# Patient Record
Sex: Female | Born: 1935
Health system: Southern US, Community
[De-identification: ages and names within clinical notes are randomized; demographics above are authoritative.]

## PROBLEM LIST (undated history)

## (undated) DIAGNOSIS — C801 Malignant (primary) neoplasm, unspecified: Secondary | ICD-10-CM

## (undated) DIAGNOSIS — I773 Arterial fibromuscular dysplasia: Secondary | ICD-10-CM

## (undated) DIAGNOSIS — I341 Nonrheumatic mitral (valve) prolapse: Secondary | ICD-10-CM

## (undated) DIAGNOSIS — I10 Essential (primary) hypertension: Secondary | ICD-10-CM

## (undated) DIAGNOSIS — E079 Disorder of thyroid, unspecified: Secondary | ICD-10-CM

## (undated) HISTORY — PX: PARATHYROIDECTOMY: SHX19

## (undated) HISTORY — PX: TONSILLECTOMY: SUR1361

## (undated) HISTORY — DX: Nonrheumatic mitral (valve) prolapse: I34.1

## (undated) HISTORY — PX: APPENDECTOMY: SHX54

## (undated) HISTORY — DX: Disorder of thyroid, unspecified: E07.9

## (undated) HISTORY — DX: Arterial fibromuscular dysplasia: I77.3

## (undated) HISTORY — PX: ABDOMINAL HYSTERECTOMY: SHX81

---

## 2001-06-15 ENCOUNTER — Ambulatory Visit (HOSPITAL_COMMUNITY): Admission: RE | Admit: 2001-06-15 | Discharge: 2001-06-15 | Payer: Self-pay | Admitting: Gastroenterology

## 2001-06-15 ENCOUNTER — Encounter: Payer: Self-pay | Admitting: Gastroenterology

## 2002-10-13 ENCOUNTER — Other Ambulatory Visit: Admission: RE | Admit: 2002-10-13 | Discharge: 2002-10-13 | Payer: Self-pay | Admitting: Obstetrics & Gynecology

## 2002-11-12 ENCOUNTER — Encounter: Admission: RE | Admit: 2002-11-12 | Discharge: 2002-12-09 | Payer: Self-pay | Admitting: Internal Medicine

## 2002-11-16 ENCOUNTER — Ambulatory Visit (HOSPITAL_COMMUNITY): Admission: RE | Admit: 2002-11-16 | Discharge: 2002-11-16 | Payer: Self-pay | Admitting: Obstetrics & Gynecology

## 2002-11-16 ENCOUNTER — Encounter: Payer: Self-pay | Admitting: Obstetrics & Gynecology

## 2003-01-04 ENCOUNTER — Ambulatory Visit: Admission: RE | Admit: 2003-01-04 | Discharge: 2003-01-04 | Payer: Self-pay | Admitting: Gynecology

## 2003-11-17 ENCOUNTER — Ambulatory Visit (HOSPITAL_COMMUNITY): Admission: RE | Admit: 2003-11-17 | Discharge: 2003-11-17 | Payer: Self-pay | Admitting: Obstetrics & Gynecology

## 2003-12-23 ENCOUNTER — Encounter: Admission: RE | Admit: 2003-12-23 | Discharge: 2003-12-23 | Payer: Self-pay | Admitting: Internal Medicine

## 2004-07-04 ENCOUNTER — Ambulatory Visit: Payer: Self-pay | Admitting: *Deleted

## 2004-07-11 ENCOUNTER — Ambulatory Visit: Payer: Self-pay

## 2004-11-28 ENCOUNTER — Ambulatory Visit: Payer: Self-pay | Admitting: Cardiology

## 2005-01-14 ENCOUNTER — Ambulatory Visit (HOSPITAL_COMMUNITY): Admission: RE | Admit: 2005-01-14 | Discharge: 2005-01-14 | Payer: Self-pay | Admitting: Internal Medicine

## 2005-03-06 ENCOUNTER — Encounter
Admission: RE | Admit: 2005-03-06 | Discharge: 2005-03-06 | Payer: Self-pay | Admitting: Physical Medicine and Rehabilitation

## 2005-06-12 ENCOUNTER — Ambulatory Visit: Payer: Self-pay | Admitting: Cardiology

## 2005-11-25 ENCOUNTER — Ambulatory Visit: Payer: Self-pay | Admitting: Cardiology

## 2006-02-20 ENCOUNTER — Encounter: Payer: Self-pay | Admitting: Cardiology

## 2006-02-20 ENCOUNTER — Ambulatory Visit: Payer: Self-pay

## 2006-02-26 ENCOUNTER — Ambulatory Visit: Payer: Self-pay | Admitting: Cardiology

## 2006-05-23 ENCOUNTER — Encounter: Admission: RE | Admit: 2006-05-23 | Discharge: 2006-05-23 | Payer: Self-pay | Admitting: Internal Medicine

## 2007-01-28 ENCOUNTER — Encounter: Admission: RE | Admit: 2007-01-28 | Discharge: 2007-01-28 | Payer: Self-pay | Admitting: Internal Medicine

## 2007-05-18 ENCOUNTER — Ambulatory Visit: Payer: Self-pay | Admitting: Cardiology

## 2007-05-18 LAB — CONVERTED CEMR LAB
Chloride: 105 meq/L (ref 96–112)
GFR calc Af Amer: 91 mL/min
GFR calc non Af Amer: 75 mL/min
Potassium: 3.8 meq/L (ref 3.5–5.1)
Sodium: 143 meq/L (ref 135–145)

## 2007-11-26 ENCOUNTER — Ambulatory Visit: Payer: Self-pay | Admitting: Cardiology

## 2008-02-23 ENCOUNTER — Ambulatory Visit: Payer: Self-pay | Admitting: Cardiology

## 2008-09-26 ENCOUNTER — Encounter (INDEPENDENT_AMBULATORY_CARE_PROVIDER_SITE_OTHER): Payer: Self-pay | Admitting: *Deleted

## 2008-12-13 ENCOUNTER — Telehealth (INDEPENDENT_AMBULATORY_CARE_PROVIDER_SITE_OTHER): Payer: Self-pay | Admitting: *Deleted

## 2008-12-14 ENCOUNTER — Ambulatory Visit: Payer: Self-pay | Admitting: Cardiology

## 2008-12-14 ENCOUNTER — Ambulatory Visit: Payer: Self-pay

## 2008-12-14 ENCOUNTER — Encounter (HOSPITAL_COMMUNITY): Admission: RE | Admit: 2008-12-14 | Discharge: 2009-02-10 | Payer: Self-pay | Admitting: Cardiology

## 2009-01-05 DIAGNOSIS — I059 Rheumatic mitral valve disease, unspecified: Secondary | ICD-10-CM | POA: Insufficient documentation

## 2009-01-05 DIAGNOSIS — R079 Chest pain, unspecified: Secondary | ICD-10-CM | POA: Insufficient documentation

## 2009-01-05 DIAGNOSIS — R002 Palpitations: Secondary | ICD-10-CM | POA: Insufficient documentation

## 2009-01-06 ENCOUNTER — Ambulatory Visit: Payer: Self-pay | Admitting: Cardiology

## 2009-03-08 ENCOUNTER — Encounter: Admission: RE | Admit: 2009-03-08 | Discharge: 2009-03-08 | Payer: Self-pay | Admitting: Internal Medicine

## 2009-11-28 ENCOUNTER — Telehealth: Payer: Self-pay | Admitting: Cardiology

## 2009-12-12 ENCOUNTER — Ambulatory Visit: Payer: Self-pay | Admitting: Cardiology

## 2010-01-08 ENCOUNTER — Encounter: Payer: Self-pay | Admitting: Cardiology

## 2010-04-01 ENCOUNTER — Encounter: Payer: Self-pay | Admitting: Obstetrics & Gynecology

## 2010-04-10 NOTE — Assessment & Plan Note (Signed)
Summary: rov. mvp/ per kelly on 9/20 / gd   CC:  pt complains of anxiety.  History of Present Illness: Ms. Ashley Washington is a pleasant female, who has a history of palpitations, fibromuscular dysplasia of her right renal artery, and chest pain.  Most recent Myoview in October of 2010 showed normal LV function and normal perfusion.  Her last renal Dopplers in February 2009 showed mildly elevated velocities on the right with stenosis equal to or less than 60% and normal left. Last echocardiogram in December 2007 showed normal LV function, mild left atrial enlargement and a small pericardial effusion. There is mild tricuspid regurgitation. I last saw her in October of 2010. Since then she describes feelings of "anxiety" intermittently. She finds this difficult to describe. She does not have dyspnea on exertion, orthopnea, PND, pedal edema. She occasionally feels pain on the left side of her chest when she lays on that side. She does not have exertional chest pain. She wonders whether her atenolol is causing anxiety.  Current Medications (verified): 1)  Atenolol 25 Mg Tabs (Atenolol) .... Take One Tablet By Mouth Daily 2)  Co Q-10 30 Mg  Caps (Coenzyme Q10) .Marland Kitchen.. 1 Tab By Mouth Once Daily 3)  Multivitamins   Tabs (Multiple Vitamin) .Marland Kitchen.. 1 Tab By Mouth Once Daily 4)  Vitamin C 500 Mg Tabs (Ascorbic Acid) .... Tab By Mouth Once Daily 5)  Omega-3 350 Mg Caps (Omega-3 Fatty Acids) .Marland Kitchen.. 1 Tab By Mouth Once Daily 6)  Calcium 500 Mg Tabs (Calcium Carbonate) .Marland Kitchen.. 1 Tab By Mouth Once Daily 7)  Vitamin D 400 Unit Caps (Cholecalciferol) .Marland Kitchen.. 1 Tab By Mouth Once Daily  Allergies: No Known Drug Allergies  Past History:  Past Medical History: MITRAL VALVE PROLAPSE (ICD-424.0) fibromuscular dysplasia of renal artery left kidney compromised by large cyst.   Social History: Reviewed history from 01/06/2009 and no changes required.  The patient is married.  Her husband has recurrent prostate  cancer and is  currently undergoing hormonal therapy. Tobacco Use - No.   Review of Systems       no fevers or chills, productive cough, hemoptysis, dysphasia, odynophagia, melena, hematochezia, dysuria, hematuria, rash, seizure activity, orthopnea, PND, pedal edema, claudication. Remaining systems are negative.   Vital Signs:  Patient profile:   77 year old female Height:      64 inches Weight:      118 pounds BMI:     20.33 Pulse rate:   63 / minute Resp:     12 per minute BP sitting:   120 / 70  (left arm)  Vitals Entered By: Kem Parkinson (December 12, 2009 9:57 AM)  Physical Exam  General:  Well-developed well-nourished in no acute distress.  Anxious appearing Skin is warm and dry.  HEENT is normal.  Neck is supple. No thyromegaly.  Chest is clear to auscultation with normal expansion.  Cardiovascular exam is regular rate and rhythm.  Abdominal exam nontender or distended. No masses palpated. Extremities show no edema. neuro grossly intact    EKG  Procedure date:  12/12/2009  Findings:      Sinus rhythm at a rate 63. First Degree AV Block. Cannot Rule Out Prior anterior Infarct Versus Lead Reversal. No ST Changes.  Impression & Recommendations:  Problem # 1:  RENAL ARTERY STENOSIS (ICD-440.1) Repeat renal Dopplers. Orders: Renal Artery Duplex (Renal Artery Duplex)  Problem # 2:  MITRAL VALVE PROLAPSE (ICD-424.0) Repeat echocardiogram. Her updated medication list for this problem includes:  Atenolol 25 Mg Tabs (Atenolol) .Marland Kitchen... Take one tablet by mouth daily  Her updated medication list for this problem includes:    Atenolol 25 Mg Tabs (Atenolol) .Marland Kitchen... Take one tablet by mouth daily  Problem # 3:  PALPITATIONS (ICD-785.1) She is concerned that the atenolol may be causing feelings of anxiety. I explained that this is not a typical side effect. We discussed discontinuing that medication and substituting a different beta blocker. I explained that discontinuing beta  blockers may cause worsening palpitations. For now we will continue with her atenolol. Her updated medication list for this problem includes:    Atenolol 25 Mg Tabs (Atenolol) .Marland Kitchen... Take one tablet by mouth daily  Orders: EKG w/ Interpretation (93000) Echocardiogram (Echo)  Problem # 4:  CHEST PAIN (ICD-786.50) Symptoms atypical. No further ischemia evaluation. Her updated medication list for this problem includes:    Atenolol 25 Mg Tabs (Atenolol) .Marland Kitchen... Take one tablet by mouth daily  Patient Instructions: 1)  Your physician recommends that you schedule a follow-up appointment in: year with dr Jens Som 2)  Your physician recommends that you continue on your current medications as directed. Please refer to the Current Medication list given to you today. 3)  Your physician has requested that you have a renal artery duplex. During this test, an ultrasound is used to evaluate blood flow to the kidneys. Allow one hour for this exam. Do not eat after midnight the day before and avoid carbonated beverages. Take your medications as you usually do. 4)  Your physician has requested that you have an echocardiogram.  Echocardiography is a painless test that uses sound waves to create images of your heart. It provides your doctor with information about the size and shape of your heart and how well your heart's chambers and valves are working.  This procedure takes approximately one hour. There are no restrictions for this procedure.

## 2010-04-10 NOTE — Progress Notes (Signed)
Summary: had rapid heartbeats this am  Phone Note Call from Patient   Caller: Patient [phone] Reason for Call: Talk to Nurse Summary of Call: rapid heartbeats this am about 5am, has subsided no other symptoms, but wants an appt today or tomorrow, will be going out of town and wants to make sure she's alright-pls advise Initial call taken by: Glynda Jaeger,  November 28, 2009 8:45 AM  Follow-up for Phone Call        Tierd feeling in chest and anxious feeling over the last couple of weeks.  Fast heart beat this morning that lasted .  Got up walked around went back to bed dozed off relaxed and felt ok.  Going out of town Magdalene Molly will be back next [place] and is due follow up.  Will see PA tomorow and Nishan on 12/12/09. She requested to see PA tomorrow i felt she would be ok until 12/12/09 Dennis Bast, RN, BSN  November 28, 2009 11:48 AM

## 2010-04-10 NOTE — Miscellaneous (Signed)
Summary: Appointment Canceled  Appointment status changed to canceled by [employer] on 01/08/2010 10:12 AM.  Cancellation Comments --------------------- ZOXW/960.1/MCR/NO PREC. REQ/RENAL @ 10:00/SZAF  Appointment Information ----------------------- Appt Type:  CARDIOLOGY ANCILLARY VISIT      Date:  Wednesday, January 10, 2010      Time:  10:30 AM for 60 min   Urgency:  Routine   Made By:  Pearson Grippe  To Visit:  [id number]    Reason:  AVWU/981.1/MCR/NO PREC. REQ/RENAL @ 10:00/SZAF  Appt Comments ------------- -- 01/08/10 10:12: (CEMR) CANCELED -- XBJY/782.1/MCR/NO PREC. REQ/RENAL @ 10:00/SZAF -- 12/12/09 11:39: (CEMR) BOOKED -- Routine CARDIOLOGY ANCILLARY VISIT at 01/10/2010 10:30 AM for 60 min ECHO/785.1/MCR/NO PREC. REQ/RENAL @ 10:00/SZAF -- 12/12/09 10:

## 2010-04-11 ENCOUNTER — Encounter: Payer: Self-pay | Admitting: Internal Medicine

## 2010-07-24 NOTE — Assessment & Plan Note (Signed)
[facility]                            CARDIOLOGY OFFICE NOTE   Ashley Washington, Ashley Washington                    MRN:          [id number]  DATE:05/18/2007                            DOB:          [birth year]    HISTORY OF PRESENT ILLNESS:  Ashley Washington is a very pleasant 77 year old  female who I have seen the past for palpitations, fibromuscular  dysplasia of her right renal artery and chest pain.  She did have a  Myoview performed on March 17, 2003.  There was no ischemia and her  ejection fraction was 70%.  She also had an echocardiogram last  performed on February 20, 2006.  LV function was normal.  The left  atrium was mildly dilated and there was mild tricuspid regurgitation.  There was a small pericardial effusion.  Her most recent renal Dopplers  were performed on May 07, 2007.  The right mid renal artery and  elevated velocity consistent with equal to or less than 60% reduction in  the left renal artery was normal.  Since that time, she is doing  reasonably well.  She denies any dyspnea, chest pain or pedal edema.  She does occasionally have palpitations but these are controlled with  atenolol.  We had discussed trying Toprol in the past, but she instead  continue with her atenolol.  She does state that she has had difficulty  sleeping at times and wonders whether it may be related to her atenolol.   MEDICATIONS:  1. Atenolol 25 mg p.o. daily.  2. Vitamin C 500 mg daily.  3. Coenzyme Q.  4. Multivitamin.  5. Vitamin D.  6. Omega-3.  7. Calcium.   PHYSICAL EXAMINATION:  VITAL SIGNS:  Blood pressure of 130/80 and pulse  is 63.  She weighs 124 pounds.  HEENT:  Normal.  NECK:  Supple.  CHEST:  Clear.  CARDIOVASCULAR:  Regular rate and rhythm.  There is 2/6 systolic  ejection murmur at the left sternal border.  ABDOMEN:  Shows no tenderness.  EXTREMITIES:  Show no edema.   STUDIES:  Echocardiogram shows a sinus rhythm at a rate of 63.   The axis  is normal.  There are no ST changes noted.   DIAGNOSES:  1. Palpitations.  Ashley Washington will continue on atenolol 25 mg p.o.      daily.  We did discuss changing beta-blockers to see if this would      improve her sleep patterns.  However, she states that the atenolol      is helping with her palpitations, and would like to continue with      that.  We will therefore make no changes and continue with 25 mg      p.o. daily.  She will contact us if she would be interested in      doing that.  2. Fibromuscular dysplasia of her right renal artery.  We will check a      B-met today.  We will plan to repeat her renal Dopplers in one      year.  3. History of left kidney compromised  by a large cyst.  4. History of chest pain.  This continues, but only when she lies on      her left side.  It does not sound cardiac in etiology.   I will see her back in one year.     Madolyn Frieze Jens Som, MD, [facility]  Electronically Signed    BSC/MedQ  DD: 05/18/2007  DT: 05/19/2007  Job #: [id number]

## 2010-07-24 NOTE — Assessment & Plan Note (Signed)
[facility]                            CARDIOLOGY OFFICE NOTE   BRYCELYNN, STAMPLEY                    MRN:          [id number]  DATE:11/26/2007                            DOB:          1933-02-19    Mrs. Rahm is a very pleasant female that I follow for history of  palpitations, fibromuscular dysplasia of her right renal artery, and  history of chest pain.  Her most recent Myoview was performed on March 17, 2003.  At that time her ejection fraction was 60%.  There was normal  perfusion.  Her last renal Doppler was performed on May 07, 2007.  She was found to have mildly elevated velocity consistent will equal to  or less than 60% diameter reduction on the right and normal left.  Her  last echo in December 2007 showed normal LV function and trivial mitral  regurgitation.  There was a small pericardial effusion.  Since I last  saw her, she has had 2 separate episodes of pain in her right lower  chest area and rib area.  The pain is described as a sharp pain.  It  does not occur with exertion nor is it pleuritic.  It does not occur  after eating.  There is no associated nausea, vomiting, shortness of  breath, or diaphoresis.  It last approximately 5 minutes.  It radiates  to the right shoulder.  She is not having exertional chest pain or is  she have dyspnea on exertion.  She is not having any colic stools.   Medications include vitamin C, coenzyme Q, multivitamin, vitamin D,  omega-3, calcium, and atenolol 25 mg p.o. daily.   PHYSICAL EXAMINATION:  VITAL SIGNS:  Blood pressure of 135/73 and pulse  is 65.  HEENT:  Normal.  NECK:  Supple.  CHEST:  Clear.  CARDIOVASCULAR:  Regular rhythm.  ABDOMEN:  Shows no tenderness.  There is no tenderness in the right  upper quadrant.  EXTREMITIES:  Show no edema.   Her electrocardiogram shows a sinus rhythm at a rate of 64.  There is a  first-degree AV block.  There are no ST changes noted.   DIAGNOSES:  1. Atypical chest pain - Mrs. Quirino's symptoms are atypical.  We      will schedule her to have a Myoview for risk stratification.  I      also considered possible gallbladder disease, but she is not here      in the right upper quadrant.  This could be evaluated further if      her symptoms persist.  2. History of palpitations - she will continue on her atenolol.  3. Fibromuscular dysplasia of right renal artery - she will need      followup renal Dopplers in February.  Note, her renal function has      been normal.  4. History of left kidney compromised by large cyst.   We will see her back in 1 year.     Madolyn Frieze Jens Som, MD, [facility]  Electronically Signed    BSC/MedQ  DD: 11/26/2007  DT: 11/27/2007  Job #:  [id number] 

## 2010-07-24 NOTE — Assessment & Plan Note (Signed)
[facility]                            CARDIOLOGY OFFICE NOTE   EGAN, SAHLIN                    MRN:          [id number]  DATE:02/23/2008                            DOB:          04-Dec-1932    Ms. Verdi is a pleasant female, who has a history of palpitations,  fibromuscular dysplasia of her right renal artery, and chest pain.  Most  recent Myoview in January 2005 showed normal perfusion and normal LV  function.  Her last renal Dopplers in February 2009 showed mildly  elevated velocities on the right with stenosis equal to or less than 60%  and normal left.  Since I last saw her, she has done reasonably well.  However, over the past 2 days, she has had chest pain.  It is in the  left chest area.  It is described as ache.  The pain increases with  certain movements.  She did notice that when she felt like she pulled  something in her back as well.  Note, it is not associated with  shortness of breath, nausea, vomiting, or diaphoresis.  It does not  radiate.  She otherwise has not had exertional chest pain nor did she  have dyspnea on exertion, orthopnea, PND, or increased pedal edema.   Her medications at present include:  1. Vitamin C.  2. Coenzyme Q.  3. Multivitamin.  4. Vitamin D.  5. Omega-3.  6. Calcium.  7. Atenolol 25 mg p.o. daily.   PHYSICAL EXAMINATION:  VITAL SIGNS:  Blood pressure of 106/66 and a  pulse of 65.  She weighs 121 pounds.  HEENT:  Normal.  NECK:  Supple.  CHEST:  Clear.  CARDIOVASCULAR:  Regular rate and rhythm.  Her chest pain is reproduced  with palpation.  ABDOMEN:  No tenderness.  EXTREMITIES:  No edema.   Electrocardiogram shows sinus rhythm at a rate of 65.  The axis is  normal.  A prior inferior infarct cannot be excluded.   DIAGNOSES:  1. Atypical chest pain - Ms. Brobeck's symptoms are most consistent      with musculoskeletal pain.  We will treat her with a nonsteroidal.      We will also  schedule her to have a Myoview as she did not show for      her previous one which was scheduled in September.  If it is      normal, we will not pursue further cardiac workup.  2. History of palpitations - she will continue on atenolol.  3. Fibromuscular dysplasia of the right renal artery.  4. History of left kidney compromised by large cyst.   We will see her back in 1 year.     Madolyn Frieze Jens Som, MD, [facility]  Electronically Signed    BSC/MedQ  DD: 02/23/2008  DT: 02/24/2008  Job #: [id number]

## 2010-07-27 NOTE — Assessment & Plan Note (Signed)
[facility]                            CARDIOLOGY OFFICE NOTE   Ashley Washington, Ashley Washington                    MRN:          [id number]  DATE:02/26/2006                            DOB:          [birth year]    Ashley Washington returns for followup today.  She has a history of question  of mitral valve prolapse and recurrent palpitations.  Since I last saw  her, she is having worse palpitations.  These occur at night and are  described as skipping.  She also has pain in her chest when she lies  on her left side.  These have been going on for years.  There is no  syncope.  She does not have exertional chest pain or dyspnea on  exertion.   MEDICATIONS:  Vitamin C, coenzyme Q, multivitamin, vitamin D, omega-3,  calcium, Tenormin 25 mg p.o. daily.   PHYSICAL EXAMINATION:  Shows a blood pressure of 131/78.  Her pulse is  73.  She weighs 128 pounds.  NECK:  Supple with no bruits.  CHEST:  Clear.  CARDIOVASCULAR EXAM:  Regular rate and rhythm.  EXTREMITIES:  Show no edema.  An echocardiogram on February 20, 2006 showed normal LV function.  There  was trivial mitral regurgitation and mild left atrial enlargement.  There was a small pericardial effusion.   An EKG today shows a sinus rhythm at a rate of 66 with no ST changes.   DIAGNOSES:  1. Continued palpitations.  2. Normal left ventricular function.  3. Fibromuscular dysplasia of her right renal artery.  4. History of left kidney compromise by large cyst.  5. Chest pain.   PLAN:  Ashley Washington is having worsening palpitations.  However, she does  not want to increase her Atenolol, as she is concerned about potential  side effects.  She asked about other potential beta blockers, and we  will try Toprol 25 mg p.o. daily to see if she tolerates this better and  improves her palpitations.  I have asked her to also take this at night  to see if this will improve potential side effects.  Her echocardiogram  shows  preserved LV function.  We will increase her medicines as  tolerated.  If her palpitations persist, then we will plan to proceed  with a CardioNet  monitor.  She will need followup renal Dopplers in September 2008.  I  will see her back in 6 months.     Madolyn Frieze Jens Som, MD, [facility]  Electronically Signed    BSC/MedQ  DD: 02/26/2006  DT: 02/26/2006  Job #: [id number]   cc:   Loraine Leriche A. Perini, M.D.

## 2010-07-27 NOTE — Consult Note (Signed)
**Note Ashley-Identified via Obfuscation** Ashley Washington, Ashley Washington                       ACCOUNT NO.:  [id number]   MEDICAL RECORD NO.:  [id number]                   PATIENT TYPE:  OUT   LOCATION:  GYN                                  FACILITY:  [facility]   PHYSICIAN:  Ashley Washington, M.D.         DATE OF BIRTH:  May 27, 1932   DATE OF CONSULTATION:  01/04/2003  DATE OF DISCHARGE:                                   CONSULTATION   This is a 77 year old white married female seen in consultation at the  request of Ashley Washington regarding ovarian cysts.   HISTORY OF PRESENT ILLNESS:  The patient has had ovarian cysts over the past  two years and has been followed with serial ultrasounds and CA125 values.  The CA125 values have ranged from 5-7 units/mL.  The most recent ultrasound  of the ovary obtained on November 16, 2002, showed a normal right ovary, a 1  cm right paraovarian cyst, and a 5 x 4.3 x 3.6 cm left ovarian cyst which  contained a single thin septation and three tiny mural nodules.  Doppler  analysis of the blood flow was normal.  The patient has no pelvic pain or  pressure, GI or GU symptoms.  She does have some low back pain associated  with scoliosis and arthritis.  She has no family history of ovarian cancer.   PAST MEDICAL ILLNESSES:  1. Osteoporosis.  2. Mitral valve prolapse (prophylactic antibiotics are recommended).  3. Scoliosis.   PAST SURGICAL HISTORY:  1. Total abdominal hysterectomy in 1977 for uterine fibroids.  2. In 1991, resection of parathyroid gland.  3. Tonsillectomy and adenoidectomy.  4. Appendectomy.   DRUG ALLERGIES:  None.   CURRENT MEDICATIONS:  1. Climara patch 0.05 mg.  2. Atenolol.   FAMILY HISTORY:  Negative for gynecologic, breast, or colon cancer.   SOCIAL HISTORY:  The patient is married.  Her husband has recurrent prostate  cancer and is currently undergoing hormonal therapy.   PHYSICAL EXAM:  VITAL SIGNS:  Height 5 feet 4, weight 125 pounds.  Blood  pressure  138/78, pulse 60, respiratory rate 16.  GENERAL:  The patient is a healthy white female in no acute distress.  HEENT:  Negative.  NECK:  Supple.  Without thyromegaly.  There is no supraclavicular or  inguinal adenopathy.  ABDOMEN:  Soft, nontender.  No masses, organomegaly, ascites, or hernias are  noted.  She has a well-healed appendectomy scar and low Pfannenstiel scar.  PELVIC:  EGBUS, vagina, bladder, urethra are normal.  Cervix and uterus  surgically absent.  Bimanual and rectovaginal exams reveal no masses,  induration, or nodularity.   IMPRESSION:  Ovarian cyst, outlined above, which has only changed slightly  over the past two years.   I had a lengthy discussion with the patient regarding the potential  etiologies for this ovarian cyst.  I believe it is a benign ovarian neoplasm  which is asymptomatic.  The options of surgical  resection versus observation  were discussed at length.  Given that I believe this is most likely benign,  I would feel comfortable following it, and the patient certainly indicates  strongly that she would prefer to avoid surgery if at all possible.  I  would, therefore, recommend she have repeat ultrasound in one year and have  repeat CA125 at that time.   We also had a lengthy discussion regarding her hormone replacement therapy,  the pros and cons and benefits.  Basically, she is unable to tolerate her  life without having estrogen in her system.  In addition, she is aware of  the benefits of reducing osteoporosis, vaginal atrophy, and a general sense  of well being.   Finally, if the patient did come to surgery, I have recommended that she  reconsult Ashley Washington regarding management of her incontinence as well.   The patient will return to the care of Ashley Washington, but I would be happy to see  her again in the future.                                               Ashley Washington, M.D.    DC/MEDQ  D:  01/04/2003  T:  01/04/2003  Job:   [id number]   cc:   Ashley Washington, M.D.  [address]  [place]  [place] [address]  Fax: [phone]   Ashley Washington, R.N.  [phone] [address]  [place], [place] [address]   Ashley Washington, M.D.  [address]  [place]  [place] [address]  Fax: [phone]

## 2010-07-27 NOTE — Assessment & Plan Note (Signed)
[facility]                              CARDIOLOGY OFFICE NOTE   Ashley Washington                    MRN:          [id number]  DATE:11/25/2005                            DOB:          Feb 20, 1933    Ashley Washington is a pleasant female who has a history of mitral valve prolapse  and palpitations.  Since I last saw her, her palpitations have improved.  I  know we had discussed discontinuing her atenolol but she did change to  Tenormin and apparently is tolerating this better with less fatigue.  She  also has had chest x-rays that showed question of small effusions or pleural  thickening.  She denies any dyspnea on exertion, orthopnea or PND, pedal  edema, presyncope, syncope.  She has not had exertional chest pain and she  exercises routinely.  She did have some vague discomfort in her chest  several months ago of unclear etiology.   MEDICATIONS:  At present, include:  1. Vitamin C.  2. Coenzyme-Q.  3. Multivitamin.  4. Vitamin D.  5. Omega-3.  6. Calcium.  7. Tenormin 25 mg p.o. daily.   PHYSICAL EXAM:  Shows a blood pressure of 130/72 and a pulse of 59.  She  weighs 125 pounds.  NECK:  Supple and I cannot appreciate bruits.  CHEST:  Clear.  CARDIOVASCULAR:  Exam shows a regular rate and rhythm.  There is a 2/6  systolic murmur at the apex.  ABDOMEN:  Benign.  EXTREMITIES:  Show no edema.   Her electrocardiogram shows a normal sinus rhythm at a rate of 59.  There is  a first degree AV block.  There were no ST changes noted.   DIAGNOSES:  1. History of palpitations.  2. History of preserved left ventricular function.  3. History of mild pulmonary hypertension.  4. History of fibromuscular dysplasia of her renal artery.  5. History of left kidney compromise by large cyst.  6. Chronic palpitations.   PLAN:  [name] has improved from a palpitation standpoint and we will  continue with her present dose of Tenormin.  She was  concerned about the  possibility of small bilateral effusions but there is no dyspnea and I did  review the chest x-ray today and I am not convinced she has effusions.  We  will check a BMET today to follow her potassium and renal function as well  as a BNP.  She will need follow up renal Dopplers in approximately 1 year.  We did discuss the possibility of a stress test today for her chest pain.  However, her symptoms are somewhat vague and nonspecific and she exercises  routinely without chest pain.  Electrocardiogram was also normal.  We have  elected to continue with close observation.  Note she did have a Myoview in  January 2005 that showed normal perfusion and normal LV function.  She will  see [place] back in 12 months or sooner if necessary.  Madolyn Frieze Jens Som, MD, [facility]    BSC/MedQ  DD:  11/25/2005  DT:  11/26/2005  Job #:  [id number]   cc:   Loraine Leriche A. Perini, M.D.

## 2010-10-04 ENCOUNTER — Telehealth: Payer: Self-pay | Admitting: Cardiology

## 2010-10-04 DIAGNOSIS — I701 Atherosclerosis of renal artery: Secondary | ICD-10-CM

## 2010-10-04 DIAGNOSIS — I059 Rheumatic mitral valve disease, unspecified: Secondary | ICD-10-CM

## 2010-10-04 NOTE — Telephone Encounter (Signed)
Pt calling to say she failed to follow through with an echo and wanted to rs , I don't see an order for an echo, can he get one

## 2010-10-04 NOTE — Telephone Encounter (Signed)
Spoke with pt, when she was last seen by dr Jens Som, he ordered an echo and renal artery dopplers. The pt never had this testing done. She is now having swelling in her ankles she has never had before. She also states she is more fatiqued than usual. She wants to know if she needs to get the ultasounds done or be seen. Will forward for dr Jens Som review Ashley Washington

## 2010-10-04 NOTE — Telephone Encounter (Signed)
Proceed with studies as previously ordered and then fu. Ashley Washington

## 2010-10-10 ENCOUNTER — Other Ambulatory Visit (HOSPITAL_COMMUNITY): Payer: Self-pay | Admitting: Obstetrics & Gynecology

## 2010-10-10 ENCOUNTER — Telehealth: Payer: Self-pay | Admitting: Cardiology

## 2010-10-10 DIAGNOSIS — Z1231 Encounter for screening mammogram for malignant neoplasm of breast: Secondary | ICD-10-CM

## 2010-10-10 NOTE — Telephone Encounter (Signed)
Spoke with pt, renals and echo scheduled and then f/u with crenshaw Ashley Washington

## 2010-10-10 NOTE — Telephone Encounter (Signed)
See phone note Ashley Washington

## 2010-10-10 NOTE — Telephone Encounter (Signed)
Per pt call, pt returning call to [facility]. Please return pt call.

## 2010-10-17 ENCOUNTER — Encounter: Payer: Self-pay | Admitting: Cardiology

## 2010-10-19 ENCOUNTER — Ambulatory Visit (HOSPITAL_COMMUNITY)
Admission: RE | Admit: 2010-10-19 | Discharge: 2010-10-19 | Disposition: A | Discharge status: Home / Self Care | Payer: Medicare Other | Source: Ambulatory Visit | Attending: Obstetrics & Gynecology | Admitting: Obstetrics & Gynecology

## 2010-10-19 DIAGNOSIS — Z1231 Encounter for screening mammogram for malignant neoplasm of breast: Secondary | ICD-10-CM | POA: Insufficient documentation

## 2010-11-01 ENCOUNTER — Encounter (INDEPENDENT_AMBULATORY_CARE_PROVIDER_SITE_OTHER): Payer: Medicare Other | Admitting: Cardiology

## 2010-11-01 ENCOUNTER — Ambulatory Visit (HOSPITAL_COMMUNITY): Payer: Medicare Other | Attending: Cardiology | Admitting: Radiology

## 2010-11-01 DIAGNOSIS — I059 Rheumatic mitral valve disease, unspecified: Secondary | ICD-10-CM | POA: Insufficient documentation

## 2010-11-01 DIAGNOSIS — I701 Atherosclerosis of renal artery: Secondary | ICD-10-CM

## 2010-11-01 DIAGNOSIS — R5381 Other malaise: Secondary | ICD-10-CM | POA: Insufficient documentation

## 2010-11-01 DIAGNOSIS — I079 Rheumatic tricuspid valve disease, unspecified: Secondary | ICD-10-CM | POA: Insufficient documentation

## 2010-11-01 DIAGNOSIS — R5383 Other fatigue: Secondary | ICD-10-CM | POA: Insufficient documentation

## 2010-11-01 DIAGNOSIS — I700 Atherosclerosis of aorta: Secondary | ICD-10-CM

## 2010-11-01 DIAGNOSIS — M7989 Other specified soft tissue disorders: Secondary | ICD-10-CM | POA: Insufficient documentation

## 2010-11-05 ENCOUNTER — Telehealth: Payer: Self-pay | Admitting: Cardiology

## 2010-11-05 NOTE — Telephone Encounter (Signed)
Returning call back. 

## 2010-11-07 NOTE — Telephone Encounter (Signed)
Spoke with pt, aware of test results Ashley Washington  

## 2010-11-14 ENCOUNTER — Ambulatory Visit (INDEPENDENT_AMBULATORY_CARE_PROVIDER_SITE_OTHER): Payer: Medicare Other | Admitting: Cardiology

## 2010-11-14 ENCOUNTER — Encounter: Payer: Self-pay | Admitting: Cardiology

## 2010-11-14 VITALS — BP 121/69 | HR 58 | Resp 12 | Ht 66.0 in | Wt 115.0 lb

## 2010-11-14 DIAGNOSIS — R002 Palpitations: Secondary | ICD-10-CM

## 2010-11-14 DIAGNOSIS — I701 Atherosclerosis of renal artery: Secondary | ICD-10-CM

## 2010-11-14 DIAGNOSIS — R079 Chest pain, unspecified: Secondary | ICD-10-CM

## 2010-11-14 DIAGNOSIS — I059 Rheumatic mitral valve disease, unspecified: Secondary | ICD-10-CM

## 2010-11-14 NOTE — Assessment & Plan Note (Signed)
Her symptoms not consistent with ischemia. They have been present previously as well. No further workup.

## 2010-11-14 NOTE — Progress Notes (Signed)
HPI: Ashley Washington is a pleasant female, who has a history of palpitations, fibromuscular dysplasia of her right renal artery, and chest pain.  Most recent Myoview in October of 2010 showed normal LV function and normal perfusion.  Her last renal Dopplers in August of 2012 showed 1-59% on the right and normal left. Large cyst in left kidney. Last echocardiogram in August of 2012 showed normal LV function, mild to moderate TR and a trivial pericardial effusion. I last saw her in October of 2011.  Since then there is no dyspnea on exertion, orthopnea, PND or pedal edema. She continues to feel an occasional skip no sustained palpitations. She has occasional pain with laying on her left side but no exertional chest pain.  Current Outpatient Prescriptions  Medication Sig Dispense Refill  . atenolol (TENORMIN) 25 MG tablet 1/2 tab po qd      . calcium carbonate (TUMS - DOSED IN MG ELEMENTAL CALCIUM) 500 MG chewable tablet Chew 1 tablet by mouth daily.        Marland Kitchen co-enzyme Q-10 30 MG capsule Take 30 mg by mouth daily.        . Multiple Vitamin (MULTIVITAMIN PO) Take 1 capsule by mouth daily.        . Omega-3 350 MG CAPS Take 1 capsule by mouth daily.        . vitamin C (ASCORBIC ACID) 500 MG tablet Take 500 mg by mouth daily.           Past Medical History  Diagnosis Date  . Mitral valve prolapse   . Fibromuscular dysplasia of renal artery     No past surgical history on file.  History   Social History  . Marital Status: Married    Spouse Name: N/A    Number of Children: N/A  . Years of Education: N/A   Occupational History  . Not on file.   Social History Main Topics  . Smoking status: Never Smoker   . Smokeless tobacco: Not on file  . Alcohol Use: Not on file  . Drug Use: Not on file  . Sexually Active: Not on file   Other Topics Concern  . Not on file   Social History Narrative  . No narrative on file    ROS: no fevers or chills, productive cough, hemoptysis, dysphasia,  odynophagia, melena, hematochezia, dysuria, hematuria, rash, seizure activity, orthopnea, PND, pedal edema, claudication. Remaining systems are negative.  Physical Exam: Well-developed well-nourished in no acute distress.  Skin is warm and dry.  HEENT is normal.  Neck is supple. No thyromegaly.  Chest is clear to auscultation with normal expansion.  Cardiovascular exam is regular rate and rhythm. 2/6 systolic murmur left sternal border. Abdominal exam nontender or distended. No masses palpated. Extremities show no edema. neuro grossly intact  ECG NSR with first degree AV block.

## 2010-11-14 NOTE — Assessment & Plan Note (Signed)
No change in recent renal Dopplers.

## 2010-11-14 NOTE — Assessment & Plan Note (Signed)
Well-controlled. Continue beta blocker.

## 2010-11-14 NOTE — Patient Instructions (Signed)
Your physician wants you to follow-up in: ONE YEAR You will receive a reminder letter in the mail two months in advance. If you don't receive a letter, please call our office to schedule the follow-up appointment. ONE YEAR

## 2010-11-14 NOTE — Assessment & Plan Note (Signed)
I will continue beta blocker.

## 2011-04-24 DIAGNOSIS — D485 Neoplasm of uncertain behavior of skin: Secondary | ICD-10-CM | POA: Diagnosis not present

## 2011-04-24 DIAGNOSIS — L719 Rosacea, unspecified: Secondary | ICD-10-CM | POA: Diagnosis not present

## 2011-04-24 DIAGNOSIS — D1801 Hemangioma of skin and subcutaneous tissue: Secondary | ICD-10-CM | POA: Diagnosis not present

## 2011-04-24 DIAGNOSIS — D2370 Other benign neoplasm of skin of unspecified lower limb, including hip: Secondary | ICD-10-CM | POA: Diagnosis not present

## 2011-04-24 DIAGNOSIS — L819 Disorder of pigmentation, unspecified: Secondary | ICD-10-CM | POA: Diagnosis not present

## 2011-04-24 DIAGNOSIS — L570 Actinic keratosis: Secondary | ICD-10-CM | POA: Diagnosis not present

## 2011-04-24 DIAGNOSIS — D239 Other benign neoplasm of skin, unspecified: Secondary | ICD-10-CM | POA: Diagnosis not present

## 2011-05-08 DIAGNOSIS — H4050X0 Glaucoma secondary to other eye disorders, unspecified eye, stage unspecified: Secondary | ICD-10-CM | POA: Diagnosis not present

## 2011-05-08 DIAGNOSIS — H2510 Age-related nuclear cataract, unspecified eye: Secondary | ICD-10-CM | POA: Diagnosis not present

## 2011-05-20 DIAGNOSIS — H0019 Chalazion unspecified eye, unspecified eyelid: Secondary | ICD-10-CM | POA: Diagnosis not present

## 2011-06-03 DIAGNOSIS — H04129 Dry eye syndrome of unspecified lacrimal gland: Secondary | ICD-10-CM | POA: Diagnosis not present

## 2011-06-27 DIAGNOSIS — H903 Sensorineural hearing loss, bilateral: Secondary | ICD-10-CM | POA: Diagnosis not present

## 2011-07-01 DIAGNOSIS — H01029 Squamous blepharitis unspecified eye, unspecified eyelid: Secondary | ICD-10-CM | POA: Diagnosis not present

## 2011-07-15 DIAGNOSIS — H01029 Squamous blepharitis unspecified eye, unspecified eyelid: Secondary | ICD-10-CM | POA: Diagnosis not present

## 2011-08-07 DIAGNOSIS — H01029 Squamous blepharitis unspecified eye, unspecified eyelid: Secondary | ICD-10-CM | POA: Diagnosis not present

## 2011-08-12 DIAGNOSIS — H40039 Anatomical narrow angle, unspecified eye: Secondary | ICD-10-CM | POA: Diagnosis not present

## 2011-08-15 DIAGNOSIS — J309 Allergic rhinitis, unspecified: Secondary | ICD-10-CM | POA: Diagnosis not present

## 2011-08-15 DIAGNOSIS — H1030 Unspecified acute conjunctivitis, unspecified eye: Secondary | ICD-10-CM | POA: Diagnosis not present

## 2011-09-20 DIAGNOSIS — H04209 Unspecified epiphora, unspecified lacrimal gland: Secondary | ICD-10-CM | POA: Diagnosis not present

## 2011-09-20 DIAGNOSIS — L719 Rosacea, unspecified: Secondary | ICD-10-CM | POA: Diagnosis not present

## 2011-09-20 DIAGNOSIS — L718 Other rosacea: Secondary | ICD-10-CM | POA: Insufficient documentation

## 2011-09-20 DIAGNOSIS — H01009 Unspecified blepharitis unspecified eye, unspecified eyelid: Secondary | ICD-10-CM | POA: Insufficient documentation

## 2011-09-20 DIAGNOSIS — H3530 Unspecified macular degeneration: Secondary | ICD-10-CM | POA: Insufficient documentation

## 2011-10-28 DIAGNOSIS — IMO0001 Reserved for inherently not codable concepts without codable children: Secondary | ICD-10-CM | POA: Diagnosis not present

## 2011-10-28 DIAGNOSIS — E559 Vitamin D deficiency, unspecified: Secondary | ICD-10-CM | POA: Diagnosis not present

## 2011-10-28 DIAGNOSIS — F411 Generalized anxiety disorder: Secondary | ICD-10-CM | POA: Diagnosis not present

## 2011-10-30 DIAGNOSIS — H01009 Unspecified blepharitis unspecified eye, unspecified eyelid: Secondary | ICD-10-CM | POA: Diagnosis not present

## 2011-10-30 DIAGNOSIS — H3530 Unspecified macular degeneration: Secondary | ICD-10-CM | POA: Diagnosis not present

## 2011-10-30 DIAGNOSIS — L719 Rosacea, unspecified: Secondary | ICD-10-CM | POA: Diagnosis not present

## 2011-10-30 DIAGNOSIS — H04209 Unspecified epiphora, unspecified lacrimal gland: Secondary | ICD-10-CM | POA: Diagnosis not present

## 2011-11-19 DIAGNOSIS — Z760 Encounter for issue of repeat prescription: Secondary | ICD-10-CM | POA: Diagnosis not present

## 2011-11-19 DIAGNOSIS — H04569 Stenosis of unspecified lacrimal punctum: Secondary | ICD-10-CM | POA: Diagnosis not present

## 2011-11-19 DIAGNOSIS — H04209 Unspecified epiphora, unspecified lacrimal gland: Secondary | ICD-10-CM | POA: Diagnosis not present

## 2011-11-19 DIAGNOSIS — H0289 Other specified disorders of eyelid: Secondary | ICD-10-CM | POA: Diagnosis not present

## 2011-11-19 DIAGNOSIS — L719 Rosacea, unspecified: Secondary | ICD-10-CM | POA: Diagnosis not present

## 2011-11-19 DIAGNOSIS — H01009 Unspecified blepharitis unspecified eye, unspecified eyelid: Secondary | ICD-10-CM | POA: Diagnosis not present

## 2011-12-18 DIAGNOSIS — H3530 Unspecified macular degeneration: Secondary | ICD-10-CM | POA: Diagnosis not present

## 2011-12-18 DIAGNOSIS — H01009 Unspecified blepharitis unspecified eye, unspecified eyelid: Secondary | ICD-10-CM | POA: Diagnosis not present

## 2011-12-18 DIAGNOSIS — L719 Rosacea, unspecified: Secondary | ICD-10-CM | POA: Diagnosis not present

## 2011-12-18 DIAGNOSIS — H04209 Unspecified epiphora, unspecified lacrimal gland: Secondary | ICD-10-CM | POA: Diagnosis not present

## 2012-01-13 DIAGNOSIS — H01009 Unspecified blepharitis unspecified eye, unspecified eyelid: Secondary | ICD-10-CM | POA: Diagnosis not present

## 2012-01-13 DIAGNOSIS — H269 Unspecified cataract: Secondary | ICD-10-CM | POA: Diagnosis not present

## 2012-01-17 DIAGNOSIS — L719 Rosacea, unspecified: Secondary | ICD-10-CM | POA: Diagnosis not present

## 2012-01-17 DIAGNOSIS — H01009 Unspecified blepharitis unspecified eye, unspecified eyelid: Secondary | ICD-10-CM | POA: Diagnosis not present

## 2012-01-17 DIAGNOSIS — H3530 Unspecified macular degeneration: Secondary | ICD-10-CM | POA: Diagnosis not present

## 2012-01-17 DIAGNOSIS — H04209 Unspecified epiphora, unspecified lacrimal gland: Secondary | ICD-10-CM | POA: Diagnosis not present

## 2012-02-12 DIAGNOSIS — H04209 Unspecified epiphora, unspecified lacrimal gland: Secondary | ICD-10-CM | POA: Diagnosis not present

## 2012-02-12 DIAGNOSIS — L719 Rosacea, unspecified: Secondary | ICD-10-CM | POA: Diagnosis not present

## 2012-02-12 DIAGNOSIS — H3530 Unspecified macular degeneration: Secondary | ICD-10-CM | POA: Diagnosis not present

## 2012-02-12 DIAGNOSIS — H01009 Unspecified blepharitis unspecified eye, unspecified eyelid: Secondary | ICD-10-CM | POA: Diagnosis not present

## 2012-02-19 DIAGNOSIS — D696 Thrombocytopenia, unspecified: Secondary | ICD-10-CM | POA: Diagnosis not present

## 2012-02-19 DIAGNOSIS — Z0000 Encounter for general adult medical examination without abnormal findings: Secondary | ICD-10-CM | POA: Diagnosis not present

## 2012-02-19 DIAGNOSIS — N281 Cyst of kidney, acquired: Secondary | ICD-10-CM | POA: Diagnosis not present

## 2012-02-19 DIAGNOSIS — I517 Cardiomegaly: Secondary | ICD-10-CM | POA: Diagnosis not present

## 2012-02-19 DIAGNOSIS — E559 Vitamin D deficiency, unspecified: Secondary | ICD-10-CM | POA: Diagnosis not present

## 2012-02-26 ENCOUNTER — Other Ambulatory Visit: Payer: Self-pay | Admitting: Internal Medicine

## 2012-02-26 DIAGNOSIS — Z1331 Encounter for screening for depression: Secondary | ICD-10-CM | POA: Diagnosis not present

## 2012-02-26 DIAGNOSIS — N281 Cyst of kidney, acquired: Secondary | ICD-10-CM | POA: Diagnosis not present

## 2012-02-26 DIAGNOSIS — I059 Rheumatic mitral valve disease, unspecified: Secondary | ICD-10-CM | POA: Diagnosis not present

## 2012-02-26 DIAGNOSIS — Z0000 Encounter for general adult medical examination without abnormal findings: Secondary | ICD-10-CM | POA: Diagnosis not present

## 2012-03-12 ENCOUNTER — Ambulatory Visit
Admission: RE | Admit: 2012-03-12 | Discharge: 2012-03-12 | Disposition: A | Discharge status: Home / Self Care | Payer: Medicare Other | Source: Ambulatory Visit | Attending: Internal Medicine | Admitting: Internal Medicine

## 2012-03-12 DIAGNOSIS — N281 Cyst of kidney, acquired: Secondary | ICD-10-CM | POA: Diagnosis not present

## 2012-03-23 DIAGNOSIS — H04209 Unspecified epiphora, unspecified lacrimal gland: Secondary | ICD-10-CM | POA: Diagnosis not present

## 2012-03-23 DIAGNOSIS — H0289 Other specified disorders of eyelid: Secondary | ICD-10-CM | POA: Diagnosis not present

## 2012-03-23 DIAGNOSIS — H04569 Stenosis of unspecified lacrimal punctum: Secondary | ICD-10-CM | POA: Diagnosis not present

## 2012-03-23 DIAGNOSIS — L719 Rosacea, unspecified: Secondary | ICD-10-CM | POA: Diagnosis not present

## 2012-03-23 DIAGNOSIS — H01009 Unspecified blepharitis unspecified eye, unspecified eyelid: Secondary | ICD-10-CM | POA: Diagnosis not present

## 2012-04-06 DIAGNOSIS — H3530 Unspecified macular degeneration: Secondary | ICD-10-CM | POA: Diagnosis not present

## 2012-04-06 DIAGNOSIS — L719 Rosacea, unspecified: Secondary | ICD-10-CM | POA: Diagnosis not present

## 2012-04-06 DIAGNOSIS — H04209 Unspecified epiphora, unspecified lacrimal gland: Secondary | ICD-10-CM | POA: Diagnosis not present

## 2012-04-06 DIAGNOSIS — H04569 Stenosis of unspecified lacrimal punctum: Secondary | ICD-10-CM | POA: Diagnosis not present

## 2012-04-06 DIAGNOSIS — H01009 Unspecified blepharitis unspecified eye, unspecified eyelid: Secondary | ICD-10-CM | POA: Diagnosis not present

## 2012-04-07 ENCOUNTER — Ambulatory Visit: Payer: Medicare Other | Admitting: Cardiology

## 2012-04-28 ENCOUNTER — Ambulatory Visit: Payer: Medicare Other | Admitting: Cardiology

## 2012-05-20 DIAGNOSIS — H3530 Unspecified macular degeneration: Secondary | ICD-10-CM | POA: Diagnosis not present

## 2012-05-20 DIAGNOSIS — L719 Rosacea, unspecified: Secondary | ICD-10-CM | POA: Diagnosis not present

## 2012-05-20 DIAGNOSIS — H04209 Unspecified epiphora, unspecified lacrimal gland: Secondary | ICD-10-CM | POA: Diagnosis not present

## 2012-05-20 DIAGNOSIS — H01009 Unspecified blepharitis unspecified eye, unspecified eyelid: Secondary | ICD-10-CM | POA: Diagnosis not present

## 2012-06-09 ENCOUNTER — Encounter: Payer: Self-pay | Admitting: Cardiology

## 2012-06-09 ENCOUNTER — Ambulatory Visit (INDEPENDENT_AMBULATORY_CARE_PROVIDER_SITE_OTHER): Payer: Medicare Other | Admitting: Cardiology

## 2012-06-09 VITALS — BP 120/80 | HR 60 | Wt 111.0 lb

## 2012-06-09 DIAGNOSIS — I701 Atherosclerosis of renal artery: Secondary | ICD-10-CM

## 2012-06-09 MED ORDER — ATENOLOL 25 MG PO TABS: ORAL_TABLET | ORAL | Status: AC

## 2012-06-09 NOTE — Assessment & Plan Note (Signed)
Plan repeat echocardiogram when she returns in one year. 

## 2012-06-09 NOTE — Patient Instructions (Addendum)
Your physician wants you to follow-up in: ONE YEAR WITH DR CRENSHAW You will receive a reminder letter in the mail two months in advance. If you don't receive a letter, please call our office to schedule the follow-up appointment.   Your physician has requested that you have a renal artery duplex. During this test, an ultrasound is used to evaluate blood flow to the kidneys. Allow one hour for this exam. Do not eat after midnight the day before and avoid carbonated beverages. Take your medications as you usually do.   

## 2012-06-09 NOTE — Progress Notes (Signed)
   HPI: Ashley Washington is a pleasant female, who has a history of palpitations, fibromuscular dysplasia of her right renal artery, and chest pain. Most recent Myoview in October of 2010 showed normal LV function and normal perfusion. Her last renal Dopplers in August of 2012 showed 1-59% on the right and normal left. Large cyst in left kidney. Last echocardiogram in August of 2012 showed normal LV function, mild to moderate TR and a trivial pericardial effusion. I last saw her in Sept 2012. Since then there is no dyspnea on exertion, orthopnea, PND or pedal edema. She continues to feel an occasional skip no sustained palpitations. No exertional chest pain.  Current Outpatient Prescriptions  Medication Sig Dispense Refill  . atenolol (TENORMIN) 25 MG tablet 1/2 tab po qd      . B Complex Vitamins (VITAMIN B COMPLEX PO) 1 TAB MON , WED,FRIDAY      . Bioflavonoid Products (ESTER-C) 500-200-60 MG TABS Take 1 tablet by mouth daily.      . Coenzyme Q10 (CO Q 10 PO) Take 1 tablet by mouth daily.      Marland Kitchen LORazepam (ATIVAN) 0.5 MG tablet Take 0.5 mg by mouth every 8 (eight) hours.      . Omega-3 Fatty Acids (OMEGA 3 PO) Take 1 tablet by mouth daily.      . vitamin C (ASCORBIC ACID) 500 MG tablet Take 500 mg by mouth daily.        . vitamin E 200 UNIT capsule Take 200 Units by mouth daily.       No current facility-administered medications for this visit.     Past Medical History  Diagnosis Date  . Mitral valve prolapse   . Fibromuscular dysplasia of renal artery     History reviewed. No pertinent past surgical history.  History   Social History  . Marital Status: Married    Spouse Name: N/A    Number of Children: N/A  . Years of Education: N/A   Occupational History  . Not on file.   Social History Main Topics  . Smoking status: Never Smoker   . Smokeless tobacco: Not on file  . Alcohol Use: Not on file  . Drug Use: Not on file  . Sexually Active: Not on file   Other Topics Concern  .  Not on file   Social History Narrative  . No narrative on file    ROS: no fevers or chills, productive cough, hemoptysis, dysphasia, odynophagia, melena, hematochezia, dysuria, hematuria, rash, seizure activity, orthopnea, PND, pedal edema, claudication. Remaining systems are negative.  Physical Exam: Well-developed well-nourished in no acute distress.  Skin is warm and dry.  HEENT is normal.  Neck is supple.  Chest is clear to auscultation with normal expansion.  Cardiovascular exam is regular rate and rhythm. 2/6 systolic murmur left sternal border. Abdominal exam nontender or distended. No masses palpated. Extremities show no edema. neuro grossly intact  ECG sinus rhythm at a rate of 60. No ST changes. First degree AV block

## 2012-06-09 NOTE — Assessment & Plan Note (Signed)
Continue beta blocker. 

## 2012-06-09 NOTE — Assessment & Plan Note (Signed)
Repeat renal Dopplers. 

## 2012-06-12 ENCOUNTER — Telehealth: Payer: Self-pay | Admitting: Cardiology

## 2012-06-12 MED ORDER — LORAZEPAM 0.5 MG PO TABS: ORAL_TABLET | ORAL | Status: DC

## 2012-06-12 NOTE — Telephone Encounter (Signed)
New problem    Pt wants to discuss information that was on her checkout sheet

## 2012-06-12 NOTE — Telephone Encounter (Signed)
Spoke with pt, she wants the directions for the ativan changed to as needed. She only takes it maybe once every two weeks. Changes made.

## 2012-07-09 DIAGNOSIS — H5200 Hypermetropia, unspecified eye: Secondary | ICD-10-CM | POA: Diagnosis not present

## 2012-07-09 DIAGNOSIS — H25019 Cortical age-related cataract, unspecified eye: Secondary | ICD-10-CM | POA: Diagnosis not present

## 2012-07-09 DIAGNOSIS — H01009 Unspecified blepharitis unspecified eye, unspecified eyelid: Secondary | ICD-10-CM | POA: Diagnosis not present

## 2012-07-28 DIAGNOSIS — H2510 Age-related nuclear cataract, unspecified eye: Secondary | ICD-10-CM | POA: Diagnosis not present

## 2012-07-28 DIAGNOSIS — H0019 Chalazion unspecified eye, unspecified eyelid: Secondary | ICD-10-CM | POA: Diagnosis not present

## 2012-08-06 DIAGNOSIS — L089 Local infection of the skin and subcutaneous tissue, unspecified: Secondary | ICD-10-CM | POA: Diagnosis not present

## 2012-08-06 DIAGNOSIS — D0439 Carcinoma in situ of skin of other parts of face: Secondary | ICD-10-CM | POA: Diagnosis not present

## 2012-08-06 DIAGNOSIS — D0430 Carcinoma in situ of skin of unspecified part of face: Secondary | ICD-10-CM | POA: Diagnosis not present

## 2012-08-06 DIAGNOSIS — L719 Rosacea, unspecified: Secondary | ICD-10-CM | POA: Diagnosis not present

## 2012-08-06 DIAGNOSIS — D485 Neoplasm of uncertain behavior of skin: Secondary | ICD-10-CM | POA: Diagnosis not present

## 2012-09-15 DIAGNOSIS — L821 Other seborrheic keratosis: Secondary | ICD-10-CM | POA: Diagnosis not present

## 2012-09-15 DIAGNOSIS — L570 Actinic keratosis: Secondary | ICD-10-CM | POA: Diagnosis not present

## 2012-09-15 DIAGNOSIS — D1801 Hemangioma of skin and subcutaneous tissue: Secondary | ICD-10-CM | POA: Diagnosis not present

## 2012-09-15 DIAGNOSIS — L719 Rosacea, unspecified: Secondary | ICD-10-CM | POA: Diagnosis not present

## 2012-09-15 DIAGNOSIS — Z85828 Personal history of other malignant neoplasm of skin: Secondary | ICD-10-CM | POA: Diagnosis not present

## 2012-09-21 DIAGNOSIS — C4330 Malignant melanoma of unspecified part of face: Secondary | ICD-10-CM | POA: Diagnosis not present

## 2012-10-12 DIAGNOSIS — D0430 Carcinoma in situ of skin of unspecified part of face: Secondary | ICD-10-CM | POA: Diagnosis not present

## 2012-10-12 DIAGNOSIS — D0439 Carcinoma in situ of skin of other parts of face: Secondary | ICD-10-CM | POA: Diagnosis not present

## 2012-10-12 DIAGNOSIS — C4330 Malignant melanoma of unspecified part of face: Secondary | ICD-10-CM | POA: Diagnosis not present

## 2012-10-14 DIAGNOSIS — C4330 Malignant melanoma of unspecified part of face: Secondary | ICD-10-CM | POA: Diagnosis not present

## 2012-10-21 DIAGNOSIS — C44320 Squamous cell carcinoma of skin of unspecified parts of face: Secondary | ICD-10-CM | POA: Diagnosis not present

## 2012-11-05 DIAGNOSIS — L570 Actinic keratosis: Secondary | ICD-10-CM | POA: Diagnosis not present

## 2012-11-05 DIAGNOSIS — Z85828 Personal history of other malignant neoplasm of skin: Secondary | ICD-10-CM | POA: Diagnosis not present

## 2012-11-05 DIAGNOSIS — D2370 Other benign neoplasm of skin of unspecified lower limb, including hip: Secondary | ICD-10-CM | POA: Diagnosis not present

## 2012-11-05 DIAGNOSIS — Z85820 Personal history of malignant melanoma of skin: Secondary | ICD-10-CM | POA: Diagnosis not present

## 2012-11-05 DIAGNOSIS — L821 Other seborrheic keratosis: Secondary | ICD-10-CM | POA: Diagnosis not present

## 2012-11-05 DIAGNOSIS — D239 Other benign neoplasm of skin, unspecified: Secondary | ICD-10-CM | POA: Diagnosis not present

## 2012-11-05 DIAGNOSIS — D1801 Hemangioma of skin and subcutaneous tissue: Secondary | ICD-10-CM | POA: Diagnosis not present

## 2012-11-05 DIAGNOSIS — L819 Disorder of pigmentation, unspecified: Secondary | ICD-10-CM | POA: Diagnosis not present

## 2012-12-30 DIAGNOSIS — IMO0002 Reserved for concepts with insufficient information to code with codable children: Secondary | ICD-10-CM | POA: Diagnosis not present

## 2012-12-30 DIAGNOSIS — R0982 Postnasal drip: Secondary | ICD-10-CM | POA: Diagnosis not present

## 2012-12-30 DIAGNOSIS — R05 Cough: Secondary | ICD-10-CM | POA: Diagnosis not present

## 2012-12-30 DIAGNOSIS — R059 Cough, unspecified: Secondary | ICD-10-CM | POA: Diagnosis not present

## 2013-01-11 DIAGNOSIS — R5381 Other malaise: Secondary | ICD-10-CM | POA: Diagnosis not present

## 2013-01-11 DIAGNOSIS — N951 Menopausal and female climacteric states: Secondary | ICD-10-CM | POA: Diagnosis not present

## 2013-01-11 DIAGNOSIS — N83209 Unspecified ovarian cyst, unspecified side: Secondary | ICD-10-CM | POA: Diagnosis not present

## 2013-01-11 DIAGNOSIS — Z1231 Encounter for screening mammogram for malignant neoplasm of breast: Secondary | ICD-10-CM | POA: Diagnosis not present

## 2013-01-11 DIAGNOSIS — R141 Gas pain: Secondary | ICD-10-CM | POA: Diagnosis not present

## 2013-01-13 ENCOUNTER — Other Ambulatory Visit: Payer: Self-pay | Admitting: Obstetrics & Gynecology

## 2013-01-13 DIAGNOSIS — R928 Other abnormal and inconclusive findings on diagnostic imaging of breast: Secondary | ICD-10-CM

## 2013-02-11 ENCOUNTER — Ambulatory Visit
Admission: RE | Admit: 2013-02-11 | Discharge: 2013-02-11 | Disposition: A | Discharge status: Home / Self Care | Payer: Medicare Other | Source: Ambulatory Visit | Attending: Obstetrics & Gynecology | Admitting: Obstetrics & Gynecology

## 2013-02-11 DIAGNOSIS — R928 Other abnormal and inconclusive findings on diagnostic imaging of breast: Secondary | ICD-10-CM

## 2013-02-25 DIAGNOSIS — M545 Low back pain, unspecified: Secondary | ICD-10-CM | POA: Diagnosis not present

## 2013-03-01 DIAGNOSIS — H04209 Unspecified epiphora, unspecified lacrimal gland: Secondary | ICD-10-CM | POA: Diagnosis not present

## 2013-03-01 DIAGNOSIS — H2513 Age-related nuclear cataract, bilateral: Secondary | ICD-10-CM | POA: Insufficient documentation

## 2013-03-01 DIAGNOSIS — L719 Rosacea, unspecified: Secondary | ICD-10-CM | POA: Diagnosis not present

## 2013-03-01 DIAGNOSIS — H01009 Unspecified blepharitis unspecified eye, unspecified eyelid: Secondary | ICD-10-CM | POA: Diagnosis not present

## 2013-03-01 DIAGNOSIS — H2510 Age-related nuclear cataract, unspecified eye: Secondary | ICD-10-CM | POA: Diagnosis not present

## 2013-04-09 ENCOUNTER — Other Ambulatory Visit: Payer: Self-pay | Admitting: Internal Medicine

## 2013-04-09 ENCOUNTER — Ambulatory Visit
Admission: RE | Admit: 2013-04-09 | Discharge: 2013-04-09 | Disposition: A | Discharge status: Home / Self Care | Payer: Medicare Other | Source: Ambulatory Visit | Attending: Internal Medicine | Admitting: Internal Medicine

## 2013-04-09 DIAGNOSIS — M545 Low back pain, unspecified: Secondary | ICD-10-CM | POA: Diagnosis not present

## 2013-04-09 DIAGNOSIS — M4310 Spondylolisthesis, site unspecified: Secondary | ICD-10-CM | POA: Diagnosis not present

## 2013-04-09 DIAGNOSIS — M47817 Spondylosis without myelopathy or radiculopathy, lumbosacral region: Secondary | ICD-10-CM | POA: Diagnosis not present

## 2013-04-09 DIAGNOSIS — IMO0002 Reserved for concepts with insufficient information to code with codable children: Secondary | ICD-10-CM

## 2013-04-15 DIAGNOSIS — M4802 Spinal stenosis, cervical region: Secondary | ICD-10-CM | POA: Diagnosis not present

## 2013-04-15 DIAGNOSIS — M5030 Other cervical disc degeneration, unspecified cervical region: Secondary | ICD-10-CM | POA: Diagnosis not present

## 2013-04-15 DIAGNOSIS — IMO0002 Reserved for concepts with insufficient information to code with codable children: Secondary | ICD-10-CM | POA: Diagnosis not present

## 2013-04-15 DIAGNOSIS — M999 Biomechanical lesion, unspecified: Secondary | ICD-10-CM | POA: Diagnosis not present

## 2013-04-15 DIAGNOSIS — M9981 Other biomechanical lesions of cervical region: Secondary | ICD-10-CM | POA: Diagnosis not present

## 2013-04-28 DIAGNOSIS — M999 Biomechanical lesion, unspecified: Secondary | ICD-10-CM | POA: Diagnosis not present

## 2013-04-28 DIAGNOSIS — M5030 Other cervical disc degeneration, unspecified cervical region: Secondary | ICD-10-CM | POA: Diagnosis not present

## 2013-04-28 DIAGNOSIS — M4802 Spinal stenosis, cervical region: Secondary | ICD-10-CM | POA: Diagnosis not present

## 2013-04-28 DIAGNOSIS — IMO0002 Reserved for concepts with insufficient information to code with codable children: Secondary | ICD-10-CM | POA: Diagnosis not present

## 2013-04-28 DIAGNOSIS — M9981 Other biomechanical lesions of cervical region: Secondary | ICD-10-CM | POA: Diagnosis not present

## 2013-04-29 DIAGNOSIS — IMO0002 Reserved for concepts with insufficient information to code with codable children: Secondary | ICD-10-CM | POA: Diagnosis not present

## 2013-04-29 DIAGNOSIS — M999 Biomechanical lesion, unspecified: Secondary | ICD-10-CM | POA: Diagnosis not present

## 2013-04-29 DIAGNOSIS — M4802 Spinal stenosis, cervical region: Secondary | ICD-10-CM | POA: Diagnosis not present

## 2013-04-29 DIAGNOSIS — M5137 Other intervertebral disc degeneration, lumbosacral region: Secondary | ICD-10-CM | POA: Diagnosis not present

## 2013-04-29 DIAGNOSIS — M5030 Other cervical disc degeneration, unspecified cervical region: Secondary | ICD-10-CM | POA: Diagnosis not present

## 2013-04-29 DIAGNOSIS — M9981 Other biomechanical lesions of cervical region: Secondary | ICD-10-CM | POA: Diagnosis not present

## 2013-05-03 DIAGNOSIS — M4802 Spinal stenosis, cervical region: Secondary | ICD-10-CM | POA: Diagnosis not present

## 2013-05-03 DIAGNOSIS — M9981 Other biomechanical lesions of cervical region: Secondary | ICD-10-CM | POA: Diagnosis not present

## 2013-05-03 DIAGNOSIS — M999 Biomechanical lesion, unspecified: Secondary | ICD-10-CM | POA: Diagnosis not present

## 2013-05-03 DIAGNOSIS — IMO0002 Reserved for concepts with insufficient information to code with codable children: Secondary | ICD-10-CM | POA: Diagnosis not present

## 2013-05-03 DIAGNOSIS — M5137 Other intervertebral disc degeneration, lumbosacral region: Secondary | ICD-10-CM | POA: Diagnosis not present

## 2013-05-03 DIAGNOSIS — M5030 Other cervical disc degeneration, unspecified cervical region: Secondary | ICD-10-CM | POA: Diagnosis not present

## 2013-05-05 DIAGNOSIS — M5137 Other intervertebral disc degeneration, lumbosacral region: Secondary | ICD-10-CM | POA: Diagnosis not present

## 2013-05-05 DIAGNOSIS — M9981 Other biomechanical lesions of cervical region: Secondary | ICD-10-CM | POA: Diagnosis not present

## 2013-05-05 DIAGNOSIS — IMO0002 Reserved for concepts with insufficient information to code with codable children: Secondary | ICD-10-CM | POA: Diagnosis not present

## 2013-05-05 DIAGNOSIS — M999 Biomechanical lesion, unspecified: Secondary | ICD-10-CM | POA: Diagnosis not present

## 2013-05-05 DIAGNOSIS — M5030 Other cervical disc degeneration, unspecified cervical region: Secondary | ICD-10-CM | POA: Diagnosis not present

## 2013-05-05 DIAGNOSIS — M4802 Spinal stenosis, cervical region: Secondary | ICD-10-CM | POA: Diagnosis not present

## 2013-05-10 DIAGNOSIS — M5137 Other intervertebral disc degeneration, lumbosacral region: Secondary | ICD-10-CM | POA: Diagnosis not present

## 2013-05-10 DIAGNOSIS — M5030 Other cervical disc degeneration, unspecified cervical region: Secondary | ICD-10-CM | POA: Diagnosis not present

## 2013-05-10 DIAGNOSIS — M4802 Spinal stenosis, cervical region: Secondary | ICD-10-CM | POA: Diagnosis not present

## 2013-05-10 DIAGNOSIS — M999 Biomechanical lesion, unspecified: Secondary | ICD-10-CM | POA: Diagnosis not present

## 2013-05-10 DIAGNOSIS — IMO0002 Reserved for concepts with insufficient information to code with codable children: Secondary | ICD-10-CM | POA: Diagnosis not present

## 2013-05-10 DIAGNOSIS — M9981 Other biomechanical lesions of cervical region: Secondary | ICD-10-CM | POA: Diagnosis not present

## 2013-05-11 DIAGNOSIS — M9981 Other biomechanical lesions of cervical region: Secondary | ICD-10-CM | POA: Diagnosis not present

## 2013-05-11 DIAGNOSIS — M4802 Spinal stenosis, cervical region: Secondary | ICD-10-CM | POA: Diagnosis not present

## 2013-05-11 DIAGNOSIS — M999 Biomechanical lesion, unspecified: Secondary | ICD-10-CM | POA: Diagnosis not present

## 2013-05-11 DIAGNOSIS — M5030 Other cervical disc degeneration, unspecified cervical region: Secondary | ICD-10-CM | POA: Diagnosis not present

## 2013-05-11 DIAGNOSIS — M5137 Other intervertebral disc degeneration, lumbosacral region: Secondary | ICD-10-CM | POA: Diagnosis not present

## 2013-05-11 DIAGNOSIS — IMO0002 Reserved for concepts with insufficient information to code with codable children: Secondary | ICD-10-CM | POA: Diagnosis not present

## 2013-05-13 DIAGNOSIS — IMO0002 Reserved for concepts with insufficient information to code with codable children: Secondary | ICD-10-CM | POA: Diagnosis not present

## 2013-05-13 DIAGNOSIS — M4802 Spinal stenosis, cervical region: Secondary | ICD-10-CM | POA: Diagnosis not present

## 2013-05-13 DIAGNOSIS — M5030 Other cervical disc degeneration, unspecified cervical region: Secondary | ICD-10-CM | POA: Diagnosis not present

## 2013-05-13 DIAGNOSIS — M999 Biomechanical lesion, unspecified: Secondary | ICD-10-CM | POA: Diagnosis not present

## 2013-05-13 DIAGNOSIS — M5137 Other intervertebral disc degeneration, lumbosacral region: Secondary | ICD-10-CM | POA: Diagnosis not present

## 2013-05-13 DIAGNOSIS — M9981 Other biomechanical lesions of cervical region: Secondary | ICD-10-CM | POA: Diagnosis not present

## 2013-05-14 DIAGNOSIS — L259 Unspecified contact dermatitis, unspecified cause: Secondary | ICD-10-CM | POA: Diagnosis not present

## 2013-05-14 DIAGNOSIS — Z85820 Personal history of malignant melanoma of skin: Secondary | ICD-10-CM | POA: Diagnosis not present

## 2013-05-14 DIAGNOSIS — Z85828 Personal history of other malignant neoplasm of skin: Secondary | ICD-10-CM | POA: Diagnosis not present

## 2013-05-14 DIAGNOSIS — D1801 Hemangioma of skin and subcutaneous tissue: Secondary | ICD-10-CM | POA: Diagnosis not present

## 2013-05-14 DIAGNOSIS — B351 Tinea unguium: Secondary | ICD-10-CM | POA: Diagnosis not present

## 2013-05-14 DIAGNOSIS — L820 Inflamed seborrheic keratosis: Secondary | ICD-10-CM | POA: Diagnosis not present

## 2013-05-14 DIAGNOSIS — L821 Other seborrheic keratosis: Secondary | ICD-10-CM | POA: Diagnosis not present

## 2013-05-17 DIAGNOSIS — M5137 Other intervertebral disc degeneration, lumbosacral region: Secondary | ICD-10-CM | POA: Diagnosis not present

## 2013-05-17 DIAGNOSIS — M999 Biomechanical lesion, unspecified: Secondary | ICD-10-CM | POA: Diagnosis not present

## 2013-05-17 DIAGNOSIS — IMO0002 Reserved for concepts with insufficient information to code with codable children: Secondary | ICD-10-CM | POA: Diagnosis not present

## 2013-05-17 DIAGNOSIS — M9981 Other biomechanical lesions of cervical region: Secondary | ICD-10-CM | POA: Diagnosis not present

## 2013-05-17 DIAGNOSIS — M4802 Spinal stenosis, cervical region: Secondary | ICD-10-CM | POA: Diagnosis not present

## 2013-05-17 DIAGNOSIS — M5030 Other cervical disc degeneration, unspecified cervical region: Secondary | ICD-10-CM | POA: Diagnosis not present

## 2013-05-20 DIAGNOSIS — M4802 Spinal stenosis, cervical region: Secondary | ICD-10-CM | POA: Diagnosis not present

## 2013-05-20 DIAGNOSIS — M5030 Other cervical disc degeneration, unspecified cervical region: Secondary | ICD-10-CM | POA: Diagnosis not present

## 2013-05-20 DIAGNOSIS — M9981 Other biomechanical lesions of cervical region: Secondary | ICD-10-CM | POA: Diagnosis not present

## 2013-05-20 DIAGNOSIS — IMO0002 Reserved for concepts with insufficient information to code with codable children: Secondary | ICD-10-CM | POA: Diagnosis not present

## 2013-05-20 DIAGNOSIS — M5137 Other intervertebral disc degeneration, lumbosacral region: Secondary | ICD-10-CM | POA: Diagnosis not present

## 2013-05-20 DIAGNOSIS — M999 Biomechanical lesion, unspecified: Secondary | ICD-10-CM | POA: Diagnosis not present

## 2013-05-24 DIAGNOSIS — IMO0002 Reserved for concepts with insufficient information to code with codable children: Secondary | ICD-10-CM | POA: Diagnosis not present

## 2013-05-24 DIAGNOSIS — M4802 Spinal stenosis, cervical region: Secondary | ICD-10-CM | POA: Diagnosis not present

## 2013-05-24 DIAGNOSIS — M5030 Other cervical disc degeneration, unspecified cervical region: Secondary | ICD-10-CM | POA: Diagnosis not present

## 2013-05-24 DIAGNOSIS — M999 Biomechanical lesion, unspecified: Secondary | ICD-10-CM | POA: Diagnosis not present

## 2013-05-24 DIAGNOSIS — M9981 Other biomechanical lesions of cervical region: Secondary | ICD-10-CM | POA: Diagnosis not present

## 2013-05-24 DIAGNOSIS — M5137 Other intervertebral disc degeneration, lumbosacral region: Secondary | ICD-10-CM | POA: Diagnosis not present

## 2013-06-04 DIAGNOSIS — H04209 Unspecified epiphora, unspecified lacrimal gland: Secondary | ICD-10-CM | POA: Diagnosis not present

## 2013-06-04 DIAGNOSIS — L719 Rosacea, unspecified: Secondary | ICD-10-CM | POA: Diagnosis not present

## 2013-06-04 DIAGNOSIS — H2510 Age-related nuclear cataract, unspecified eye: Secondary | ICD-10-CM | POA: Diagnosis not present

## 2013-06-04 DIAGNOSIS — H01009 Unspecified blepharitis unspecified eye, unspecified eyelid: Secondary | ICD-10-CM | POA: Diagnosis not present

## 2013-07-07 DIAGNOSIS — E559 Vitamin D deficiency, unspecified: Secondary | ICD-10-CM | POA: Diagnosis not present

## 2013-07-07 DIAGNOSIS — M545 Low back pain, unspecified: Secondary | ICD-10-CM | POA: Diagnosis not present

## 2013-07-07 DIAGNOSIS — H579 Unspecified disorder of eye and adnexa: Secondary | ICD-10-CM | POA: Diagnosis not present

## 2013-07-07 DIAGNOSIS — Z79899 Other long term (current) drug therapy: Secondary | ICD-10-CM | POA: Diagnosis not present

## 2013-07-07 DIAGNOSIS — H04209 Unspecified epiphora, unspecified lacrimal gland: Secondary | ICD-10-CM | POA: Diagnosis not present

## 2013-07-07 DIAGNOSIS — L719 Rosacea, unspecified: Secondary | ICD-10-CM | POA: Diagnosis not present

## 2013-07-07 DIAGNOSIS — G479 Sleep disorder, unspecified: Secondary | ICD-10-CM | POA: Diagnosis not present

## 2013-07-07 DIAGNOSIS — H01009 Unspecified blepharitis unspecified eye, unspecified eyelid: Secondary | ICD-10-CM | POA: Diagnosis not present

## 2013-07-07 DIAGNOSIS — Z0000 Encounter for general adult medical examination without abnormal findings: Secondary | ICD-10-CM | POA: Diagnosis not present

## 2013-07-13 DIAGNOSIS — M545 Low back pain, unspecified: Secondary | ICD-10-CM | POA: Diagnosis not present

## 2013-07-13 DIAGNOSIS — M1990 Unspecified osteoarthritis, unspecified site: Secondary | ICD-10-CM | POA: Diagnosis not present

## 2013-07-13 DIAGNOSIS — I517 Cardiomegaly: Secondary | ICD-10-CM | POA: Diagnosis not present

## 2013-07-13 DIAGNOSIS — I059 Rheumatic mitral valve disease, unspecified: Secondary | ICD-10-CM | POA: Diagnosis not present

## 2013-07-13 DIAGNOSIS — Z1212 Encounter for screening for malignant neoplasm of rectum: Secondary | ICD-10-CM | POA: Diagnosis not present

## 2013-07-13 DIAGNOSIS — H40009 Preglaucoma, unspecified, unspecified eye: Secondary | ICD-10-CM | POA: Diagnosis not present

## 2013-07-13 DIAGNOSIS — IMO0001 Reserved for inherently not codable concepts without codable children: Secondary | ICD-10-CM | POA: Diagnosis not present

## 2013-07-13 DIAGNOSIS — Z0000 Encounter for general adult medical examination without abnormal findings: Secondary | ICD-10-CM | POA: Diagnosis not present

## 2013-07-13 DIAGNOSIS — Z1331 Encounter for screening for depression: Secondary | ICD-10-CM | POA: Diagnosis not present

## 2013-07-13 DIAGNOSIS — N281 Cyst of kidney, acquired: Secondary | ICD-10-CM | POA: Diagnosis not present

## 2013-10-04 ENCOUNTER — Encounter: Payer: Self-pay | Admitting: Podiatry

## 2013-10-04 ENCOUNTER — Ambulatory Visit (INDEPENDENT_AMBULATORY_CARE_PROVIDER_SITE_OTHER): Payer: Medicare Other | Admitting: Podiatry

## 2013-10-04 VITALS — BP 117/64 | HR 59 | Resp 14 | Ht 65.0 in | Wt 113.0 lb

## 2013-10-04 DIAGNOSIS — M2040 Other hammer toe(s) (acquired), unspecified foot: Secondary | ICD-10-CM | POA: Diagnosis not present

## 2013-10-04 NOTE — Progress Notes (Signed)
   Subjective:    Patient ID: Ashley Washington, female    DOB: [birth year], 80 y.o.   MRN: [id number]  HPI Comments: N hammer toe L right 4, 5th toes D right 4th - 6 months, 5th - about 9 months O C right 4th toe contracts with hard skin and darkened spot     5th has a red, hardened skin A painful in shoes, and history of melanoma T ight 4th toe pt shaves down and 5th toe pt states using medicated corn pads.     Review of Systems  Musculoskeletal: Positive for back pain and gait problem.  Skin:       Occular roseacia  All other systems reviewed and are negative.      Objective:   Physical Exam  Orientated x3 white female  Vascular: DP and PT pulses 2/4 bilaterally  Neurological: Sensation to 10 g monofilament wire intact 5/5 bilaterally Ankle reflex is equal and reactive bilaterally Vibratory sensation intact bilaterally  Dermatological: Keratoses distal fourth right toe with 1 mm area of dried blood noted Keratoses lateral fifth right toe  Musculoskeletal: Hammertoe deformities 2-4 bilaterally      Assessment & Plan:   Assessment: Hammertoe deformities 2-4 bilaterally Fourth right toes the most symptomatic area  Plan: Patient was advised of treatment options including conservative care including a toe prop versus surgical treatment Patient would like to try conservative care  A silicone toe prop was dispensed to elevate toes 2-4 on the right foot  Patient advised to wear this on an ongoing continuous basis and reappoint at her request

## 2013-10-04 NOTE — Patient Instructions (Signed)
Where the Toeprop on the right foot on a daily basis Hammer Toes Hammer toes is a condition in which one or more of your toes is permanently flexed. CAUSES  This happens when a muscle imbalance or abnormal bone length makes your small toes buckle. This causes the toe joint to contract and the strong cord-like bands that attach muscles to the bones (tendons) in your toes to shorten.  SIGNS AND SYMPTOMS  Common symptoms of flexible hammer toes include:   A buildup of skin cells (corns). Corns occur where boney bumps come in frequent contact with hard surfaces. For example, where your shoes press and rub.  Irritation.  Inflammation.  Pain.  Limited motion in your toes. DIAGNOSIS  Hammer toes are diagnosed through a physical exam of your toes. During the exam, your health care provider may try to reproduce your symptoms by manipulating your foot. Often, X-ray exams are done to determine the degree of deformity and to make sure that the cause is not a fracture.  TREATMENT  Hammer toes can be treated with corrective surgery. There are several types of surgical procedures that can treat hammer toes. The most common procedures include:  Arthroplasty--A portion of the joint is surgically removed and your toe is straightened. The gap fills in with fibrous tissue. This procedure helps treat pain and deformity and helps restore function.  Fusion--Cartilage between the two bones of the affected joint is taken out and the bones fuse together into one longer bone. This helps keep your toe stable and reduces pain but leaves your toe stiff, yet straight.  Implantation--A portion of your bone is removed and replaced with an implant to restore motion.  Flexor tendon transfers--This procedure repositions the tendons that curl the toes down (flexor tendons). This may be done to release the deforming force that causes your toe to buckle. Several of these procedures require fixing your toe with a pin that is  visible at the tip of your toe. The pin keeps the toe straight during healing. Your health care provider will remove the pin usually within 4-8 weeks after the procedure.  Document Released: 02/23/2000 Document Revised: 03/02/2013 Document Reviewed: 11/02/2012 [facility] Patient Information 2015 [place], [place]. This information is not intended to replace advice given to you by your health care provider. Make sure you discuss any questions you have with your health care provider.

## 2013-10-05 ENCOUNTER — Encounter: Payer: Self-pay | Admitting: Podiatry

## 2013-10-06 DIAGNOSIS — H2510 Age-related nuclear cataract, unspecified eye: Secondary | ICD-10-CM | POA: Diagnosis not present

## 2013-10-06 DIAGNOSIS — H579 Unspecified disorder of eye and adnexa: Secondary | ICD-10-CM | POA: Diagnosis not present

## 2013-10-06 DIAGNOSIS — L719 Rosacea, unspecified: Secondary | ICD-10-CM | POA: Diagnosis not present

## 2013-10-06 DIAGNOSIS — H01009 Unspecified blepharitis unspecified eye, unspecified eyelid: Secondary | ICD-10-CM | POA: Diagnosis not present

## 2013-10-06 DIAGNOSIS — H04209 Unspecified epiphora, unspecified lacrimal gland: Secondary | ICD-10-CM | POA: Diagnosis not present

## 2013-10-13 DIAGNOSIS — D72818 Other decreased white blood cell count: Secondary | ICD-10-CM | POA: Diagnosis not present

## 2013-11-18 DIAGNOSIS — L819 Disorder of pigmentation, unspecified: Secondary | ICD-10-CM | POA: Diagnosis not present

## 2013-11-18 DIAGNOSIS — L821 Other seborrheic keratosis: Secondary | ICD-10-CM | POA: Diagnosis not present

## 2013-11-18 DIAGNOSIS — Z85828 Personal history of other malignant neoplasm of skin: Secondary | ICD-10-CM | POA: Diagnosis not present

## 2013-11-18 DIAGNOSIS — L719 Rosacea, unspecified: Secondary | ICD-10-CM | POA: Diagnosis not present

## 2013-11-18 DIAGNOSIS — D1801 Hemangioma of skin and subcutaneous tissue: Secondary | ICD-10-CM | POA: Diagnosis not present

## 2013-11-18 DIAGNOSIS — L84 Corns and callosities: Secondary | ICD-10-CM | POA: Diagnosis not present

## 2013-11-18 DIAGNOSIS — Z85820 Personal history of malignant melanoma of skin: Secondary | ICD-10-CM | POA: Diagnosis not present

## 2013-11-18 DIAGNOSIS — D2330 Other benign neoplasm of skin of unspecified part of face: Secondary | ICD-10-CM | POA: Diagnosis not present

## 2014-02-21 DIAGNOSIS — L84 Corns and callosities: Secondary | ICD-10-CM | POA: Diagnosis not present

## 2014-02-21 DIAGNOSIS — M2041 Other hammer toe(s) (acquired), right foot: Secondary | ICD-10-CM | POA: Diagnosis not present

## 2014-02-23 DIAGNOSIS — L718 Other rosacea: Secondary | ICD-10-CM | POA: Diagnosis not present

## 2014-02-23 DIAGNOSIS — H04203 Unspecified epiphora, bilateral lacrimal glands: Secondary | ICD-10-CM | POA: Diagnosis not present

## 2014-02-23 DIAGNOSIS — H01006 Unspecified blepharitis left eye, unspecified eyelid: Secondary | ICD-10-CM | POA: Diagnosis not present

## 2014-02-23 DIAGNOSIS — H2513 Age-related nuclear cataract, bilateral: Secondary | ICD-10-CM | POA: Diagnosis not present

## 2014-03-14 ENCOUNTER — Telehealth: Payer: Self-pay | Admitting: Cardiology

## 2014-03-14 NOTE — Telephone Encounter (Signed)
Closed encounter °

## 2014-03-17 ENCOUNTER — Ambulatory Visit (INDEPENDENT_AMBULATORY_CARE_PROVIDER_SITE_OTHER): Payer: Medicare Other | Admitting: Cardiology

## 2014-03-17 ENCOUNTER — Encounter: Payer: Self-pay | Admitting: Cardiology

## 2014-03-17 VITALS — BP 128/76 | HR 66 | Ht 64.0 in | Wt 111.6 lb

## 2014-03-17 DIAGNOSIS — I059 Rheumatic mitral valve disease, unspecified: Secondary | ICD-10-CM | POA: Diagnosis not present

## 2014-03-17 DIAGNOSIS — I7789 Other specified disorders of arteries and arterioles: Secondary | ICD-10-CM | POA: Diagnosis not present

## 2014-03-17 DIAGNOSIS — R600 Localized edema: Secondary | ICD-10-CM | POA: Insufficient documentation

## 2014-03-17 DIAGNOSIS — R609 Edema, unspecified: Secondary | ICD-10-CM | POA: Diagnosis not present

## 2014-03-17 DIAGNOSIS — M1990 Unspecified osteoarthritis, unspecified site: Secondary | ICD-10-CM | POA: Insufficient documentation

## 2014-03-17 DIAGNOSIS — I773 Arterial fibromuscular dysplasia: Secondary | ICD-10-CM | POA: Insufficient documentation

## 2014-03-17 DIAGNOSIS — R0789 Other chest pain: Secondary | ICD-10-CM

## 2014-03-17 NOTE — Patient Instructions (Signed)
Your physician recommends that you schedule a follow-up appointment in: 3-4 months with Dr. Stanford Breed.

## 2014-03-17 NOTE — Assessment & Plan Note (Signed)
1-59% Rt RAS by RA doppler Aug 2012 

## 2014-03-17 NOTE — Assessment & Plan Note (Signed)
She is in the office today with complaints of LE edema

## 2014-03-17 NOTE — Assessment & Plan Note (Signed)
Atypical for angina, I suspect this is GERD

## 2014-03-17 NOTE — Progress Notes (Signed)
03/17/2014 Marzetta Merino   1932/04/25  [id number]  Primary Physician Jerlyn Ly, MD Primary Cardiologist: Dr Stanford Breed  HPI:  81 y/o female with a history of MVP and FMD of renal artery. She is in the office today with complaints of LE edema. She also mentioned some epigastric discomfort and jaw pain. Her symptoms are not exertional and not associated with unusual dyspnea. She exercises regularly without problems. She feels her symptoms are improved since she switched to a "gluen free" diet.    Current Outpatient Prescriptions  Medication Sig Dispense Refill  . atenolol (TENORMIN) 25 MG tablet 1/2 tab po qd 90 tablet 4  . Bioflavonoid Products (ESTER-C) 500-200-60 MG TABS Take 1 tablet by mouth daily.    . Cholecalciferol (VITAMIN D3) 1000 UNITS CAPS Take by mouth.    . Coenzyme Q10 (CO Q 10 PO) Take 1 tablet by mouth daily.    . Flaxseed, Linseed, (GROUND FLAX SEEDS) POWD Take by mouth.    Marland Kitchen LORazepam (ATIVAN) 0.5 MG tablet 1/4 of tablet as needed 30 tablet   . metroNIDAZOLE (METROGEL) 0.75 % gel Apply 1 application topically 2 (two) times daily.    . MULTIPLE VITAMIN PO Take by mouth.    . Multiple Vitamins-Minerals (ICAPS LUTEIN & ZEAXANTHIN PO) Take by mouth.    . Omega-3 Fatty Acids (OMEGA 3 PO) Take 1 tablet by mouth daily.    Marland Kitchen QUERCETIN PO Take by mouth.    . TURMERIC PO Take by mouth.    . vitamin E 200 UNIT capsule Take 200 Units by mouth daily.     No current facility-administered medications for this visit.    Allergies  Allergen Reactions  . Other     Antibiotics - causes GI upset    History   Social History  . Marital Status: Married    Spouse Name: N/A    Number of Children: N/A  . Years of Education: N/A   Occupational History  . Not on file.   Social History Main Topics  . Smoking status: Never Smoker   . Smokeless tobacco: Not on file  . Alcohol Use: Not on file  . Drug Use: Not on file  . Sexual Activity: Not on file   Other Topics  Concern  . Not on file   Social History Narrative     Review of Systems: General: negative for chills, fever, night sweats or weight changes.  Cardiovascular: negative for chest pain, dyspnea on exertion, edema, orthopnea, palpitations, paroxysmal nocturnal dyspnea or shortness of breath Dermatological: negative for rash Respiratory: negative for cough or wheezing Urologic: negative for hematuria Abdominal: negative for nausea, vomiting, diarrhea, bright red blood per rectum, melena, or hematemesis Neurologic: negative for visual changes, syncope, or dizziness All other systems reviewed and are otherwise negative except as noted above.    Blood pressure 128/76, pulse 66, height 5\' 4"  (1.626 m), weight 111 lb 9.6 oz (50.621 kg).  General appearance: alert, cooperative, cachectic and no distress Lungs: clear to auscultation bilaterally Heart: regular rate and rhythm Extremities: no edema  EKG NSR  ASSESSMENT AND PLAN:   Edema of both legs She is in the office today with complaints of LE edema  Chest pain Atypical for angina, I suspect this is GERD  Renal fibromuscular dysplasia 1-59% Rt RAS by RA doppler Aug 2012  Mitral valve disorder MVP history, not seen on her echo Aug 2012   PLAN  I did not change her medications. I encouraged her  to watch her sodium intake and elevate her legs as much as possible. If she has any change in her chest discomfort symptoms I would proceed with further evaluation- Myoview or echo.   [facility] 03/17/2014 2:44 PM

## 2014-03-17 NOTE — Assessment & Plan Note (Signed)
MVP history, not seen on her echo Aug 2012

## 2014-05-11 DIAGNOSIS — N838 Other noninflammatory disorders of ovary, fallopian tube and broad ligament: Secondary | ICD-10-CM | POA: Diagnosis not present

## 2014-05-11 DIAGNOSIS — M5380 Other specified dorsopathies, site unspecified: Secondary | ICD-10-CM | POA: Diagnosis not present

## 2014-05-11 DIAGNOSIS — E559 Vitamin D deficiency, unspecified: Secondary | ICD-10-CM | POA: Diagnosis not present

## 2014-05-11 DIAGNOSIS — Z681 Body mass index (BMI) 19 or less, adult: Secondary | ICD-10-CM | POA: Diagnosis not present

## 2014-05-11 DIAGNOSIS — R109 Unspecified abdominal pain: Secondary | ICD-10-CM | POA: Diagnosis not present

## 2014-05-12 DIAGNOSIS — N8320 Unspecified ovarian cysts: Secondary | ICD-10-CM | POA: Diagnosis not present

## 2014-05-12 DIAGNOSIS — N281 Cyst of kidney, acquired: Secondary | ICD-10-CM | POA: Diagnosis not present

## 2014-06-29 DIAGNOSIS — H01006 Unspecified blepharitis left eye, unspecified eyelid: Secondary | ICD-10-CM | POA: Diagnosis not present

## 2014-06-29 DIAGNOSIS — H3530 Unspecified macular degeneration: Secondary | ICD-10-CM | POA: Diagnosis not present

## 2014-06-29 DIAGNOSIS — L718 Other rosacea: Secondary | ICD-10-CM | POA: Diagnosis not present

## 2014-06-29 DIAGNOSIS — H2513 Age-related nuclear cataract, bilateral: Secondary | ICD-10-CM | POA: Diagnosis not present

## 2014-07-19 ENCOUNTER — Other Ambulatory Visit: Payer: Self-pay | Admitting: Internal Medicine

## 2014-07-19 DIAGNOSIS — Z0000 Encounter for general adult medical examination without abnormal findings: Secondary | ICD-10-CM | POA: Diagnosis not present

## 2014-07-19 DIAGNOSIS — E559 Vitamin D deficiency, unspecified: Secondary | ICD-10-CM | POA: Diagnosis not present

## 2014-07-19 DIAGNOSIS — Z79899 Other long term (current) drug therapy: Secondary | ICD-10-CM | POA: Diagnosis not present

## 2014-07-19 DIAGNOSIS — D72818 Other decreased white blood cell count: Secondary | ICD-10-CM | POA: Diagnosis not present

## 2014-07-19 DIAGNOSIS — Z9889 Other specified postprocedural states: Secondary | ICD-10-CM | POA: Diagnosis not present

## 2014-07-19 DIAGNOSIS — N644 Mastodynia: Secondary | ICD-10-CM

## 2014-07-20 ENCOUNTER — Other Ambulatory Visit: Payer: Self-pay | Admitting: Internal Medicine

## 2014-07-20 DIAGNOSIS — N644 Mastodynia: Secondary | ICD-10-CM

## 2014-07-21 ENCOUNTER — Ambulatory Visit
Admission: RE | Admit: 2014-07-21 | Discharge: 2014-07-21 | Disposition: A | Discharge status: Home / Self Care | Payer: Medicare Other | Source: Ambulatory Visit | Attending: Internal Medicine | Admitting: Internal Medicine

## 2014-07-21 ENCOUNTER — Encounter (INDEPENDENT_AMBULATORY_CARE_PROVIDER_SITE_OTHER): Payer: Self-pay

## 2014-07-21 DIAGNOSIS — N644 Mastodynia: Secondary | ICD-10-CM

## 2014-07-21 DIAGNOSIS — R922 Inconclusive mammogram: Secondary | ICD-10-CM | POA: Diagnosis not present

## 2014-07-26 DIAGNOSIS — N281 Cyst of kidney, acquired: Secondary | ICD-10-CM | POA: Diagnosis not present

## 2014-07-26 DIAGNOSIS — Z681 Body mass index (BMI) 19 or less, adult: Secondary | ICD-10-CM | POA: Diagnosis not present

## 2014-07-26 DIAGNOSIS — I341 Nonrheumatic mitral (valve) prolapse: Secondary | ICD-10-CM | POA: Diagnosis not present

## 2014-07-26 DIAGNOSIS — I517 Cardiomegaly: Secondary | ICD-10-CM | POA: Diagnosis not present

## 2014-07-26 DIAGNOSIS — Z0000 Encounter for general adult medical examination without abnormal findings: Secondary | ICD-10-CM | POA: Diagnosis not present

## 2014-07-26 DIAGNOSIS — E559 Vitamin D deficiency, unspecified: Secondary | ICD-10-CM | POA: Diagnosis not present

## 2014-07-26 DIAGNOSIS — H40009 Preglaucoma, unspecified, unspecified eye: Secondary | ICD-10-CM | POA: Diagnosis not present

## 2014-07-26 DIAGNOSIS — Z1389 Encounter for screening for other disorder: Secondary | ICD-10-CM | POA: Diagnosis not present

## 2014-07-26 DIAGNOSIS — M1990 Unspecified osteoarthritis, unspecified site: Secondary | ICD-10-CM | POA: Diagnosis not present

## 2014-07-26 DIAGNOSIS — M797 Fibromyalgia: Secondary | ICD-10-CM | POA: Diagnosis not present

## 2014-07-26 DIAGNOSIS — M545 Low back pain: Secondary | ICD-10-CM | POA: Diagnosis not present

## 2014-07-26 DIAGNOSIS — J439 Emphysema, unspecified: Secondary | ICD-10-CM | POA: Diagnosis not present

## 2014-07-29 DIAGNOSIS — L814 Other melanin hyperpigmentation: Secondary | ICD-10-CM | POA: Diagnosis not present

## 2014-07-29 DIAGNOSIS — L718 Other rosacea: Secondary | ICD-10-CM | POA: Diagnosis not present

## 2014-07-29 DIAGNOSIS — Z85828 Personal history of other malignant neoplasm of skin: Secondary | ICD-10-CM | POA: Diagnosis not present

## 2014-07-29 DIAGNOSIS — D1801 Hemangioma of skin and subcutaneous tissue: Secondary | ICD-10-CM | POA: Diagnosis not present

## 2014-07-29 DIAGNOSIS — L570 Actinic keratosis: Secondary | ICD-10-CM | POA: Diagnosis not present

## 2014-07-29 DIAGNOSIS — D225 Melanocytic nevi of trunk: Secondary | ICD-10-CM | POA: Diagnosis not present

## 2014-07-29 DIAGNOSIS — L821 Other seborrheic keratosis: Secondary | ICD-10-CM | POA: Diagnosis not present

## 2014-08-03 DIAGNOSIS — Z1212 Encounter for screening for malignant neoplasm of rectum: Secondary | ICD-10-CM | POA: Diagnosis not present

## 2014-08-10 DIAGNOSIS — H2513 Age-related nuclear cataract, bilateral: Secondary | ICD-10-CM | POA: Diagnosis not present

## 2014-08-10 DIAGNOSIS — Z85820 Personal history of malignant melanoma of skin: Secondary | ICD-10-CM | POA: Diagnosis not present

## 2014-08-10 DIAGNOSIS — Z86010 Personal history of colonic polyps: Secondary | ICD-10-CM | POA: Diagnosis not present

## 2014-08-10 DIAGNOSIS — I7789 Other specified disorders of arteries and arterioles: Secondary | ICD-10-CM | POA: Diagnosis not present

## 2014-08-10 DIAGNOSIS — I10 Essential (primary) hypertension: Secondary | ICD-10-CM | POA: Diagnosis not present

## 2014-08-10 DIAGNOSIS — I701 Atherosclerosis of renal artery: Secondary | ICD-10-CM | POA: Diagnosis not present

## 2014-08-23 DIAGNOSIS — I10 Essential (primary) hypertension: Secondary | ICD-10-CM | POA: Diagnosis not present

## 2014-08-23 DIAGNOSIS — R600 Localized edema: Secondary | ICD-10-CM | POA: Diagnosis not present

## 2014-08-23 DIAGNOSIS — H25812 Combined forms of age-related cataract, left eye: Secondary | ICD-10-CM | POA: Diagnosis not present

## 2014-09-15 DIAGNOSIS — I10 Essential (primary) hypertension: Secondary | ICD-10-CM | POA: Diagnosis not present

## 2014-09-15 DIAGNOSIS — M1990 Unspecified osteoarthritis, unspecified site: Secondary | ICD-10-CM | POA: Diagnosis not present

## 2014-09-15 DIAGNOSIS — Z888 Allergy status to other drugs, medicaments and biological substances status: Secondary | ICD-10-CM | POA: Diagnosis not present

## 2014-09-15 DIAGNOSIS — Z85820 Personal history of malignant melanoma of skin: Secondary | ICD-10-CM | POA: Diagnosis not present

## 2014-09-15 DIAGNOSIS — H25811 Combined forms of age-related cataract, right eye: Secondary | ICD-10-CM | POA: Diagnosis not present

## 2014-09-15 DIAGNOSIS — Z83518 Family history of other specified eye disorder: Secondary | ICD-10-CM | POA: Diagnosis not present

## 2014-09-15 DIAGNOSIS — Z79899 Other long term (current) drug therapy: Secondary | ICD-10-CM | POA: Diagnosis not present

## 2014-09-15 DIAGNOSIS — I773 Arterial fibromuscular dysplasia: Secondary | ICD-10-CM | POA: Diagnosis not present

## 2014-09-15 DIAGNOSIS — Z881 Allergy status to other antibiotic agents status: Secondary | ICD-10-CM | POA: Diagnosis not present

## 2014-09-15 DIAGNOSIS — H2513 Age-related nuclear cataract, bilateral: Secondary | ICD-10-CM | POA: Diagnosis not present

## 2014-09-16 DIAGNOSIS — Z961 Presence of intraocular lens: Secondary | ICD-10-CM | POA: Insufficient documentation

## 2014-11-17 DIAGNOSIS — Z961 Presence of intraocular lens: Secondary | ICD-10-CM | POA: Diagnosis not present

## 2015-01-18 ENCOUNTER — Ambulatory Visit: Payer: Medicare Other | Admitting: Cardiovascular Disease

## 2015-01-18 ENCOUNTER — Telehealth: Payer: Self-pay | Admitting: Cardiology

## 2015-01-18 NOTE — Telephone Encounter (Signed)
Ashley Washington is calling because she is having some chest tightness and wants to come for an EKG, please call   Thanks

## 2015-01-18 NOTE — Telephone Encounter (Signed)
Discussed symptoms w/ patient. She reports CP w/ jaw involvement last night. Reports active CP this AM.  I recommended ED visit, explained rationale for this. She adamantly refused. Asked if she can be seen in office.  Restated rationale/appropriateness of Ed for evaluation, patient asked "Well, why can't I be seen? I've had this kind of chest pain before."  Pt requested EKG, provider accomodation. Asked for Dr. Stanford Breed. Informed her Dr. Stanford Breed is out of office, may be able to set up to see extender or DoD. Discussed w/ Dr. Oval Linsey who agreed w/ appropriateness of ED eval given active symptoms. She advised if pt refused to go to ED, could bring for EKG, make further recommendation on findings.  I called pt back, no answer when dialed. LM for patient to call.

## 2015-01-18 NOTE — Telephone Encounter (Signed)
Called patient, left message w/ instruction to return call.

## 2015-01-19 ENCOUNTER — Other Ambulatory Visit (HOSPITAL_COMMUNITY): Payer: Self-pay | Admitting: Internal Medicine

## 2015-01-19 ENCOUNTER — Telehealth: Payer: Self-pay | Admitting: Physician Assistant

## 2015-01-19 ENCOUNTER — Ambulatory Visit (HOSPITAL_COMMUNITY)
Admission: RE | Admit: 2015-01-19 | Discharge: 2015-01-19 | Disposition: A | Discharge status: Home / Self Care | Payer: Medicare Other | Source: Ambulatory Visit | Attending: Internal Medicine | Admitting: Internal Medicine

## 2015-01-19 ENCOUNTER — Encounter (HOSPITAL_COMMUNITY): Payer: Self-pay

## 2015-01-19 DIAGNOSIS — Z681 Body mass index (BMI) 19 or less, adult: Secondary | ICD-10-CM | POA: Diagnosis not present

## 2015-01-19 DIAGNOSIS — R079 Chest pain, unspecified: Secondary | ICD-10-CM | POA: Diagnosis not present

## 2015-01-19 DIAGNOSIS — J90 Pleural effusion, not elsewhere classified: Secondary | ICD-10-CM | POA: Diagnosis not present

## 2015-01-19 DIAGNOSIS — I440 Atrioventricular block, first degree: Secondary | ICD-10-CM | POA: Diagnosis not present

## 2015-01-19 DIAGNOSIS — R0789 Other chest pain: Secondary | ICD-10-CM | POA: Diagnosis not present

## 2015-01-19 DIAGNOSIS — R000 Tachycardia, unspecified: Secondary | ICD-10-CM | POA: Insufficient documentation

## 2015-01-19 DIAGNOSIS — R42 Dizziness and giddiness: Secondary | ICD-10-CM | POA: Diagnosis not present

## 2015-01-19 DIAGNOSIS — I341 Nonrheumatic mitral (valve) prolapse: Secondary | ICD-10-CM | POA: Insufficient documentation

## 2015-01-19 HISTORY — DX: Malignant (primary) neoplasm, unspecified: C80.1

## 2015-01-19 HISTORY — DX: Essential (primary) hypertension: I10

## 2015-01-19 MED ORDER — IOHEXOL 350 MG/ML SOLN
80.0000 mL | Freq: Once | INTRAVENOUS | Status: AC | PRN
  Administered 2015-01-19: 80 mL via INTRAVENOUS

## 2015-01-19 NOTE — Telephone Encounter (Signed)
01/19/2015 Received faxed referral from [facility] for upcoming appointment on with Tarri Fuller, PA-C on 02/06/2015.  Records given to [facility].  cbr

## 2015-01-23 NOTE — Telephone Encounter (Signed)
   Pt calls to report rash noted after CT Angio Chest performed 11/10.  She states rash is located in pelvic area in folds of legs and around low abdomen Reddened and itchy to her. She has used benadryl and this has calmed itchy feeling. Denies sob; or N/V  Suggested to pt that this rash may not be related to contrast at all. Commonly contrast allergy is more diffuse over most areas of skin instead of localized.  Seems like it might be related to a fungal type infection according to her description and localization.  Rec: hydrocortisone cream and or fungal cream to areas If continues to be an issue Pleas see family MD

## 2015-02-06 ENCOUNTER — Encounter: Payer: Medicare Other | Admitting: Physician Assistant

## 2015-02-07 ENCOUNTER — Telehealth: Payer: Self-pay | Admitting: Cardiology

## 2015-02-07 NOTE — Telephone Encounter (Signed)
Received records from [facility] for appointment on 02/09/15 with Dr Stanford Breed.  Records given to [facility] (medical records) for Dr Jacalyn Lefevre schedule 02/09/15. lp

## 2015-02-07 NOTE — Progress Notes (Signed)
Patient ID: Ashley Washington, female   DOB: 1932-10-28, 80 y.o.   MRN: [id number] Opened in Error

## 2015-02-08 ENCOUNTER — Ambulatory Visit: Payer: Medicare Other | Admitting: Physician Assistant

## 2015-02-09 ENCOUNTER — Ambulatory Visit (INDEPENDENT_AMBULATORY_CARE_PROVIDER_SITE_OTHER): Payer: Medicare Other | Admitting: Cardiology

## 2015-02-09 ENCOUNTER — Encounter: Payer: Self-pay | Admitting: Cardiology

## 2015-02-09 VITALS — BP 118/70 | HR 60 | Ht 65.0 in | Wt 108.4 lb

## 2015-02-09 DIAGNOSIS — R011 Cardiac murmur, unspecified: Secondary | ICD-10-CM

## 2015-02-09 DIAGNOSIS — R002 Palpitations: Secondary | ICD-10-CM | POA: Diagnosis not present

## 2015-02-09 DIAGNOSIS — R079 Chest pain, unspecified: Secondary | ICD-10-CM | POA: Diagnosis not present

## 2015-02-09 NOTE — Patient Instructions (Signed)
Medication Instructions:  Please continue your current medications.  Labwork: NONE  Testing/Procedures: Your physician has requested that you have an echocardiogram. This will be done at our [facility] location, [address]. Echocardiography is a painless test that uses sound waves to create images of your heart. It provides your doctor with information about the size and shape of your heart and how well your heart's chambers and valves are working. This procedure takes approximately one hour. There are no restrictions for this procedure.  Your physician has requested that you have an exercise tolerance test. For further information please visit [email or web address]. Please also follow instruction sheet, as given.  Follow-Up: Dr Stanford Breed recommends that you schedule a follow-up appointment in 1 year. You will receive a reminder letter in the mail two months in advance. If you don't receive a letter, please call our office to schedule the follow-up appointment.  If you need a refill on your cardiac medications before your next appointment, please call your pharmacy.

## 2015-02-09 NOTE — Assessment & Plan Note (Signed)
Symptoms atypical. Plan exercise treadmill for risk stratification. 

## 2015-02-09 NOTE — Assessment & Plan Note (Signed)
Plan repeat echocardiogram based on 2/6 apical systolic murmur.

## 2015-02-09 NOTE — Progress Notes (Signed)
HPI: FU palpitations, fibromuscular dysplasia of her right renal artery, and chest pain. Most recent Myoview in October of 2010 showed normal LV function and normal perfusion. Her last renal Dopplers in August of 2012 showed 1-59% on the right and normal left. Large cyst in left kidney. Last echocardiogram in August of 2012 showed normal LV function, mild to moderate TR and a trivial pericardial effusion. CTA November 2016 showed no pulmonary embolus.Since last seen, Approximately 2 weeks ago she had an episode of chest pain. It was diffuse radiating to her jaw. No associated symptoms. Not pleuritic, positional or exertional. She otherwise does not have exertional chest pain, dyspnea on exertion, orthopnea, PND or syncope.  Current Outpatient Prescriptions  Medication Sig Dispense Refill  . atenolol (TENORMIN) 25 MG tablet 1/2 tab po qd 90 tablet 4  . Bioflavonoid Products (ESTER-C) 500-200-60 MG TABS Take 1 tablet by mouth daily.    . Cholecalciferol (VITAMIN D3) 1000 UNITS CAPS Take by mouth.    . Coenzyme Q10 (CO Q 10 PO) Take 50 mg by mouth daily.     . Flaxseed, Linseed, (GROUND FLAX SEEDS) POWD Take by mouth.    Marland Kitchen LORazepam (ATIVAN) 0.5 MG tablet 1/4 of tablet as needed 30 tablet   . meclizine (ANTIVERT) 25 MG tablet Take 1 tablet by mouth daily as needed.    . metroNIDAZOLE (METROGEL) 0.75 % gel Apply 1 application topically 2 (two) times daily.    . MULTIPLE VITAMIN PO Take by mouth.    . Multiple Vitamins-Minerals (ICAPS LUTEIN & ZEAXANTHIN PO) Take by mouth.    . Omega-3 Fatty Acids (OMEGA 3 PO) Take 1 tablet by mouth daily.    Marland Kitchen QUERCETIN PO Take by mouth.    . TURMERIC PO Take by mouth.    . vitamin E 200 UNIT capsule Take 200 Units by mouth daily.     No current facility-administered medications for this visit.     Past Medical History  Diagnosis Date  . Mitral valve prolapse   . Fibromuscular dysplasia of renal artery ([place])   . Thyroid disease   . Cancer ([place])   .  Hypertension     Past Surgical History  Procedure Laterality Date  . Appendectomy    . Abdominal hysterectomy    . Tonsillectomy    . Parathyroidectomy      Social History   Social History  . Marital Status: Married    Spouse Name: N/A  . Number of Children: N/A  . Years of Education: N/A   Occupational History  . Not on file.   Social History Main Topics  . Smoking status: Never Smoker   . Smokeless tobacco: Not on file  . Alcohol Use: Not on file  . Drug Use: Not on file  . Sexual Activity: Not on file   Other Topics Concern  . Not on file   Social History Narrative    ROS: no fevers or chills, productive cough, hemoptysis, dysphasia, odynophagia, melena, hematochezia, dysuria, hematuria, rash, seizure activity, orthopnea, PND, pedal edema, claudication. Remaining systems are negative.  Physical Exam: Well-developed well-nourished in no acute distress.  Skin is warm and dry.  HEENT is normal.  Neck is supple.  Chest is clear to auscultation with normal expansion.  Cardiovascular exam is regular rate and rhythm. 2/6 systolic murmur apex. Abdominal exam nontender or distended. No masses palpated. Extremities show no edema. neuro grossly intact  ECG 01/19/2015-sinus rhythm with no ST changes.

## 2015-02-09 NOTE — Assessment & Plan Note (Signed)
Continue beta blocker. 

## 2015-03-07 DIAGNOSIS — D4989 Neoplasm of unspecified behavior of other specified sites: Secondary | ICD-10-CM | POA: Diagnosis not present

## 2015-03-07 DIAGNOSIS — L718 Other rosacea: Secondary | ICD-10-CM | POA: Diagnosis not present

## 2015-03-07 DIAGNOSIS — Z961 Presence of intraocular lens: Secondary | ICD-10-CM | POA: Diagnosis not present

## 2015-03-07 DIAGNOSIS — H3530 Unspecified macular degeneration: Secondary | ICD-10-CM | POA: Diagnosis not present

## 2015-03-16 ENCOUNTER — Encounter (HOSPITAL_COMMUNITY): Payer: Medicare Other

## 2015-03-16 ENCOUNTER — Ambulatory Visit (HOSPITAL_COMMUNITY): Payer: Medicare Other

## 2015-03-16 ENCOUNTER — Encounter: Payer: Medicare Other | Admitting: Cardiology

## 2015-03-22 DIAGNOSIS — H01004 Unspecified blepharitis left upper eyelid: Secondary | ICD-10-CM | POA: Diagnosis not present

## 2015-03-22 DIAGNOSIS — H01005 Unspecified blepharitis left lower eyelid: Secondary | ICD-10-CM | POA: Diagnosis not present

## 2015-03-22 DIAGNOSIS — H0289 Other specified disorders of eyelid: Secondary | ICD-10-CM | POA: Diagnosis not present

## 2015-03-22 DIAGNOSIS — H01002 Unspecified blepharitis right lower eyelid: Secondary | ICD-10-CM | POA: Diagnosis not present

## 2015-03-29 DIAGNOSIS — D485 Neoplasm of uncertain behavior of skin: Secondary | ICD-10-CM | POA: Diagnosis not present

## 2015-03-29 DIAGNOSIS — L820 Inflamed seborrheic keratosis: Secondary | ICD-10-CM | POA: Diagnosis not present

## 2015-03-29 DIAGNOSIS — L718 Other rosacea: Secondary | ICD-10-CM | POA: Diagnosis not present

## 2015-03-29 DIAGNOSIS — L821 Other seborrheic keratosis: Secondary | ICD-10-CM | POA: Diagnosis not present

## 2015-05-29 ENCOUNTER — Ambulatory Visit (HOSPITAL_COMMUNITY): Payer: Medicare Other | Attending: Cardiology

## 2015-05-29 ENCOUNTER — Other Ambulatory Visit: Payer: Self-pay

## 2015-05-29 DIAGNOSIS — I071 Rheumatic tricuspid insufficiency: Secondary | ICD-10-CM | POA: Diagnosis not present

## 2015-05-29 DIAGNOSIS — I313 Pericardial effusion (noninflammatory): Secondary | ICD-10-CM | POA: Insufficient documentation

## 2015-05-29 DIAGNOSIS — I517 Cardiomegaly: Secondary | ICD-10-CM | POA: Diagnosis not present

## 2015-05-29 DIAGNOSIS — I340 Nonrheumatic mitral (valve) insufficiency: Secondary | ICD-10-CM | POA: Diagnosis not present

## 2015-05-29 DIAGNOSIS — R002 Palpitations: Secondary | ICD-10-CM | POA: Insufficient documentation

## 2015-05-29 DIAGNOSIS — R011 Cardiac murmur, unspecified: Secondary | ICD-10-CM

## 2015-05-29 DIAGNOSIS — I5189 Other ill-defined heart diseases: Secondary | ICD-10-CM | POA: Diagnosis not present

## 2015-06-14 DIAGNOSIS — H0288B Meibomian gland dysfunction left eye, upper and lower eyelids: Secondary | ICD-10-CM | POA: Insufficient documentation

## 2015-06-14 DIAGNOSIS — Z961 Presence of intraocular lens: Secondary | ICD-10-CM | POA: Diagnosis not present

## 2015-06-14 DIAGNOSIS — H26492 Other secondary cataract, left eye: Secondary | ICD-10-CM | POA: Insufficient documentation

## 2015-06-14 DIAGNOSIS — H0289 Other specified disorders of eyelid: Secondary | ICD-10-CM | POA: Diagnosis not present

## 2015-07-24 ENCOUNTER — Other Ambulatory Visit: Payer: Self-pay

## 2015-07-24 DIAGNOSIS — Z1231 Encounter for screening mammogram for malignant neoplasm of breast: Secondary | ICD-10-CM

## 2015-07-25 DIAGNOSIS — I2510 Atherosclerotic heart disease of native coronary artery without angina pectoris: Secondary | ICD-10-CM | POA: Diagnosis not present

## 2015-07-25 DIAGNOSIS — Z0000 Encounter for general adult medical examination without abnormal findings: Secondary | ICD-10-CM | POA: Diagnosis not present

## 2015-07-25 DIAGNOSIS — M791 Myalgia: Secondary | ICD-10-CM | POA: Diagnosis not present

## 2015-07-25 DIAGNOSIS — F419 Anxiety disorder, unspecified: Secondary | ICD-10-CM | POA: Diagnosis not present

## 2015-07-25 DIAGNOSIS — D72819 Decreased white blood cell count, unspecified: Secondary | ICD-10-CM | POA: Diagnosis not present

## 2015-07-25 DIAGNOSIS — E559 Vitamin D deficiency, unspecified: Secondary | ICD-10-CM | POA: Diagnosis not present

## 2015-08-02 DIAGNOSIS — M545 Low back pain: Secondary | ICD-10-CM | POA: Diagnosis not present

## 2015-08-02 DIAGNOSIS — Z0000 Encounter for general adult medical examination without abnormal findings: Secondary | ICD-10-CM | POA: Diagnosis not present

## 2015-08-02 DIAGNOSIS — I517 Cardiomegaly: Secondary | ICD-10-CM | POA: Diagnosis not present

## 2015-08-02 DIAGNOSIS — Z1389 Encounter for screening for other disorder: Secondary | ICD-10-CM | POA: Diagnosis not present

## 2015-08-02 DIAGNOSIS — I700 Atherosclerosis of aorta: Secondary | ICD-10-CM | POA: Diagnosis not present

## 2015-08-02 DIAGNOSIS — M1990 Unspecified osteoarthritis, unspecified site: Secondary | ICD-10-CM | POA: Diagnosis not present

## 2015-08-02 DIAGNOSIS — Z23 Encounter for immunization: Secondary | ICD-10-CM | POA: Diagnosis not present

## 2015-08-02 DIAGNOSIS — M810 Age-related osteoporosis without current pathological fracture: Secondary | ICD-10-CM | POA: Diagnosis not present

## 2015-08-02 DIAGNOSIS — Z681 Body mass index (BMI) 19 or less, adult: Secondary | ICD-10-CM | POA: Diagnosis not present

## 2015-08-02 DIAGNOSIS — I2510 Atherosclerotic heart disease of native coronary artery without angina pectoris: Secondary | ICD-10-CM | POA: Diagnosis not present

## 2015-08-02 DIAGNOSIS — I440 Atrioventricular block, first degree: Secondary | ICD-10-CM | POA: Diagnosis not present

## 2015-08-02 DIAGNOSIS — I341 Nonrheumatic mitral (valve) prolapse: Secondary | ICD-10-CM | POA: Diagnosis not present

## 2015-08-03 DIAGNOSIS — L821 Other seborrheic keratosis: Secondary | ICD-10-CM | POA: Diagnosis not present

## 2015-08-03 DIAGNOSIS — D224 Melanocytic nevi of scalp and neck: Secondary | ICD-10-CM | POA: Diagnosis not present

## 2015-08-03 DIAGNOSIS — L245 Irritant contact dermatitis due to other chemical products: Secondary | ICD-10-CM | POA: Diagnosis not present

## 2015-08-03 DIAGNOSIS — L570 Actinic keratosis: Secondary | ICD-10-CM | POA: Diagnosis not present

## 2015-08-03 DIAGNOSIS — Z85820 Personal history of malignant melanoma of skin: Secondary | ICD-10-CM | POA: Diagnosis not present

## 2015-08-10 ENCOUNTER — Ambulatory Visit: Payer: Medicare Other

## 2015-08-22 DIAGNOSIS — E559 Vitamin D deficiency, unspecified: Secondary | ICD-10-CM | POA: Diagnosis not present

## 2015-08-22 DIAGNOSIS — M810 Age-related osteoporosis without current pathological fracture: Secondary | ICD-10-CM | POA: Diagnosis not present

## 2015-10-12 DIAGNOSIS — Z681 Body mass index (BMI) 19 or less, adult: Secondary | ICD-10-CM | POA: Diagnosis not present

## 2015-10-12 DIAGNOSIS — M810 Age-related osteoporosis without current pathological fracture: Secondary | ICD-10-CM | POA: Diagnosis not present

## 2015-12-29 DIAGNOSIS — H26492 Other secondary cataract, left eye: Secondary | ICD-10-CM | POA: Diagnosis not present

## 2016-02-28 IMAGING — CT CT ANGIO CHEST
2 of 6 series · 19 of 36 positions shown · IV contrast (Omni 300)
Comparison: Chest radiographs dated 04/15/2006

CLINICAL DATA: Chest pain, vertigo, tachycardia. Mitral valve
prolapse.

EXAM:
CT ANGIOGRAPHY CHEST WITH CONTRAST
TECHNIQUE: Multidetector CT imaging of the chest was performed using the
standard protocol during bolus administration of intravenous
contrast. Multiplanar CT image reconstructions and MIPs were
obtained to evaluate the vascular anatomy.
CONTRAST:  80mL OMNIPAQUE IOHEXOL 350 MG/ML SOLN

[Series 6: pe thins · axial · 0.74mm/px · z∈[-290,+5]mm · 18 of 654 slices shown]
[im 32/654  lung]
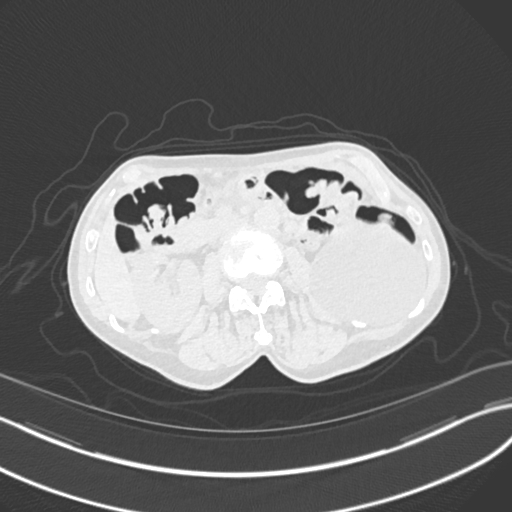
[im 63/654  mediastinal]
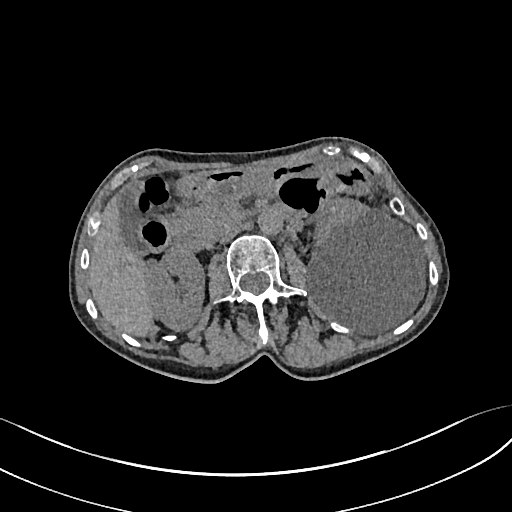
[im 94/654  lung]
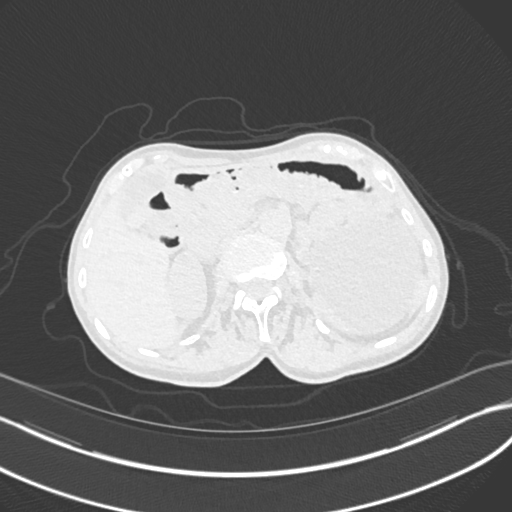
[im 125/654  mediastinal]
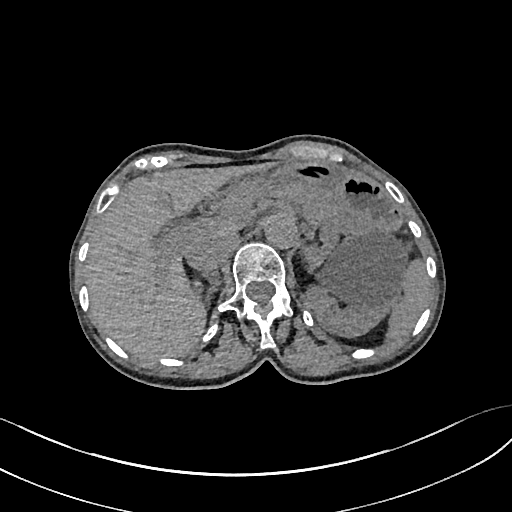
[im 187/654  lung]
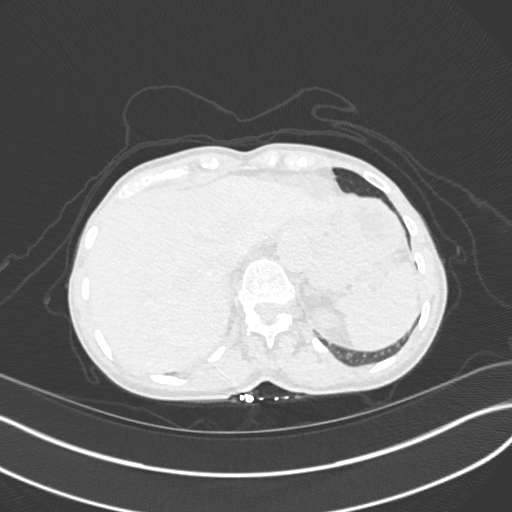
[im 218/654  mediastinal]
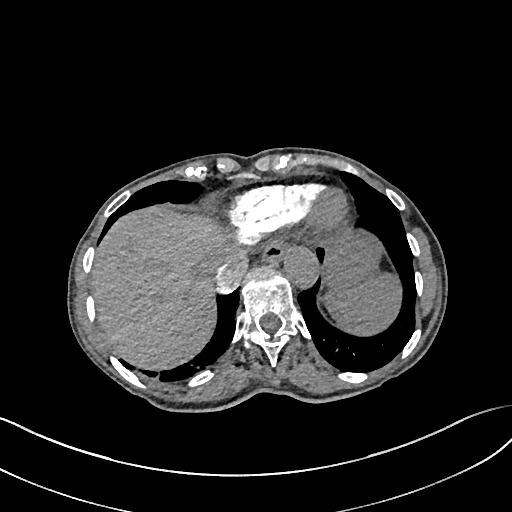
[im 249/654  lung]
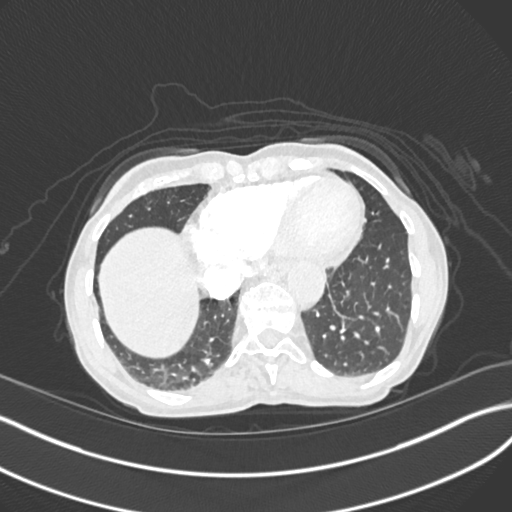
[im 280/654  mediastinal]
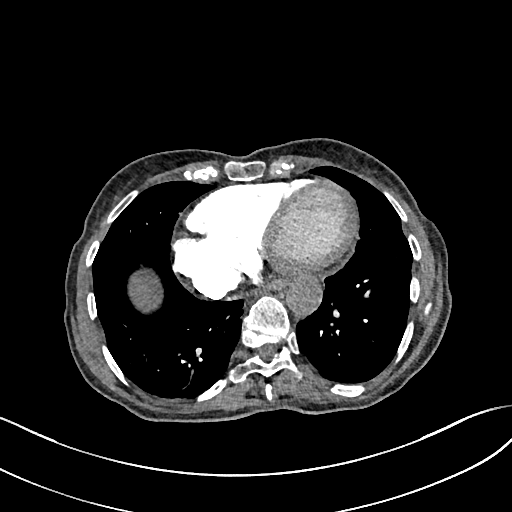
[im 311/654  lung]
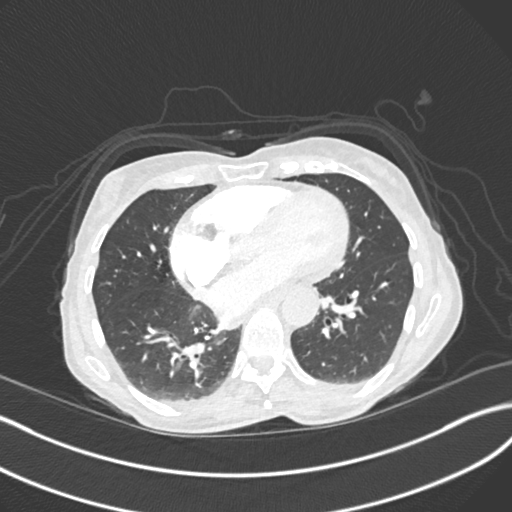
[im 343/654  mediastinal]
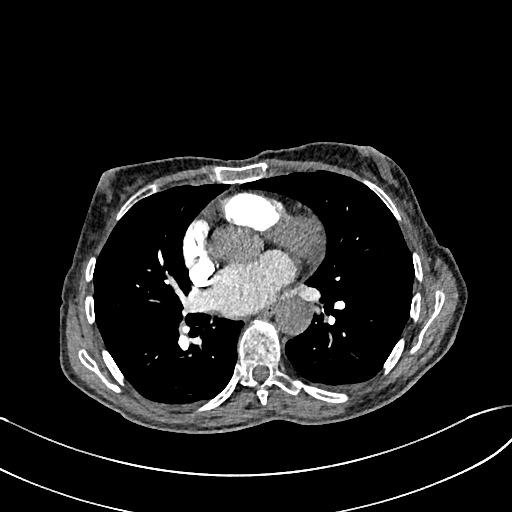
[im 374/654  lung]
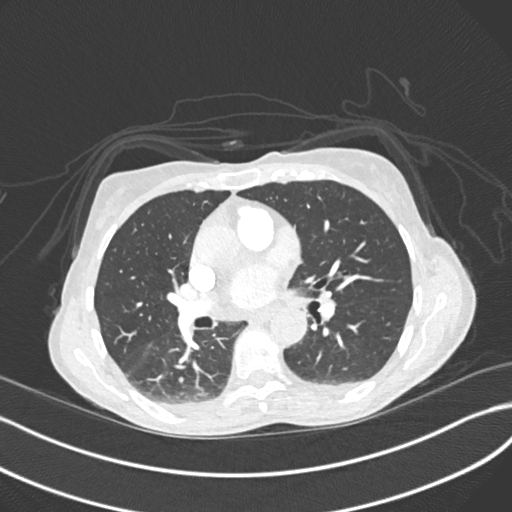
[im 405/654  mediastinal]
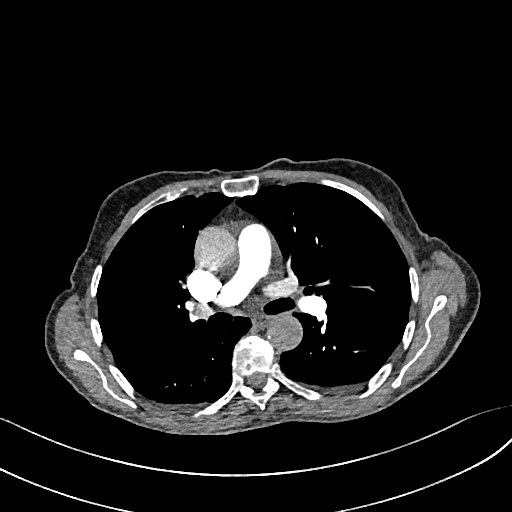
[im 436/654  lung]
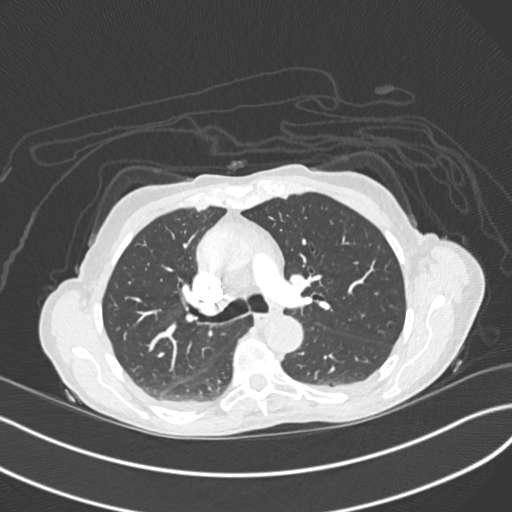
[im 498/654  mediastinal]
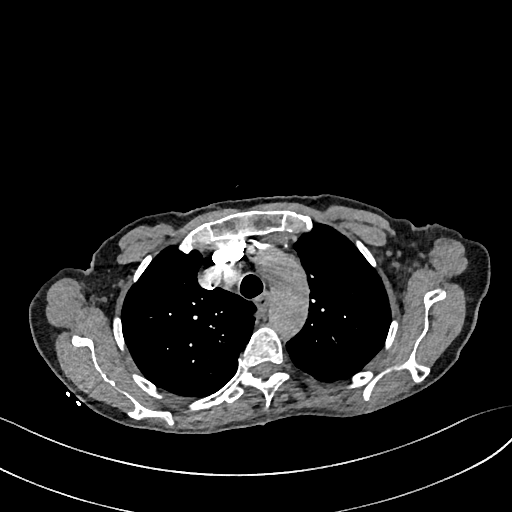
[im 529/654  lung]
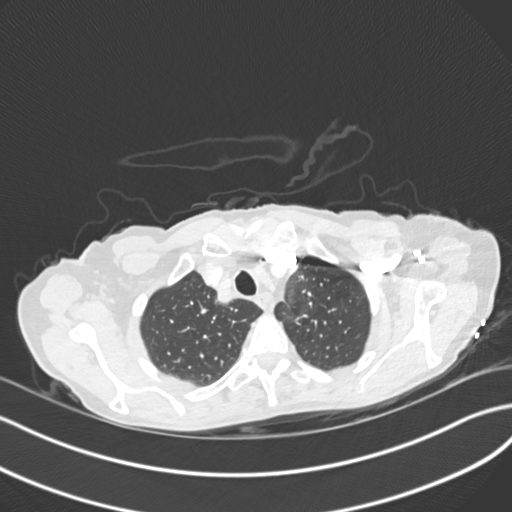
[im 560/654  mediastinal]
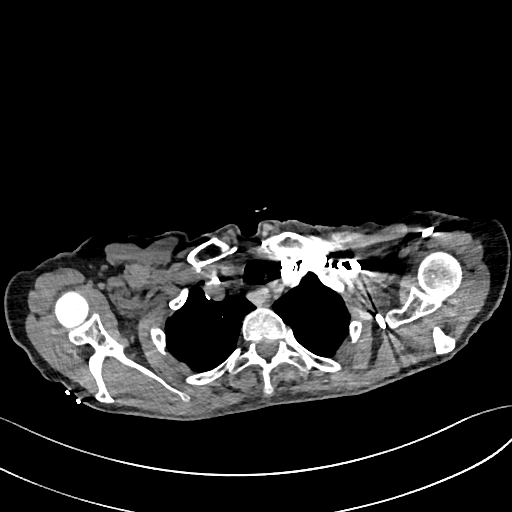
[im 591/654  lung]
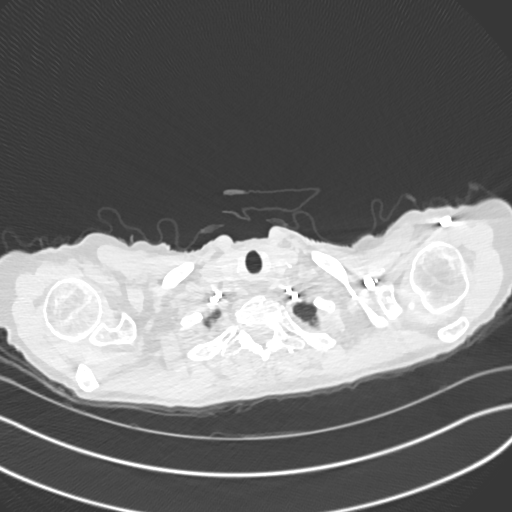
[im 622/654  mediastinal]
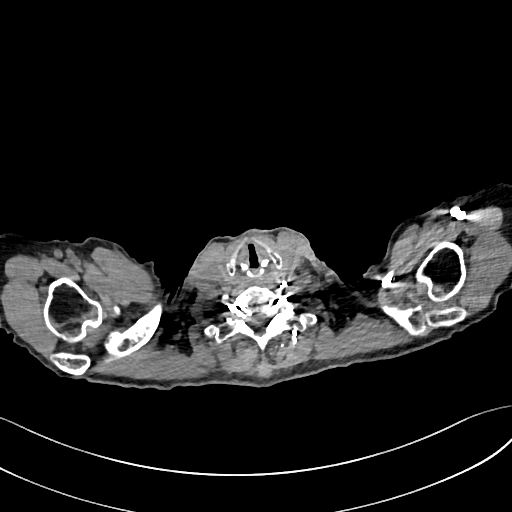

[Series 7: pe 2mm cor · coronal · 0.64mm/px · 1 of 111 slices shown]
[im 56/111  mediastinal]
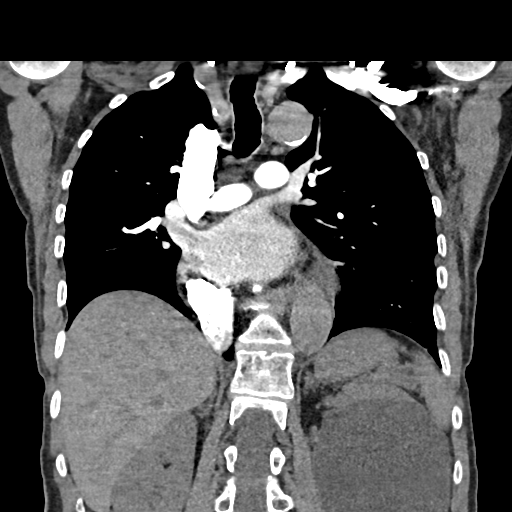

[19 of 36 positions shown; findings below may reference images not displayed]

FINDINGS: No evidence of pulmonary embolism.

Mediastinum/Nodes: Heart is top-normal in size. No pericardial
effusion.

Coronary atherosclerosis.

Atherosclerotic calcifications aortic arch.

No suspicious mediastinal, hilar, or axillary lymphadenopathy.

Visualized thyroid is unremarkable.

Lungs/Pleura: Calcified granuloma in the right lower lobe (series
5/image 97).

No suspicious pulmonary nodules.

Trace bilateral pleural effusions.

No focal consolidation.

No pneumothorax.

Upper abdomen: Visualized upper abdomen is notable for a 9.6 cm left
renal cyst (series 4/ image 146), a 10 mm right hepatic cyst (series
4/ image 105), and vascular calcifications.

Musculoskeletal: Degenerative changes of the visualized
thoracolumbar spine.

Review of the MIP images confirms the above findings.
IMPRESSION: No evidence of pulmonary embolism.

Trace bilateral pleural effusions.

## 2016-03-26 DIAGNOSIS — D1801 Hemangioma of skin and subcutaneous tissue: Secondary | ICD-10-CM | POA: Diagnosis not present

## 2016-03-26 DIAGNOSIS — D225 Melanocytic nevi of trunk: Secondary | ICD-10-CM | POA: Diagnosis not present

## 2016-03-26 DIAGNOSIS — L814 Other melanin hyperpigmentation: Secondary | ICD-10-CM | POA: Diagnosis not present

## 2016-03-26 DIAGNOSIS — L570 Actinic keratosis: Secondary | ICD-10-CM | POA: Diagnosis not present

## 2016-03-26 DIAGNOSIS — L821 Other seborrheic keratosis: Secondary | ICD-10-CM | POA: Diagnosis not present

## 2016-03-26 DIAGNOSIS — L720 Epidermal cyst: Secondary | ICD-10-CM | POA: Diagnosis not present

## 2016-03-26 DIAGNOSIS — L820 Inflamed seborrheic keratosis: Secondary | ICD-10-CM | POA: Diagnosis not present

## 2016-03-26 DIAGNOSIS — Z85820 Personal history of malignant melanoma of skin: Secondary | ICD-10-CM | POA: Diagnosis not present

## 2016-04-09 DIAGNOSIS — G4700 Insomnia, unspecified: Secondary | ICD-10-CM | POA: Diagnosis not present

## 2016-04-09 DIAGNOSIS — R4589 Other symptoms and signs involving emotional state: Secondary | ICD-10-CM | POA: Diagnosis not present

## 2016-04-09 DIAGNOSIS — Z124 Encounter for screening for malignant neoplasm of cervix: Secondary | ICD-10-CM | POA: Diagnosis not present

## 2016-04-09 DIAGNOSIS — N393 Stress incontinence (female) (male): Secondary | ICD-10-CM | POA: Diagnosis not present

## 2016-04-16 ENCOUNTER — Other Ambulatory Visit: Payer: Self-pay | Admitting: Internal Medicine

## 2016-04-16 DIAGNOSIS — Z1231 Encounter for screening mammogram for malignant neoplasm of breast: Secondary | ICD-10-CM

## 2016-05-14 ENCOUNTER — Ambulatory Visit: Payer: Medicare Other

## 2016-05-14 ENCOUNTER — Ambulatory Visit
Admission: RE | Admit: 2016-05-14 | Discharge: 2016-05-14 | Disposition: A | Discharge status: Home / Self Care | Payer: Medicare Other | Source: Ambulatory Visit | Attending: Internal Medicine | Admitting: Internal Medicine

## 2016-05-14 DIAGNOSIS — Z1231 Encounter for screening mammogram for malignant neoplasm of breast: Secondary | ICD-10-CM

## 2016-07-24 DIAGNOSIS — H0289 Other specified disorders of eyelid: Secondary | ICD-10-CM | POA: Diagnosis not present

## 2016-07-24 DIAGNOSIS — Z961 Presence of intraocular lens: Secondary | ICD-10-CM | POA: Diagnosis not present

## 2016-08-08 DIAGNOSIS — H9313 Tinnitus, bilateral: Secondary | ICD-10-CM | POA: Diagnosis not present

## 2016-08-08 DIAGNOSIS — H903 Sensorineural hearing loss, bilateral: Secondary | ICD-10-CM | POA: Diagnosis not present

## 2016-08-29 DIAGNOSIS — F418 Other specified anxiety disorders: Secondary | ICD-10-CM | POA: Diagnosis not present

## 2016-08-29 DIAGNOSIS — E559 Vitamin D deficiency, unspecified: Secondary | ICD-10-CM | POA: Diagnosis not present

## 2016-08-29 DIAGNOSIS — I517 Cardiomegaly: Secondary | ICD-10-CM | POA: Diagnosis not present

## 2016-08-29 DIAGNOSIS — M1990 Unspecified osteoarthritis, unspecified site: Secondary | ICD-10-CM | POA: Diagnosis not present

## 2016-09-05 DIAGNOSIS — M810 Age-related osteoporosis without current pathological fracture: Secondary | ICD-10-CM | POA: Diagnosis not present

## 2016-09-05 DIAGNOSIS — R42 Dizziness and giddiness: Secondary | ICD-10-CM | POA: Diagnosis not present

## 2016-09-05 DIAGNOSIS — Z0000 Encounter for general adult medical examination without abnormal findings: Secondary | ICD-10-CM | POA: Diagnosis not present

## 2016-09-05 DIAGNOSIS — I700 Atherosclerosis of aorta: Secondary | ICD-10-CM | POA: Diagnosis not present

## 2016-09-05 DIAGNOSIS — I2510 Atherosclerotic heart disease of native coronary artery without angina pectoris: Secondary | ICD-10-CM | POA: Diagnosis not present

## 2016-09-05 DIAGNOSIS — H9193 Unspecified hearing loss, bilateral: Secondary | ICD-10-CM | POA: Diagnosis not present

## 2016-09-05 DIAGNOSIS — Z1389 Encounter for screening for other disorder: Secondary | ICD-10-CM | POA: Diagnosis not present

## 2016-09-05 DIAGNOSIS — I440 Atrioventricular block, first degree: Secondary | ICD-10-CM | POA: Diagnosis not present

## 2016-09-05 DIAGNOSIS — F418 Other specified anxiety disorders: Secondary | ICD-10-CM | POA: Diagnosis not present

## 2016-09-05 DIAGNOSIS — M2550 Pain in unspecified joint: Secondary | ICD-10-CM | POA: Diagnosis not present

## 2016-09-05 DIAGNOSIS — J439 Emphysema, unspecified: Secondary | ICD-10-CM | POA: Diagnosis not present

## 2016-09-05 DIAGNOSIS — Z681 Body mass index (BMI) 19 or less, adult: Secondary | ICD-10-CM | POA: Diagnosis not present

## 2016-09-06 ENCOUNTER — Other Ambulatory Visit: Payer: Self-pay | Admitting: Internal Medicine

## 2016-09-06 DIAGNOSIS — M546 Pain in thoracic spine: Secondary | ICD-10-CM

## 2016-09-10 ENCOUNTER — Other Ambulatory Visit: Payer: Self-pay | Admitting: Internal Medicine

## 2016-09-10 DIAGNOSIS — M546 Pain in thoracic spine: Secondary | ICD-10-CM

## 2016-09-10 DIAGNOSIS — M545 Low back pain, unspecified: Secondary | ICD-10-CM

## 2016-09-20 ENCOUNTER — Other Ambulatory Visit: Payer: Medicare Other

## 2016-10-01 ENCOUNTER — Other Ambulatory Visit: Payer: Medicare Other

## 2016-10-07 ENCOUNTER — Ambulatory Visit
Admission: RE | Admit: 2016-10-07 | Discharge: 2016-10-07 | Disposition: A | Discharge status: Home / Self Care | Payer: Medicare Other | Source: Ambulatory Visit | Attending: Internal Medicine | Admitting: Internal Medicine

## 2016-10-07 ENCOUNTER — Other Ambulatory Visit: Payer: Medicare Other

## 2016-10-07 DIAGNOSIS — M546 Pain in thoracic spine: Secondary | ICD-10-CM

## 2016-10-07 DIAGNOSIS — M48061 Spinal stenosis, lumbar region without neurogenic claudication: Secondary | ICD-10-CM | POA: Diagnosis not present

## 2016-10-07 DIAGNOSIS — M5124 Other intervertebral disc displacement, thoracic region: Secondary | ICD-10-CM | POA: Diagnosis not present

## 2016-10-07 DIAGNOSIS — M545 Low back pain, unspecified: Secondary | ICD-10-CM

## 2016-10-29 ENCOUNTER — Ambulatory Visit (INDEPENDENT_AMBULATORY_CARE_PROVIDER_SITE_OTHER): Payer: Medicare Other | Admitting: Psychology

## 2016-10-29 DIAGNOSIS — F4323 Adjustment disorder with mixed anxiety and depressed mood: Secondary | ICD-10-CM

## 2016-10-30 ENCOUNTER — Ambulatory Visit: Payer: Medicare Other | Admitting: Psychology

## 2016-11-12 DIAGNOSIS — C44311 Basal cell carcinoma of skin of nose: Secondary | ICD-10-CM | POA: Diagnosis not present

## 2016-11-12 DIAGNOSIS — L820 Inflamed seborrheic keratosis: Secondary | ICD-10-CM | POA: Diagnosis not present

## 2016-11-12 DIAGNOSIS — D485 Neoplasm of uncertain behavior of skin: Secondary | ICD-10-CM | POA: Diagnosis not present

## 2016-11-12 DIAGNOSIS — L814 Other melanin hyperpigmentation: Secondary | ICD-10-CM | POA: Diagnosis not present

## 2016-11-22 ENCOUNTER — Ambulatory Visit: Payer: Medicare Other | Admitting: Psychology

## 2016-12-11 ENCOUNTER — Ambulatory Visit (INDEPENDENT_AMBULATORY_CARE_PROVIDER_SITE_OTHER): Payer: Medicare Other | Admitting: Psychology

## 2016-12-11 DIAGNOSIS — F4323 Adjustment disorder with mixed anxiety and depressed mood: Secondary | ICD-10-CM | POA: Diagnosis not present

## 2016-12-19 DIAGNOSIS — C44311 Basal cell carcinoma of skin of nose: Secondary | ICD-10-CM | POA: Diagnosis not present

## 2016-12-19 DIAGNOSIS — Z85828 Personal history of other malignant neoplasm of skin: Secondary | ICD-10-CM | POA: Diagnosis not present

## 2017-02-12 DIAGNOSIS — Z9841 Cataract extraction status, right eye: Secondary | ICD-10-CM | POA: Diagnosis not present

## 2017-02-12 DIAGNOSIS — H0288A Meibomian gland dysfunction right eye, upper and lower eyelids: Secondary | ICD-10-CM | POA: Diagnosis not present

## 2017-02-12 DIAGNOSIS — Z961 Presence of intraocular lens: Secondary | ICD-10-CM | POA: Diagnosis not present

## 2017-02-12 DIAGNOSIS — Z9842 Cataract extraction status, left eye: Secondary | ICD-10-CM | POA: Diagnosis not present

## 2017-02-12 DIAGNOSIS — H3530 Unspecified macular degeneration: Secondary | ICD-10-CM | POA: Diagnosis not present

## 2017-02-12 DIAGNOSIS — H0288B Meibomian gland dysfunction left eye, upper and lower eyelids: Secondary | ICD-10-CM | POA: Diagnosis not present

## 2017-02-21 DIAGNOSIS — Z681 Body mass index (BMI) 19 or less, adult: Secondary | ICD-10-CM | POA: Diagnosis not present

## 2017-02-21 DIAGNOSIS — M546 Pain in thoracic spine: Secondary | ICD-10-CM | POA: Diagnosis not present

## 2017-03-31 ENCOUNTER — Telehealth: Payer: Self-pay | Admitting: Cardiology

## 2017-03-31 NOTE — Telephone Encounter (Signed)
Ok with plan as outlined Kirk Ruths

## 2017-03-31 NOTE — Telephone Encounter (Signed)
New message     Patient calling with concerns of rapid heartbeat at night. Has been happening for about 10 days. Please call   Patient c/o Palpitations:  High priority if patient c/o lightheadedness, shortness of breath, or chest pain  1) How long have you had palpitations/irregular HR/ Afib? Are you having the symptoms now? NO, 10 days  2) Are you currently experiencing lightheadedness, SOB or CP? NO  3) Do you have a history of afib (atrial fibrillation) or irregular heart rhythm? YES  4) Have you checked your BP or HR? (document readings if available): HR 52 on 1/21  5) Are you experiencing any other symptoms? Not sleeping  well

## 2017-03-31 NOTE — Telephone Encounter (Signed)
Spoke with patient and she wakes up with a "buzzing" sound and feels like her heart is racing at times. She has not been sleeping well at night and has read this could be related to her heart. She has not been seen in a while and wanted appointment. Patient scheduled to see Lurena Joiner on a day Dr Stanford Breed in the office. Will forward to Dr Stanford Breed for review

## 2017-04-10 DIAGNOSIS — D692 Other nonthrombocytopenic purpura: Secondary | ICD-10-CM | POA: Diagnosis not present

## 2017-04-10 DIAGNOSIS — L84 Corns and callosities: Secondary | ICD-10-CM | POA: Diagnosis not present

## 2017-04-10 DIAGNOSIS — L821 Other seborrheic keratosis: Secondary | ICD-10-CM | POA: Diagnosis not present

## 2017-04-10 DIAGNOSIS — D225 Melanocytic nevi of trunk: Secondary | ICD-10-CM | POA: Diagnosis not present

## 2017-04-10 DIAGNOSIS — L918 Other hypertrophic disorders of the skin: Secondary | ICD-10-CM | POA: Diagnosis not present

## 2017-04-10 DIAGNOSIS — D2272 Melanocytic nevi of left lower limb, including hip: Secondary | ICD-10-CM | POA: Diagnosis not present

## 2017-04-10 DIAGNOSIS — Z85828 Personal history of other malignant neoplasm of skin: Secondary | ICD-10-CM | POA: Diagnosis not present

## 2017-04-10 DIAGNOSIS — L720 Epidermal cyst: Secondary | ICD-10-CM | POA: Diagnosis not present

## 2017-04-10 DIAGNOSIS — D1801 Hemangioma of skin and subcutaneous tissue: Secondary | ICD-10-CM | POA: Diagnosis not present

## 2017-04-10 DIAGNOSIS — L853 Xerosis cutis: Secondary | ICD-10-CM | POA: Diagnosis not present

## 2017-04-16 ENCOUNTER — Ambulatory Visit: Payer: Self-pay | Admitting: Cardiology

## 2017-04-17 ENCOUNTER — Ambulatory Visit (INDEPENDENT_AMBULATORY_CARE_PROVIDER_SITE_OTHER): Payer: Medicare Other | Admitting: Cardiology

## 2017-04-17 ENCOUNTER — Encounter: Payer: Self-pay | Admitting: Cardiology

## 2017-04-17 VITALS — BP 136/84 | HR 61 | Ht 65.0 in | Wt 112.6 lb

## 2017-04-17 DIAGNOSIS — I059 Rheumatic mitral valve disease, unspecified: Secondary | ICD-10-CM | POA: Diagnosis not present

## 2017-04-17 DIAGNOSIS — I773 Arterial fibromuscular dysplasia: Secondary | ICD-10-CM

## 2017-04-17 DIAGNOSIS — R002 Palpitations: Secondary | ICD-10-CM | POA: Diagnosis not present

## 2017-04-17 NOTE — Assessment & Plan Note (Signed)
History of MVP in the past

## 2017-04-17 NOTE — Progress Notes (Signed)
04/17/2017 Ashley Washington   13-Feb-1933  [id number]  Primary Physician Crist Infante, MD Primary Cardiologist: Dr Stanford Breed  HPI:  Pleasant 84 y/o female followed by Dr Stanford Breed and Dr Joylene Draft with a history of MVP, palpitations, and FMD of her Rt RA. She had a low risk Myoview in 2010. Echo in March 2017 showed normal LVF with grade 1 DD, mild LAE, and moderate pulmonary HTN with a PA pressure of 51 mmHg. Her LOV was Dec 2016. She is seen today because she has noted occasional palpitations in bed at night as well as "buzzing" sensation in her chest. She denies any sustained tachycardia. She walks on a treadmill or in the neighborhood depending on the weather. She recently purchased an Apple watch and is following her HR.    Current Outpatient Medications  Medication Sig Dispense Refill  . atenolol (TENORMIN) 25 MG tablet 1/2 tab po qd 90 tablet 4  . Bioflavonoid Products (ESTER-C) 500-200-60 MG TABS Take 1 tablet by mouth daily.    . Cholecalciferol (VITAMIN D3) 1000 UNITS CAPS Take by mouth.    . COD LIVER OIL PO Take 1 Dose by mouth daily.    . Coenzyme Q10 (CO Q 10 PO) Take 50 mg by mouth daily.     Marland Kitchen LORazepam (ATIVAN) 0.5 MG tablet 1/4 of tablet as needed 30 tablet   . meclizine (ANTIVERT) 25 MG tablet Take 1 tablet by mouth daily as needed.    . metroNIDAZOLE (METROGEL) 0.75 % gel Apply 1 application topically 2 (two) times daily.    . MULTIPLE VITAMIN PO Take by mouth.    . Multiple Vitamins-Minerals (ICAPS LUTEIN & ZEAXANTHIN PO) Take by mouth.    . QUERCETIN PO Take by mouth.    . TURMERIC PO Take by mouth.    . vitamin E 200 UNIT capsule Take 200 Units by mouth daily.     No current facility-administered medications for this visit.     Allergies  Allergen Reactions  . Doxycycline Other (See Comments)    Eye irritation  . Fluorometholone Other (See Comments)    Eye irritation due to preservatives  . Other     Antibiotics - causes GI upset  . Pilocarpine Other (See  Comments)    Caused blood pressure to drop so low they almost had to call EMS    Past Medical History:  Diagnosis Date  . Cancer ([place])   . Fibromuscular dysplasia of renal artery ([place])   . Hypertension   . Mitral valve prolapse   . Thyroid disease     Social History   Socioeconomic History  . Marital status: Married    Spouse name: Not on file  . Number of children: Not on file  . Years of education: Not on file  . Highest education level: Not on file  Social Needs  . Financial resource strain: Not on file  . Food insecurity - worry: Not on file  . Food insecurity - inability: Not on file  . Transportation needs - medical: Not on file  . Transportation needs - non-medical: Not on file  Occupational History  . Not on file  Tobacco Use  . Smoking status: Never Smoker  . Smokeless tobacco: Never Used  Substance and Sexual Activity  . Alcohol use: Not on file  . Drug use: Not on file  . Sexual activity: Not on file  Other Topics Concern  . Not on file  Social History Narrative  . Not on  file     Family History  Problem Relation Age of Onset  . Breast cancer Paternal Aunt   . Breast cancer Paternal Aunt   . Colon cancer Neg Hx      Review of Systems: General: negative for chills, fever, night sweats or weight changes.  Cardiovascular: negative for chest pain, dyspnea on exertion, edema, orthopnea, palpitations, paroxysmal nocturnal dyspnea or shortness of breath Dermatological: negative for rash Respiratory: negative for cough or wheezing Urologic: negative for hematuria Abdominal: negative for nausea, vomiting, diarrhea, bright red blood per rectum, melena, or hematemesis Neurologic: negative for visual changes, syncope, or dizziness All other systems reviewed and are otherwise negative except as noted above.    Blood pressure 136/84, pulse 61, height 5\' 5"  (1.651 m), weight 112 lb 9.6 oz (51.1 kg).  General appearance: alert, cooperative, no distress and  thin Neck: no carotid bruit and no JVD Lungs: clear to auscultation bilaterally Heart: regular rate and rhythm Extremities: no edema Skin: Skin color, texture, turgor normal. No rashes or lesions Neurologic: Grossly normal  EKG [id number]  ASSESSMENT AND PLAN:   Palpitations I suspect this is an occasional ectopic beat, not sustained tachycardia  Mitral valve disorder History of MVP in the past  Renal fibromuscular dysplasia 1-59% Rt RAS by RA doppler Aug 2012   PLAN  I suggested she try taking her Atenolol 12.5 mg QHS. She was concerned about this because she read that beta blockers cause trouble sleeping. I reassured her I did not think that was a signifcant problem and that she could at least try it. If she feels it's affecting her sleep she can always go back to Q AM. If she has sustained tachycardia she will call back. F/U one year.   Kerin Ransom PA-C 04/17/2017 10:50 AM

## 2017-04-17 NOTE — Patient Instructions (Signed)
Kerin Ransom, [place] suggests taking atenolol before bed.   Your physician wants you to follow-up in: ONE YEAR with Dr. Stanford Breed. You will receive a reminder letter in the mail two months in advance. If you don't receive a letter, please call our office to schedule the follow-up appointment.

## 2017-04-17 NOTE — Assessment & Plan Note (Signed)
I suspect this is an occasional ectopic beat, not sustained tachycardia

## 2017-04-17 NOTE — Assessment & Plan Note (Signed)
1-59% Rt RAS by RA doppler Aug 2012 

## 2017-07-09 DIAGNOSIS — R5383 Other fatigue: Secondary | ICD-10-CM | POA: Diagnosis not present

## 2017-07-09 DIAGNOSIS — D72818 Other decreased white blood cell count: Secondary | ICD-10-CM | POA: Diagnosis not present

## 2017-07-09 DIAGNOSIS — E538 Deficiency of other specified B group vitamins: Secondary | ICD-10-CM | POA: Diagnosis not present

## 2017-08-15 DIAGNOSIS — N83202 Unspecified ovarian cyst, left side: Secondary | ICD-10-CM | POA: Diagnosis not present

## 2017-08-28 DIAGNOSIS — Z681 Body mass index (BMI) 19 or less, adult: Secondary | ICD-10-CM | POA: Diagnosis not present

## 2017-08-28 DIAGNOSIS — R5383 Other fatigue: Secondary | ICD-10-CM | POA: Diagnosis not present

## 2017-08-28 DIAGNOSIS — R0789 Other chest pain: Secondary | ICD-10-CM | POA: Diagnosis not present

## 2017-08-28 DIAGNOSIS — K449 Diaphragmatic hernia without obstruction or gangrene: Secondary | ICD-10-CM | POA: Diagnosis not present

## 2017-09-18 ENCOUNTER — Other Ambulatory Visit: Payer: Self-pay | Admitting: Gastroenterology

## 2017-09-18 DIAGNOSIS — R143 Flatulence: Secondary | ICD-10-CM | POA: Diagnosis not present

## 2017-09-18 DIAGNOSIS — R1310 Dysphagia, unspecified: Secondary | ICD-10-CM

## 2017-09-18 DIAGNOSIS — Z86010 Personal history of colonic polyps: Secondary | ICD-10-CM | POA: Diagnosis not present

## 2017-09-18 DIAGNOSIS — R0789 Other chest pain: Secondary | ICD-10-CM | POA: Diagnosis not present

## 2017-09-23 ENCOUNTER — Ambulatory Visit
Admission: RE | Admit: 2017-09-23 | Discharge: 2017-09-23 | Disposition: A | Discharge status: Home / Self Care | Payer: Medicare Other | Source: Ambulatory Visit | Attending: Gastroenterology | Admitting: Gastroenterology

## 2017-09-23 DIAGNOSIS — R1310 Dysphagia, unspecified: Secondary | ICD-10-CM | POA: Diagnosis not present

## 2017-10-16 DIAGNOSIS — R82998 Other abnormal findings in urine: Secondary | ICD-10-CM | POA: Diagnosis not present

## 2017-10-16 DIAGNOSIS — E559 Vitamin D deficiency, unspecified: Secondary | ICD-10-CM | POA: Diagnosis not present

## 2017-10-16 DIAGNOSIS — Z79899 Other long term (current) drug therapy: Secondary | ICD-10-CM | POA: Diagnosis not present

## 2017-10-22 DIAGNOSIS — Z0000 Encounter for general adult medical examination without abnormal findings: Secondary | ICD-10-CM | POA: Diagnosis not present

## 2017-10-22 DIAGNOSIS — Z23 Encounter for immunization: Secondary | ICD-10-CM | POA: Diagnosis not present

## 2017-10-22 DIAGNOSIS — Z681 Body mass index (BMI) 19 or less, adult: Secondary | ICD-10-CM | POA: Diagnosis not present

## 2017-10-22 DIAGNOSIS — D126 Benign neoplasm of colon, unspecified: Secondary | ICD-10-CM | POA: Diagnosis not present

## 2017-10-22 DIAGNOSIS — I2510 Atherosclerotic heart disease of native coronary artery without angina pectoris: Secondary | ICD-10-CM | POA: Diagnosis not present

## 2017-10-22 DIAGNOSIS — J439 Emphysema, unspecified: Secondary | ICD-10-CM | POA: Diagnosis not present

## 2017-10-22 DIAGNOSIS — M810 Age-related osteoporosis without current pathological fracture: Secondary | ICD-10-CM | POA: Diagnosis not present

## 2017-10-22 DIAGNOSIS — M2550 Pain in unspecified joint: Secondary | ICD-10-CM | POA: Diagnosis not present

## 2017-10-22 DIAGNOSIS — H9193 Unspecified hearing loss, bilateral: Secondary | ICD-10-CM | POA: Diagnosis not present

## 2017-10-22 DIAGNOSIS — R5383 Other fatigue: Secondary | ICD-10-CM | POA: Diagnosis not present

## 2017-10-22 DIAGNOSIS — M546 Pain in thoracic spine: Secondary | ICD-10-CM | POA: Diagnosis not present

## 2017-10-22 DIAGNOSIS — Z1389 Encounter for screening for other disorder: Secondary | ICD-10-CM | POA: Diagnosis not present

## 2017-10-22 DIAGNOSIS — K449 Diaphragmatic hernia without obstruction or gangrene: Secondary | ICD-10-CM | POA: Diagnosis not present

## 2017-10-31 DIAGNOSIS — H0288B Meibomian gland dysfunction left eye, upper and lower eyelids: Secondary | ICD-10-CM | POA: Diagnosis not present

## 2017-10-31 DIAGNOSIS — H3530 Unspecified macular degeneration: Secondary | ICD-10-CM | POA: Diagnosis not present

## 2017-10-31 DIAGNOSIS — H0288A Meibomian gland dysfunction right eye, upper and lower eyelids: Secondary | ICD-10-CM | POA: Diagnosis not present

## 2017-10-31 DIAGNOSIS — Z961 Presence of intraocular lens: Secondary | ICD-10-CM | POA: Diagnosis not present

## 2017-10-31 DIAGNOSIS — Z9842 Cataract extraction status, left eye: Secondary | ICD-10-CM | POA: Diagnosis not present

## 2017-10-31 DIAGNOSIS — L718 Other rosacea: Secondary | ICD-10-CM | POA: Diagnosis not present

## 2017-10-31 DIAGNOSIS — Z9841 Cataract extraction status, right eye: Secondary | ICD-10-CM | POA: Diagnosis not present

## 2017-11-16 IMAGING — MR MR LUMBAR SPINE W/O CM
4 of 5 series · 25 of 48 positions shown · non-contrast
Comparison: 04/09/2013

CLINICAL DATA: Chronic thoracic and lumbar back pain worsening
recently.

EXAM:
MRI LUMBAR SPINE WITHOUT CONTRAST
TECHNIQUE: Multiplanar, multisequence MR imaging of the lumbar spine was
performed. No intravenous contrast was administered.

[Series 4: T1 · sagittal · 4.0mm · 0.55mm/px · 5 of 13 slices shown (1 of 2)]
[im 1/13]
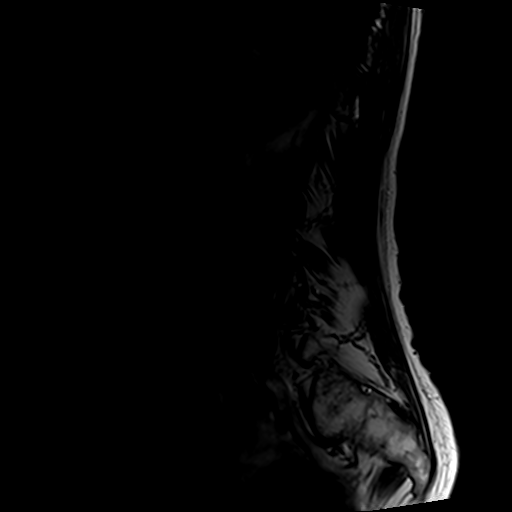
[im 4/13]
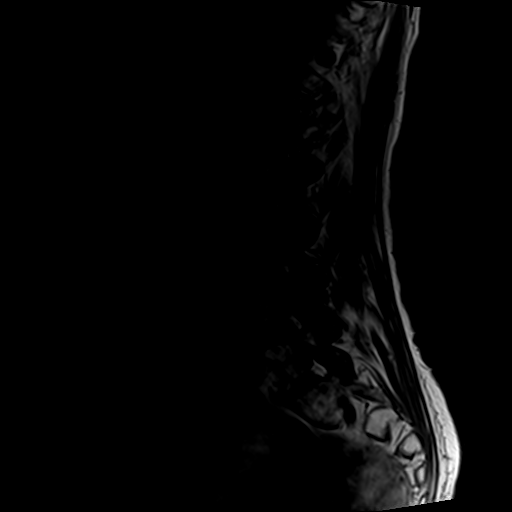
[im 7/13]
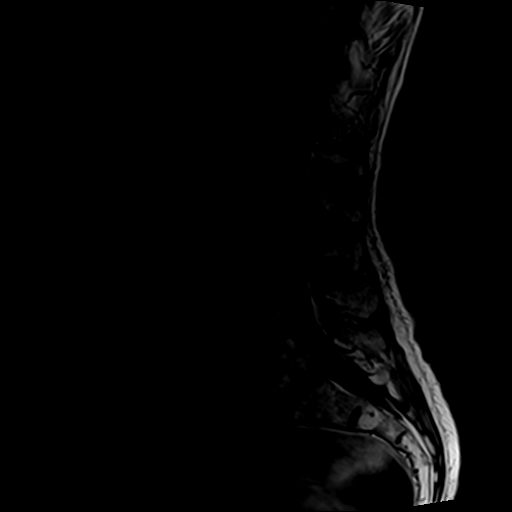
[im 10/13]
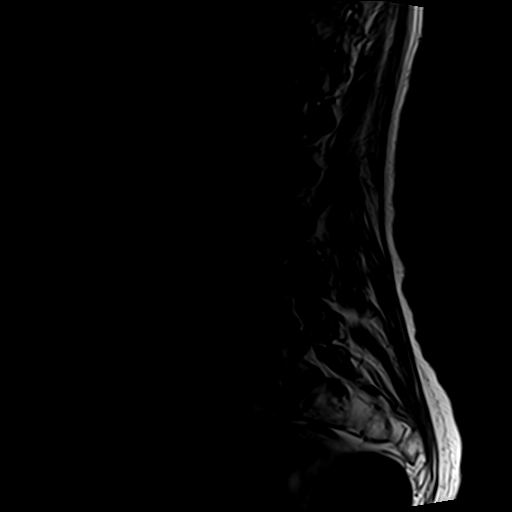
[im 13/13]
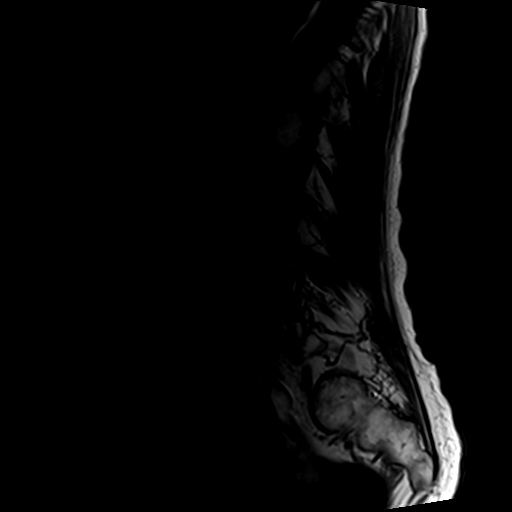

[Series 5: T2 post-contrast · sagittal · 4.0mm · 0.55mm/px · 5 of 13 slices shown]
[im 1/13]
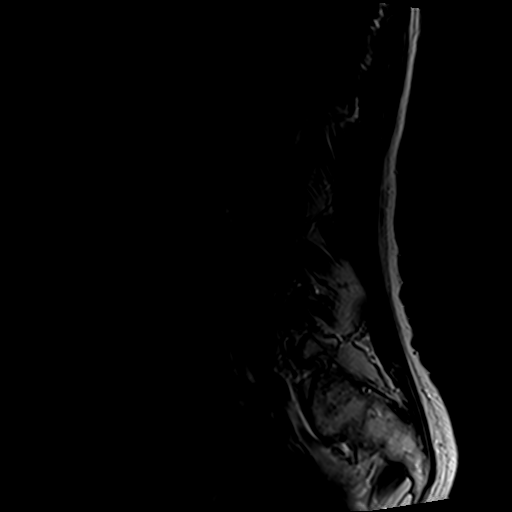
[im 4/13]
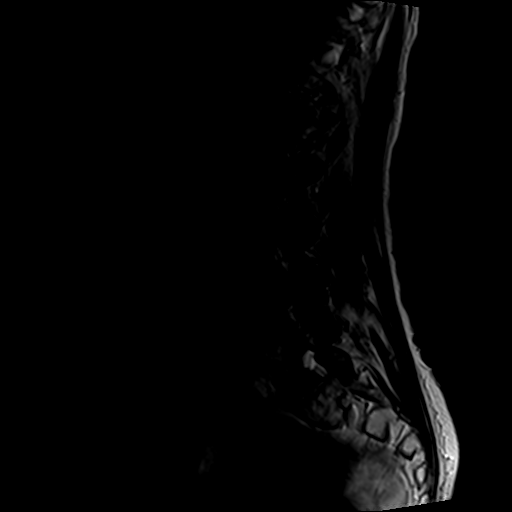
[im 7/13]
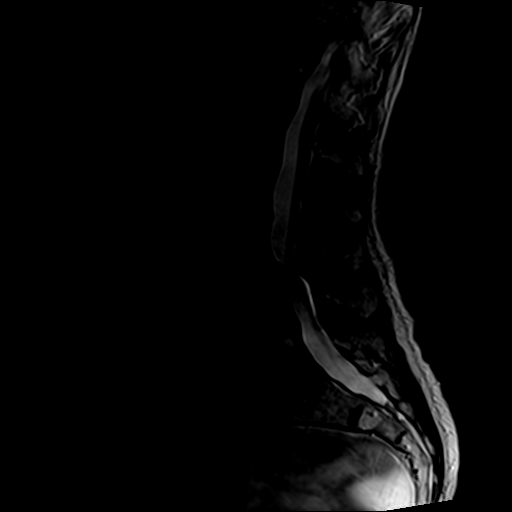
[im 10/13]
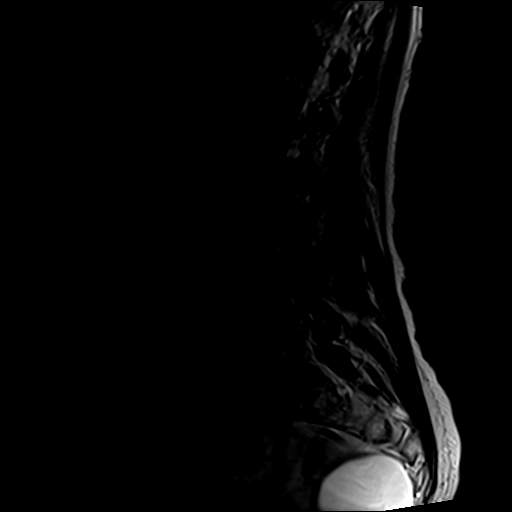
[im 13/13]
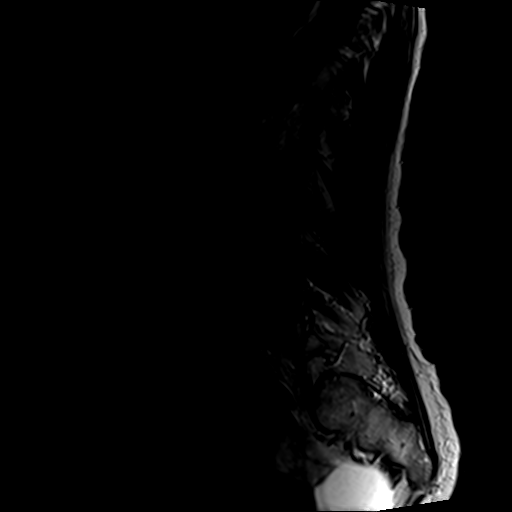

[Series 6: T2 · axial · 4.0mm · 0.70mm/px · z∈[-18,+187]mm · 10 of 40 slices shown]
[im 3/40]
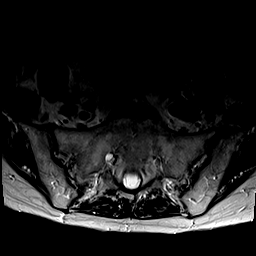
[im 6/40]
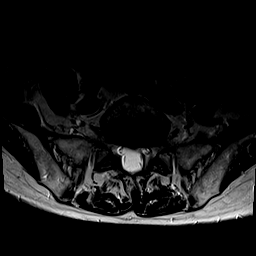
[im 8/40]
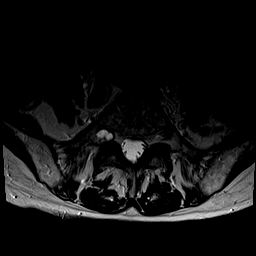
[im 14/40]
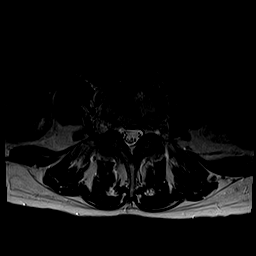
[im 19/40]
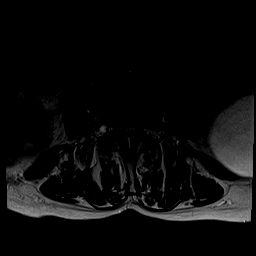
[im 21/40]
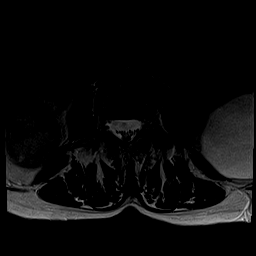
[im 24/40]
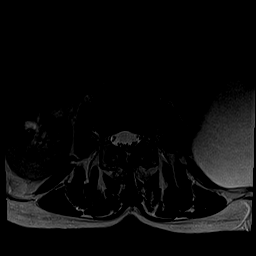
[im 29/40]
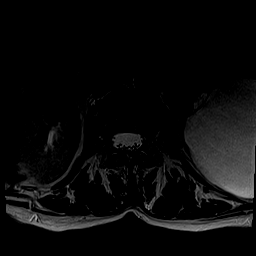
[im 34/40]
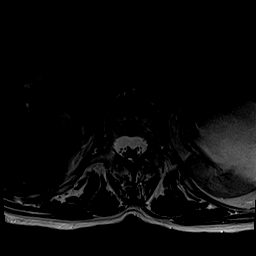
[im 40/40]
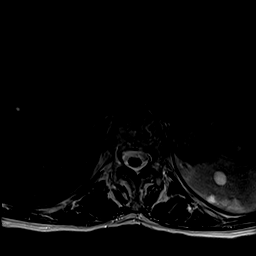

[Series 7: T1 · axial · 4.0mm · 0.35mm/px · z∈[-18,+157]mm · 5 of 40 slices shown (2 of 2)]
[im 3/40]
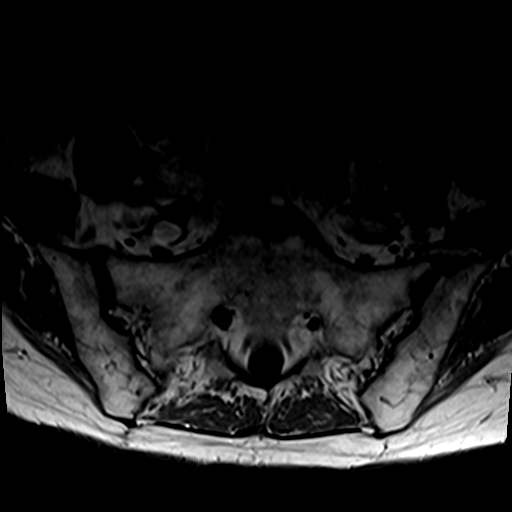
[im 6/40]
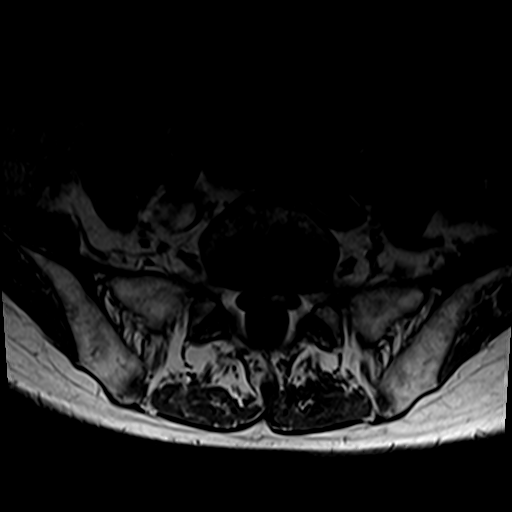
[im 8/40]
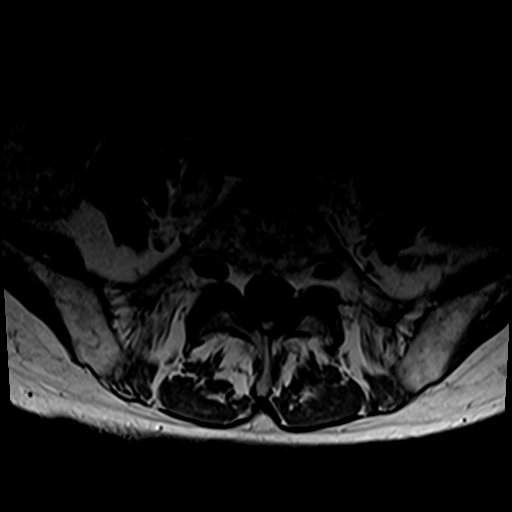
[im 21/40]
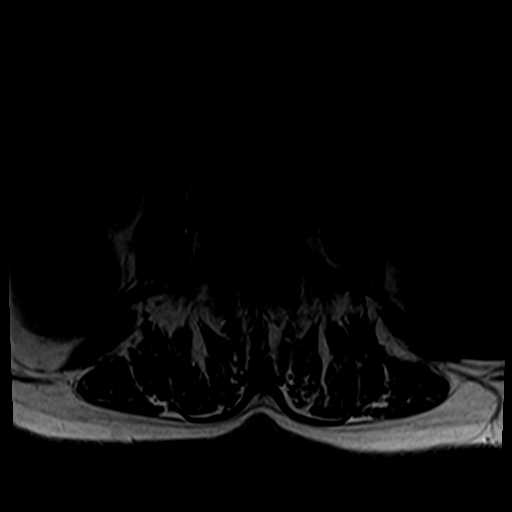
[im 34/40]
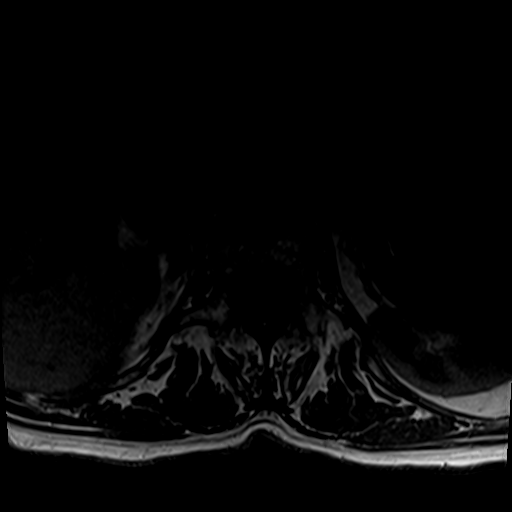

[25 of 48 positions shown; findings below may reference images not displayed]

FINDINGS: Segmentation:  5 lumbar type vertebral bodies.

Alignment: Curvature convex to the right with the apex at L3.
Degenerative anterolisthesis at L3-4 of 5-6 mm.

Vertebrae:  No fracture or primary bone lesion.

Conus medullaris: Extends to the L1-2 level and appears normal.

Paraspinal and other soft tissues: Benign appearing large left renal
cyst. Left pelvic cyst as seen previously, not completely evaluated.

Disc levels:

T12-L1 L1-2: Unremarkable.

L2-3: Degenerated or congenitally small. Small left-sided
osteophytes. No compressive stenosis.

L3-4: Facet arthropathy with anterolisthesis of 5-6 mm. Bulging of
the disc. Multifactorial spinal stenosis that could cause neural
compression on either or both sides. This appearance could worsen
with standing or flexion. Small cyst in the lateral foramen on the
right could be a root sleeve cyst or a small synovial cyst arising
from the facet.

L4-5: Mild bulging of the disc. Bilateral facet osteoarthritis. Mild
stenosis of both lateral recesses and neural foramina.

L5-S1: Minimal bulging of the disc. Mild facet osteoarthritis. No
stenosis.
IMPRESSION: No significant change at L2-3 or above.

L3-4: Bilateral facet arthropathy with 5 6 mm of anterolisthesis
appears slightly worsened when compared to the study of 4005.
Stenosis of the lateral recesses and foramina that could cause
neural compression. Small cyst in the lateral foramen on the right
could be root sleeve cyst or a small synovial cyst from the facet.

L4-5: Disc bulge and facet hypertrophy with mild stenosis of the
lateral recesses and foramina. I think this is slightly worsened as
well.

Left pelvic cyst as seen previously, not primarily or completely
evaluated. This is not a normal finding in a person of this age and
if this has not been evaluated, pelvic ultrasound would be
recommended.

## 2017-11-16 IMAGING — MR MR THORACIC SPINE W/O CM
4 of 5 series · 18 of 48 positions shown · non-contrast
Comparison: CT chest 01/19/2015.  MRI 08/10/2005.

CLINICAL DATA: Chronic thoracic back pain which is worsening.

EXAM:
MRI THORACIC SPINE WITHOUT CONTRAST
TECHNIQUE: Multiplanar, multisequence MR imaging of the thoracic spine was
performed. No intravenous contrast was administered.

[Series 4: T2 · sagittal · 3.0mm · 0.47mm/px · 6 of 15 slices shown (1 of 2)]
[im 1/15]
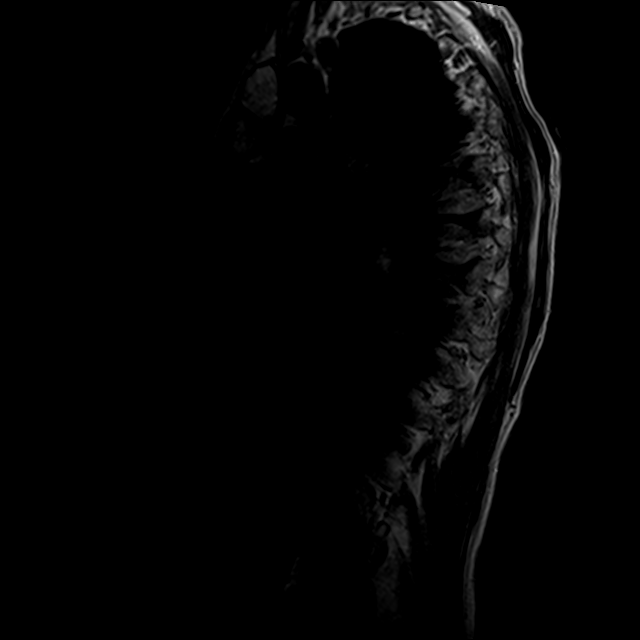
[im 3/15]
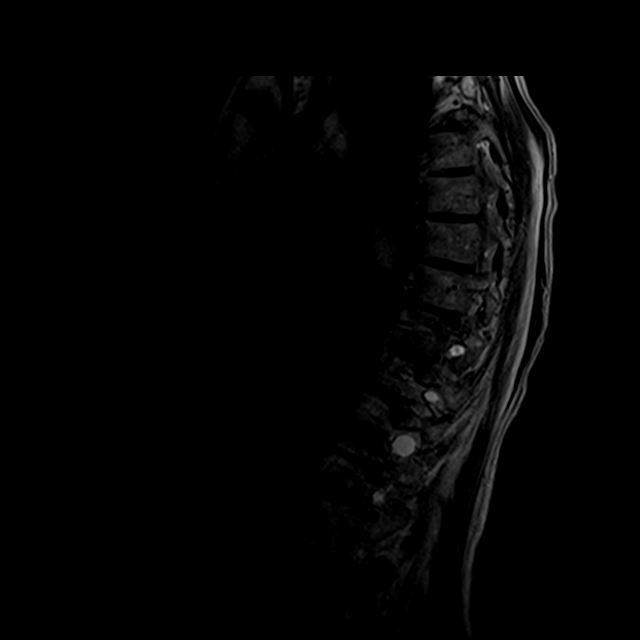
[im 6/15]
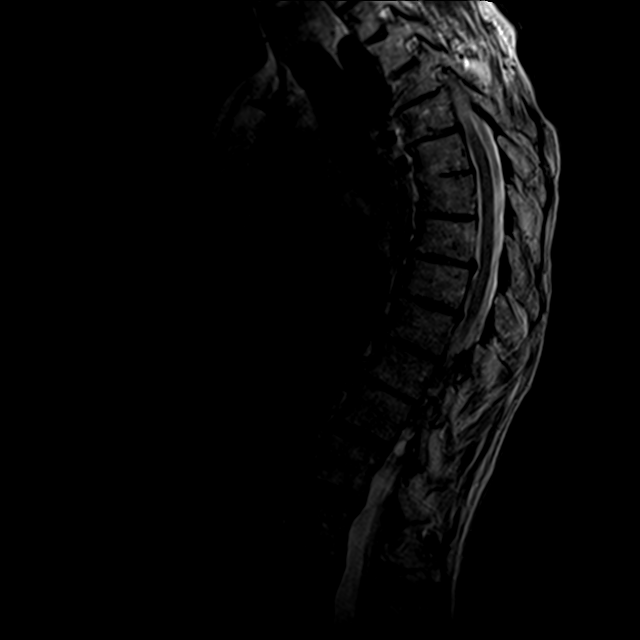
[im 9/15]
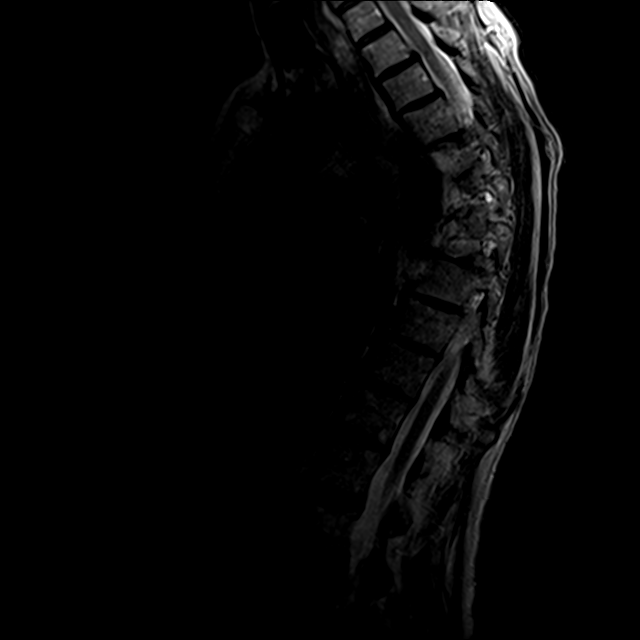
[im 12/15]
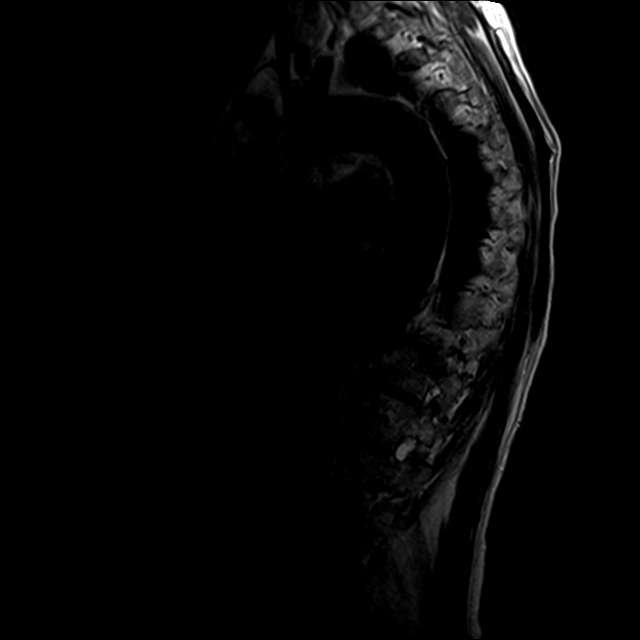
[im 15/15]
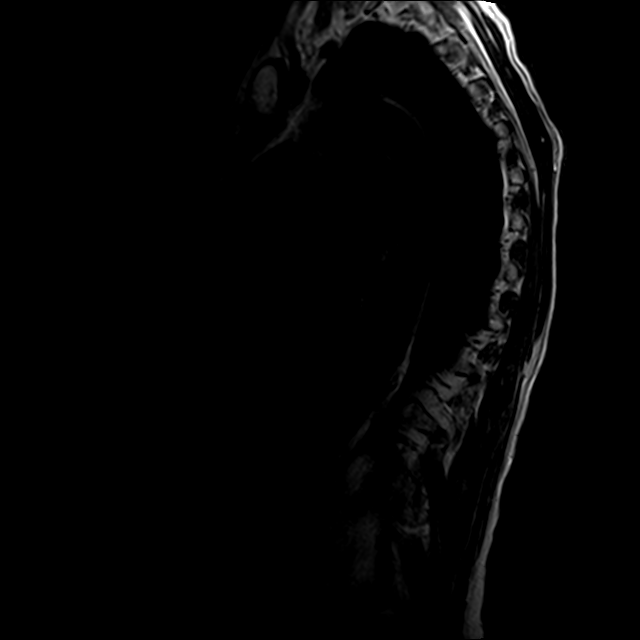

[Series 5: T1 · sagittal · 3.0mm · 0.47mm/px · 3 of 15 slices shown]
[im 3/15]
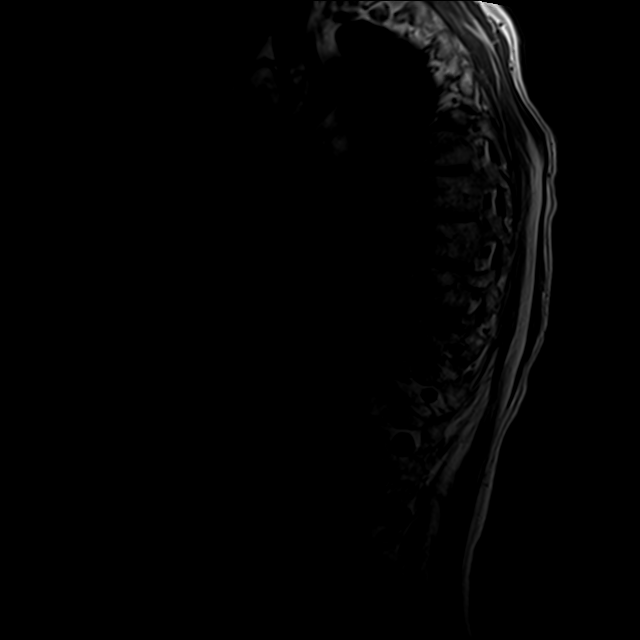
[im 8/15]
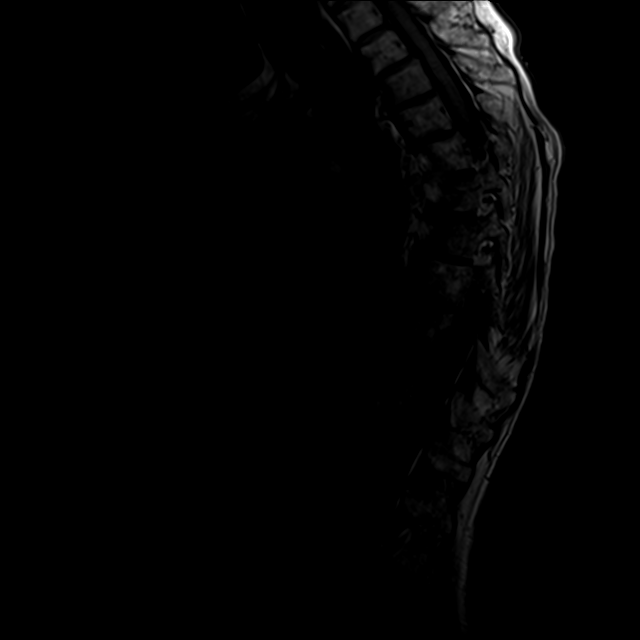
[im 12/15]
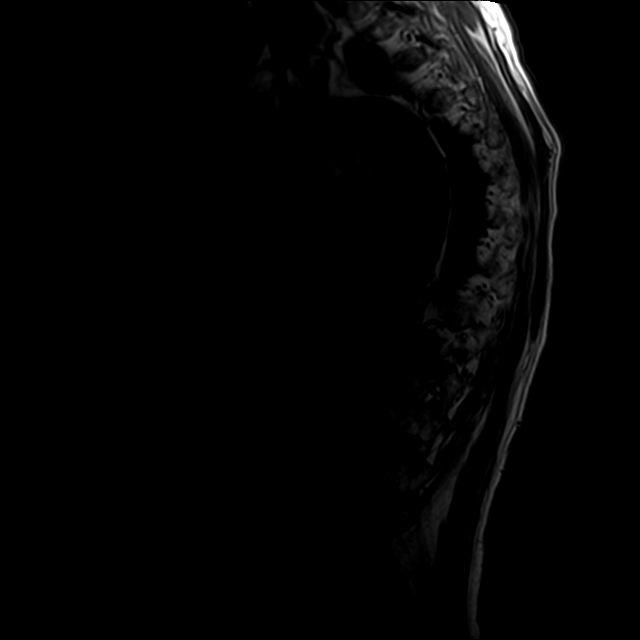

[Series 6: STIR · sagittal · 3.0mm · 0.94mm/px · 3 of 15 slices shown]
[im 3/15]
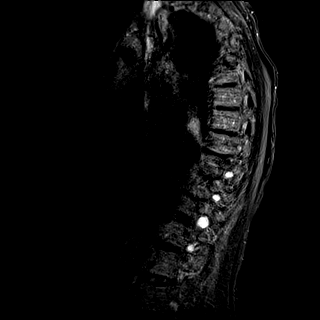
[im 8/15]
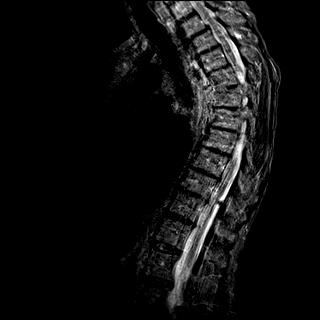
[im 12/15]
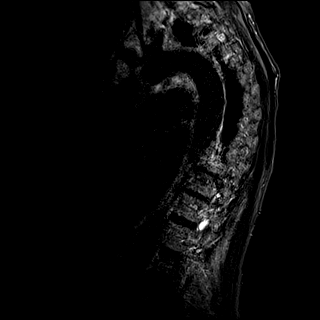

[Series 7: T2 · axial · 3.5mm · 0.39mm/px · z∈[-310,-154]mm · 6 of 31 slices shown (2 of 2)]
[im 1/31]
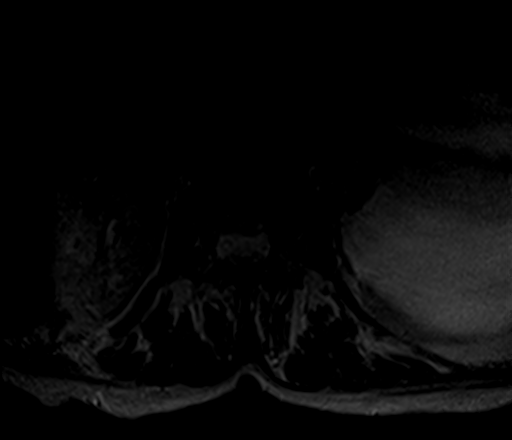
[im 5/31]
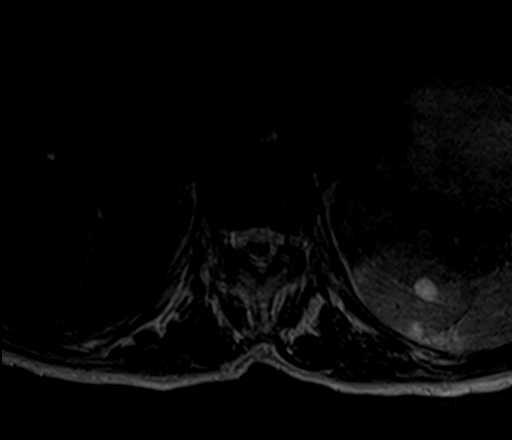
[im 10/31]
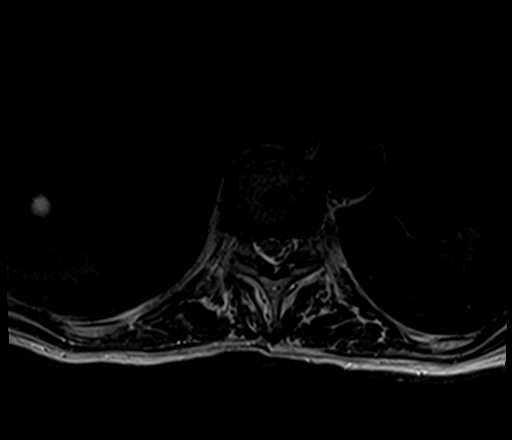
[im 14/31]
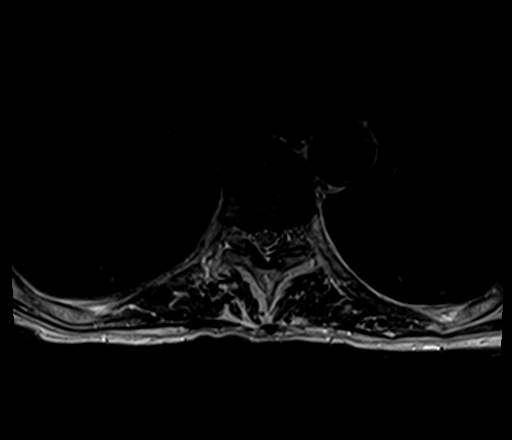
[im 17/31]
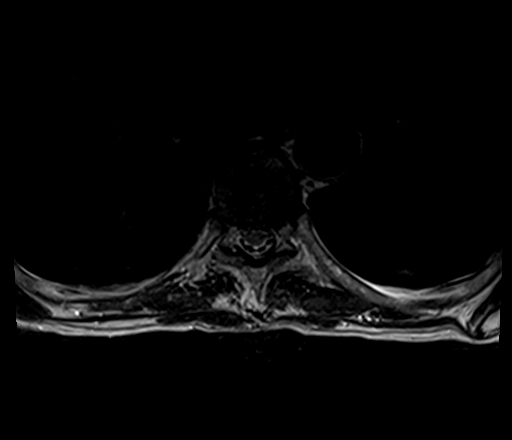
[im 26/31]
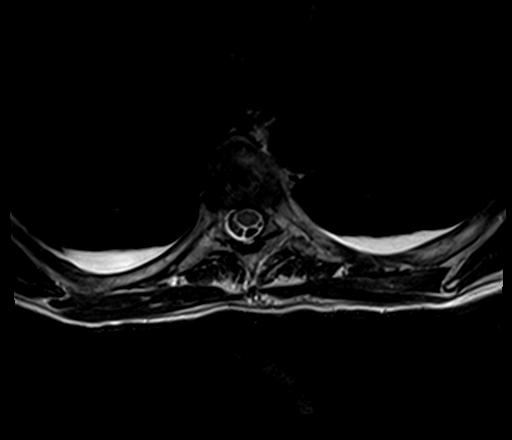

[18 of 48 positions shown; findings below may reference images not displayed]

FINDINGS: Alignment: Curvature convex to the right with the apex at T6.
Increased kyphotic curvature.

Vertebrae: No fracture or active focal bone lesion. The patient has
developed fusion anteriorly at the T5-6 level.

Cord:  No cord compression or primary cord lesion.

Paraspinal and other soft tissues: Small bilateral pleural effusions
layering dependently.

Disc levels:

Ordinary mild age related desiccation and bulging of the discs
throughout the thoracic region. At no level is there a herniation or
any compressive stenosis of the canal or foramina. There is no
evidence of edematous facet arthropathy.
IMPRESSION: Thoracic spinal curvature convex to the right. Chronic kyphotic
curvature.

Degenerative disc disease throughout the thoracic region with disc
bulges but no compressive herniation. Canal and foramina are
sufficiently patent. No evidence of edematous facet arthropathy. The
findings could be associated with chronic back pain.

Small bilateral effusions layering dependently.

## 2017-12-05 DIAGNOSIS — R0789 Other chest pain: Secondary | ICD-10-CM | POA: Diagnosis not present

## 2017-12-05 DIAGNOSIS — R143 Flatulence: Secondary | ICD-10-CM | POA: Diagnosis not present

## 2017-12-05 DIAGNOSIS — R1310 Dysphagia, unspecified: Secondary | ICD-10-CM | POA: Diagnosis not present

## 2018-04-24 DIAGNOSIS — J40 Bronchitis, not specified as acute or chronic: Secondary | ICD-10-CM | POA: Diagnosis not present

## 2018-04-24 DIAGNOSIS — Z681 Body mass index (BMI) 19 or less, adult: Secondary | ICD-10-CM | POA: Diagnosis not present

## 2018-04-24 DIAGNOSIS — R05 Cough: Secondary | ICD-10-CM | POA: Diagnosis not present

## 2018-05-01 DIAGNOSIS — N390 Urinary tract infection, site not specified: Secondary | ICD-10-CM | POA: Diagnosis not present

## 2018-05-01 DIAGNOSIS — R82998 Other abnormal findings in urine: Secondary | ICD-10-CM | POA: Diagnosis not present

## 2018-05-01 DIAGNOSIS — D696 Thrombocytopenia, unspecified: Secondary | ICD-10-CM | POA: Diagnosis not present

## 2018-05-11 DIAGNOSIS — N390 Urinary tract infection, site not specified: Secondary | ICD-10-CM | POA: Diagnosis not present

## 2018-05-11 DIAGNOSIS — Z681 Body mass index (BMI) 19 or less, adult: Secondary | ICD-10-CM | POA: Diagnosis not present

## 2018-05-11 DIAGNOSIS — J40 Bronchitis, not specified as acute or chronic: Secondary | ICD-10-CM | POA: Diagnosis not present

## 2018-05-11 DIAGNOSIS — J439 Emphysema, unspecified: Secondary | ICD-10-CM | POA: Diagnosis not present

## 2018-05-11 DIAGNOSIS — R05 Cough: Secondary | ICD-10-CM | POA: Diagnosis not present

## 2018-05-11 DIAGNOSIS — R300 Dysuria: Secondary | ICD-10-CM | POA: Diagnosis not present

## 2018-05-18 DIAGNOSIS — Z681 Body mass index (BMI) 19 or less, adult: Secondary | ICD-10-CM | POA: Diagnosis not present

## 2018-05-18 DIAGNOSIS — R05 Cough: Secondary | ICD-10-CM | POA: Diagnosis not present

## 2018-05-22 ENCOUNTER — Other Ambulatory Visit: Payer: Self-pay | Admitting: Internal Medicine

## 2018-05-22 DIAGNOSIS — R059 Cough, unspecified: Secondary | ICD-10-CM

## 2018-05-22 DIAGNOSIS — R05 Cough: Secondary | ICD-10-CM

## 2018-06-09 ENCOUNTER — Institutional Professional Consult (permissible substitution): Payer: Self-pay | Admitting: Pulmonary Disease

## 2018-07-01 ENCOUNTER — Telehealth: Payer: Self-pay | Admitting: *Deleted

## 2018-07-01 NOTE — Telephone Encounter (Signed)
06/11/18 06/11/18 LMOM @ 10:41  am, re: follow up appointment.

## 2018-07-07 ENCOUNTER — Other Ambulatory Visit: Payer: Self-pay

## 2018-07-28 ENCOUNTER — Other Ambulatory Visit: Payer: Self-pay

## 2018-07-28 DIAGNOSIS — Z85828 Personal history of other malignant neoplasm of skin: Secondary | ICD-10-CM | POA: Diagnosis not present

## 2018-07-28 DIAGNOSIS — L820 Inflamed seborrheic keratosis: Secondary | ICD-10-CM | POA: Diagnosis not present

## 2018-07-28 DIAGNOSIS — L821 Other seborrheic keratosis: Secondary | ICD-10-CM | POA: Diagnosis not present

## 2018-07-28 DIAGNOSIS — D1801 Hemangioma of skin and subcutaneous tissue: Secondary | ICD-10-CM | POA: Diagnosis not present

## 2018-08-11 ENCOUNTER — Other Ambulatory Visit: Payer: Medicare Other

## 2018-08-12 ENCOUNTER — Institutional Professional Consult (permissible substitution): Payer: Medicare Other | Admitting: Internal Medicine

## 2018-09-23 ENCOUNTER — Telehealth: Payer: Self-pay | Admitting: Cardiology

## 2018-09-23 NOTE — Telephone Encounter (Signed)
Spoke to patient - she states she had some left side pain under her breast last night .  oof and on for @ one hour 9 pm ro 10 pm .  She did not use any NTG .  Patient has a smart watch - stars she had some erratic strips captured. No shrt ness of breathe, little nausea, no radiation of pain . Patient did  Check blood pressure.   Patient states when she awoke at 5 AM little discomfort , but no discomfort now. Blood pressure now 137/76 p 74.  reviewed with  Dr.Croitoru.  Offered several appointments for 7/16 , 7/17 to see an extender.  Patient  Decline an except an  appt with Kerin Ransom Pa on 09/28/18 at 2:30 pm       COVID-19 Pre-Screening Questions:  . In the past 7 to 10 days have you had a cough,  shortness of breath, headache, congestion, fever (100 or greater) body aches, chills, sore throat, or sudden loss of taste or sense of smell?no . Have you been around anyone with known Covid 19.no . Have you been around anyone who is awaiting Covid 19 test results in the past 7 to 10 days?no . Have you been around anyone who has been exposed to Covid 19, or has mentioned symptoms of Covid 19 within the past 7 to 10 days?no  I Patient aware  To wear face covering or mask and no  Other visitor to appointment but patient.

## 2018-09-23 NOTE — Telephone Encounter (Signed)
   Pt c/o of Chest Pain: STAT if CP now or developed within 24 hours  1. Are you having CP right now?no  2. Are you experiencing any other symptoms (ex. SOB, nausea, vomiting, sweating)? no  3. How long have you been experiencing CP? Last night  4. Is your CP continuous or coming and going? Off and on  5. Have you taken Nitroglycerin? no ? Patient states pain started last night when she was getting ready for bed and was off an on for a couple hours

## 2018-09-28 ENCOUNTER — Other Ambulatory Visit: Payer: Self-pay

## 2018-09-28 ENCOUNTER — Ambulatory Visit (INDEPENDENT_AMBULATORY_CARE_PROVIDER_SITE_OTHER): Payer: Medicare Other | Admitting: Cardiology

## 2018-09-28 ENCOUNTER — Telehealth: Payer: Self-pay | Admitting: Cardiology

## 2018-09-28 VITALS — BP 146/76 | HR 89 | Temp 98.7°F | Ht 64.5 in | Wt 107.0 lb

## 2018-09-28 DIAGNOSIS — I773 Arterial fibromuscular dysplasia: Secondary | ICD-10-CM

## 2018-09-28 DIAGNOSIS — I059 Rheumatic mitral valve disease, unspecified: Secondary | ICD-10-CM | POA: Diagnosis not present

## 2018-09-28 DIAGNOSIS — M797 Fibromyalgia: Secondary | ICD-10-CM

## 2018-09-28 DIAGNOSIS — I272 Pulmonary hypertension, unspecified: Secondary | ICD-10-CM | POA: Insufficient documentation

## 2018-09-28 DIAGNOSIS — R079 Chest pain, unspecified: Secondary | ICD-10-CM

## 2018-09-28 NOTE — Assessment & Plan Note (Signed)
Patient sen today with complaints of SSCP that lasted 20-30 minutes

## 2018-09-28 NOTE — Assessment & Plan Note (Signed)
1-59% Rt RAS by RA doppler Aug 2012

## 2018-09-28 NOTE — Telephone Encounter (Signed)
I called pt to confirm her appt for 09-28-18.          COVID-19 Pre-Screening Questions:   In the past 7 to 10 days have you had a cough,  shortness of breath, headache, congestion, fever (100 or greater) body aches, chills, sore throat, or sudden loss of taste or sense of smell? no  Have you been around anyone with known Covid 19.  Have you been around anyone who is awaiting Covid 19 test results in the past 7 to 10 days? bo  Have you been around anyone who has been exposed to Covid 19, or has mentioned symptoms of Covid 19 within the past 7 to 10 days? no  If you have any concerns/questions about symptoms patients report during screening (either on the phone or at threshold). Contact the provider seeing the patient or DOD for further guidance.  If neither are available contact a member of the leadership team.

## 2018-09-28 NOTE — Assessment & Plan Note (Signed)
Moderate by echo 2017- PA 51 mmHg

## 2018-09-28 NOTE — Assessment & Plan Note (Signed)
H/O MVP

## 2018-09-28 NOTE — Progress Notes (Signed)
Cardiology Office Note:    Date:  09/28/2018   ID:  Ashley Washington, DOB 14-Dec-1932, MRN [id number]  PCP:  Ashley Infante, MD  Cardiologist:  Dr Stanford Breed Electrophysiologist:  None   Referring MD: Ashley Infante, MD   Chief Complaint  Patient presents with  . other    Chest discomfort. Medications reviewed verbally.     History of Present Illness:    Ashley Washington is a 85 y.o. female with a hx of mitral valve prolapse, fibromyalgia, and fibromuscular dysplasia.  She was last seen in the office about 18 months ago.  She is seen today with complaints of left-sided chest pain.  This is come on in the last couple weeks.  She was concerned that may be related to her heart and wondered if she needed another echocardiogram.  Her last echocardiogram showed preserved LV function with grade 1 diastolic dysfunction and moderate pulmonary hypertension with a PA pressure of 51 mmHg.  Patient also related that she has had some issues over the winter, she has had 2 bouts of bronchitis that her primary care doctor is treated with antibiotics and steroids.  She feels like she is finally getting over this.  Her primary care provider did suggest she have a CT scan but she wants to hold off on this at this time.  Past Medical History:  Diagnosis Date  . Cancer ([place])   . Fibromuscular dysplasia of renal artery ([place])   . Hypertension   . Mitral valve prolapse   . Thyroid disease     Past Surgical History:  Procedure Laterality Date  . ABDOMINAL HYSTERECTOMY    . APPENDECTOMY    . PARATHYROIDECTOMY    . TONSILLECTOMY      Current Medications: Current Meds  Medication Sig  . atenolol (TENORMIN) 25 MG tablet 1/2 tab po qd  . Bioflavonoid Products (ESTER-C) 500-200-60 MG TABS Take 1 tablet by mouth daily.  . Cholecalciferol (VITAMIN D3) 1000 UNITS CAPS Take by mouth.  . COD LIVER OIL PO Take 1 Dose by mouth daily.  . Coenzyme Q10 (CO Q 10 PO) Take 50 mg by mouth daily.   Marland Kitchen LORazepam (ATIVAN)  0.5 MG tablet 1/4 of tablet as needed  . meclizine (ANTIVERT) 25 MG tablet Take 1 tablet by mouth daily as needed.  . metroNIDAZOLE (METROGEL) 0.75 % gel Apply 1 application topically 2 (two) times daily.  . MULTIPLE VITAMIN PO Take by mouth.  . Multiple Vitamins-Minerals (ICAPS LUTEIN & ZEAXANTHIN PO) Take by mouth.  . QUERCETIN PO Take by mouth.  . TURMERIC PO Take by mouth.  . vitamin E 200 UNIT capsule Take 200 Units by mouth daily.     Allergies:   Doxycycline, Fluorometholone, Other, and Pilocarpine   Social History   Socioeconomic History  . Marital status: Married    Spouse name: Not on file  . Number of children: Not on file  . Years of education: Not on file  . Highest education level: Not on file  Occupational History  . Not on file  Social Needs  . Financial resource strain: Not on file  . Food insecurity    Worry: Not on file    Inability: Not on file  . Transportation needs    Medical: Not on file    Non-medical: Not on file  Tobacco Use  . Smoking status: Never Smoker  . Smokeless tobacco: Never Used  Substance and Sexual Activity  . Alcohol use: Not on file  .  Drug use: Not on file  . Sexual activity: Not on file  Lifestyle  . Physical activity    Days per week: Not on file    Minutes per session: Not on file  . Stress: Not on file  Relationships  . Social Herbalist on phone: Not on file    Gets together: Not on file    Attends religious service: Not on file    Active member of club or organization: Not on file    Attends meetings of clubs or organizations: Not on file    Relationship status: Not on file  Other Topics Concern  . Not on file  Social History Narrative  . Not on file     Family History: The patient's family history includes Breast cancer in her paternal aunt and paternal aunt. There is no history of Colon cancer.  ROS:   Please see the history of present illness.     All other systems reviewed and are  negative.  EKGs/Labs/Other Studies Reviewed:    The following studies were reviewed today: Echo 2017  EKG:  EKG is ordered today.  The ekg ordered today demonstrates NSR HR 67, Qs in V1 and AVL (old)  Recent Labs: No results found for requested labs within last 8760 hours.  Recent Lipid Panel No results found for: CHOL, TRIG, HDL, CHOLHDL, VLDL, LDLCALC, LDLDIRECT  Physical Exam:    VS:  BP (!) 146/76 (BP Location: Right Arm, Patient Position: Sitting, Cuff Size: Normal)   Pulse 89   Temp 98.7 F (37.1 C) (Temporal)   Ht 5' 4.5" (1.638 m)   Wt 107 lb (48.5 kg)   SpO2 98%   BMI 18.08 kg/m     Wt Readings from Last 3 Encounters:  09/28/18 107 lb (48.5 kg)  04/17/17 112 lb 9.6 oz (51.1 kg)  02/09/15 108 lb 6.4 oz (49.2 kg)     GEN: Thin female well developed in no acute distress HEENT: Normal NECK: No JVD; No carotid bruits LYMPHATICS: No lymphadenopathy CARDIAC: RRR, no murmurs, rubs, gallops RESPIRATORY:  Overall decreased breath sounds ABDOMEN: Soft, non-tender, non-distended MUSCULOSKELETAL:  No edema; No deformity  SKIN: Warm and dry NEUROLOGIC:  Alert and oriented x 3 PSYCHIATRIC:  Normal affect   ASSESSMENT:    Chest pain Patient sen today with complaints of SSCP that lasted 20-30 minutes  Renal fibromuscular dysplasia 1-59% Rt RAS by RA doppler Aug 2012  Fibromyalgia Followed by PCP  Mitral valve disorder H/O MVP  Pulmonary HTN ([place]) Moderate by echo 2017- PA 51 mmHg  PLAN:    After discussion with the patient I will order an echo to f/u her pulmonary HTN and a Lexiscan to r/o CAD.  I did prescribe SL NTG PRN as well.  F/U with dr Stanford Breed.    Medication Adjustments/Labs and Tests Ordered: Current medicines are reviewed at length with the patient today.  Concerns regarding medicines are outlined above.  Orders Placed This Encounter  Procedures  . MYOCARDIAL PERFUSION IMAGING  . ECHOCARDIOGRAM COMPLETE   No orders of the defined types were  placed in this encounter.   Patient Instructions  Medication Instructions:  Your physician recommends that you continue on your current medications as directed. Please refer to the Current Medication list given to you today. If you need a refill on your cardiac medications before your next appointment, please call your pharmacy.   Lab work: None  If you have labs (blood work) drawn today and  your tests are completely normal, you will receive your results only by: Marland Kitchen MyChart Message (if you have MyChart) OR . A paper copy in the mail If you have any lab test that is abnormal or we need to change your treatment, we will call you to review the results.  Testing/Procedures: Your physician has requested that you have an echocardiogram. Echocardiography is a painless test that uses sound waves to create images of your heart. It provides your doctor with information about the size and shape of your heart and how well your heart's chambers and valves are working. This procedure takes approximately one hour. There are no restrictions for this procedure. [place] has requested that you have a lexiscan myoview. For further information please visit HugeFiesta.tn. Please follow instruction sheet, as given.  Follow-Up: At [facility], you and your health needs are our priority.  As part of our continuing mission to provide you with exceptional heart care, we have created designated Provider Care Teams.  These Care Teams include your primary Cardiologist (physician) and Advanced Practice Providers (APPs -  Physician Assistants and Nurse Practitioners) who all work together to provide you with the care you need, when you need it. You will need a follow up appointment in 6 months.  Please call our office 2 months in advance to schedule this appointment.  You may see Kirk Ruths, MD or one of the following Advanced Practice Providers on your designated Care Team:    Kerin Ransom, PA-C Roby Lofts, [place] . Sande Rives, PA-C  Any Other Special Instructions Will Be Listed Below (If Applicable).      Signed, Kerin Ransom, PA-C  09/28/2018 3:12 PM    [facility]

## 2018-09-28 NOTE — Assessment & Plan Note (Signed)
Followed by PCP

## 2018-09-28 NOTE — Patient Instructions (Signed)
Medication Instructions:  Your physician recommends that you continue on your current medications as directed. Please refer to the Current Medication list given to you today. If you need a refill on your cardiac medications before your next appointment, please call your pharmacy.   Lab work: None  If you have labs (blood work) drawn today and your tests are completely normal, you will receive your results only by: Marland Kitchen MyChart Message (if you have MyChart) OR . A paper copy in the mail If you have any lab test that is abnormal or we need to change your treatment, we will call you to review the results.  Testing/Procedures: Your physician has requested that you have an echocardiogram. Echocardiography is a painless test that uses sound waves to create images of your heart. It provides your doctor with information about the size and shape of your heart and how well your heart's chambers and valves are working. This procedure takes approximately one hour. There are no restrictions for this procedure. [place] has requested that you have a lexiscan myoview. For further information please visit HugeFiesta.[email or web address]. Please follow instruction sheet, as given.  Follow-Up: At [facility], you and your health needs are our priority.  As part of our continuing mission to provide you with exceptional heart care, we have created designated Provider Care Teams.  These Care Teams include your primary Cardiologist (physician) and Advanced Practice Providers (APPs -  Physician Assistants and Nurse Practitioners) who all work together to provide you with the care you need, when you need it. You will need a follow up appointment in 6 months.  Please call our office 2 months in advance to schedule this appointment.  You may see Kirk Ruths, MD or one of the following Advanced Practice Providers on your designated Care Team:   Kerin Ransom, PA-C Roby Lofts, [place] . Sande Rives, PA-C  Any Other Special Instructions Will Be Listed Below (If Applicable).

## 2018-09-29 NOTE — Addendum Note (Signed)
Addended by: Diana Eves on: 09/29/2018 03:54 PM   Modules accepted: Orders

## 2018-09-30 ENCOUNTER — Other Ambulatory Visit: Payer: Self-pay

## 2018-09-30 MED ORDER — NITROGLYCERIN 0.4 MG SL SUBL: 0.4000 mg | SUBLINGUAL_TABLET | SUBLINGUAL | 3 refills | Status: AC | PRN

## 2018-09-30 NOTE — Telephone Encounter (Signed)
Received call from patient stating her Nitrostat was not sent to the pharmacy. I apologized to patient and sent the rx. Gave patient instructions on how to use the nitrostat she voiced understanding.

## 2018-10-28 DIAGNOSIS — L718 Other rosacea: Secondary | ICD-10-CM | POA: Diagnosis not present

## 2018-10-28 DIAGNOSIS — H0288B Meibomian gland dysfunction left eye, upper and lower eyelids: Secondary | ICD-10-CM | POA: Diagnosis not present

## 2018-10-28 DIAGNOSIS — H0288A Meibomian gland dysfunction right eye, upper and lower eyelids: Secondary | ICD-10-CM | POA: Diagnosis not present

## 2018-10-28 DIAGNOSIS — Z961 Presence of intraocular lens: Secondary | ICD-10-CM | POA: Diagnosis not present

## 2018-10-28 DIAGNOSIS — T7840XA Allergy, unspecified, initial encounter: Secondary | ICD-10-CM | POA: Diagnosis not present

## 2018-10-29 ENCOUNTER — Other Ambulatory Visit: Payer: Medicare Other

## 2018-11-05 ENCOUNTER — Other Ambulatory Visit: Payer: Medicare Other

## 2018-11-23 ENCOUNTER — Ambulatory Visit (HOSPITAL_COMMUNITY): Payer: Medicare Other | Attending: Cardiology

## 2018-11-23 ENCOUNTER — Other Ambulatory Visit: Payer: Self-pay

## 2018-11-23 ENCOUNTER — Encounter (INDEPENDENT_AMBULATORY_CARE_PROVIDER_SITE_OTHER): Payer: Self-pay

## 2018-11-23 DIAGNOSIS — R079 Chest pain, unspecified: Secondary | ICD-10-CM | POA: Diagnosis not present

## 2018-11-24 DIAGNOSIS — Z0000 Encounter for general adult medical examination without abnormal findings: Secondary | ICD-10-CM | POA: Diagnosis not present

## 2018-11-24 DIAGNOSIS — I2510 Atherosclerotic heart disease of native coronary artery without angina pectoris: Secondary | ICD-10-CM | POA: Diagnosis not present

## 2018-11-24 DIAGNOSIS — M797 Fibromyalgia: Secondary | ICD-10-CM | POA: Diagnosis not present

## 2018-11-24 DIAGNOSIS — E559 Vitamin D deficiency, unspecified: Secondary | ICD-10-CM | POA: Diagnosis not present

## 2018-11-25 ENCOUNTER — Telehealth: Payer: Self-pay

## 2018-11-25 NOTE — Telephone Encounter (Addendum)
Left a voice message for the patient to give our office a call back for her ECHO results.  ----- Message from Erlene Quan, PA-C sent at 11/24/2018 12:14 PM EDT ----- Please let the patient know her echo looked good- no change from her last echo. Same Rx.  Kerin Ransom PA-C 11/24/2018 12:14 PM

## 2018-11-25 NOTE — Telephone Encounter (Signed)
Patient returned call. The patient was informed of the result and verbalized understanding.  All questions (if any) were answered. Jacqulynn Cadet, [place] 11/25/2018 3:36 PM

## 2018-12-01 DIAGNOSIS — D696 Thrombocytopenia, unspecified: Secondary | ICD-10-CM | POA: Diagnosis not present

## 2018-12-01 DIAGNOSIS — Z0000 Encounter for general adult medical examination without abnormal findings: Secondary | ICD-10-CM | POA: Diagnosis not present

## 2018-12-01 DIAGNOSIS — M810 Age-related osteoporosis without current pathological fracture: Secondary | ICD-10-CM | POA: Diagnosis not present

## 2018-12-01 DIAGNOSIS — I2510 Atherosclerotic heart disease of native coronary artery without angina pectoris: Secondary | ICD-10-CM | POA: Diagnosis not present

## 2018-12-01 DIAGNOSIS — M2550 Pain in unspecified joint: Secondary | ICD-10-CM | POA: Diagnosis not present

## 2018-12-01 DIAGNOSIS — R0789 Other chest pain: Secondary | ICD-10-CM | POA: Diagnosis not present

## 2018-12-01 DIAGNOSIS — Z1331 Encounter for screening for depression: Secondary | ICD-10-CM | POA: Diagnosis not present

## 2018-12-01 DIAGNOSIS — R82998 Other abnormal findings in urine: Secondary | ICD-10-CM | POA: Diagnosis not present

## 2018-12-01 DIAGNOSIS — H9190 Unspecified hearing loss, unspecified ear: Secondary | ICD-10-CM | POA: Diagnosis not present

## 2018-12-01 DIAGNOSIS — I700 Atherosclerosis of aorta: Secondary | ICD-10-CM | POA: Diagnosis not present

## 2018-12-01 DIAGNOSIS — D126 Benign neoplasm of colon, unspecified: Secondary | ICD-10-CM | POA: Diagnosis not present

## 2018-12-01 DIAGNOSIS — J439 Emphysema, unspecified: Secondary | ICD-10-CM | POA: Diagnosis not present

## 2018-12-01 DIAGNOSIS — M546 Pain in thoracic spine: Secondary | ICD-10-CM | POA: Diagnosis not present

## 2018-12-01 DIAGNOSIS — K449 Diaphragmatic hernia without obstruction or gangrene: Secondary | ICD-10-CM | POA: Diagnosis not present

## 2018-12-03 DIAGNOSIS — Z1212 Encounter for screening for malignant neoplasm of rectum: Secondary | ICD-10-CM | POA: Diagnosis not present

## 2019-03-30 ENCOUNTER — Telehealth: Payer: Self-pay | Admitting: *Deleted

## 2019-03-30 NOTE — Telephone Encounter (Signed)
A message was left re: her follow up visit. 

## 2019-04-06 DIAGNOSIS — D225 Melanocytic nevi of trunk: Secondary | ICD-10-CM | POA: Diagnosis not present

## 2019-04-06 DIAGNOSIS — D1801 Hemangioma of skin and subcutaneous tissue: Secondary | ICD-10-CM | POA: Diagnosis not present

## 2019-04-06 DIAGNOSIS — Z85828 Personal history of other malignant neoplasm of skin: Secondary | ICD-10-CM | POA: Diagnosis not present

## 2019-04-06 DIAGNOSIS — L821 Other seborrheic keratosis: Secondary | ICD-10-CM | POA: Diagnosis not present

## 2019-04-06 DIAGNOSIS — Z85820 Personal history of malignant melanoma of skin: Secondary | ICD-10-CM | POA: Diagnosis not present

## 2019-04-06 DIAGNOSIS — L84 Corns and callosities: Secondary | ICD-10-CM | POA: Diagnosis not present

## 2019-04-06 DIAGNOSIS — L570 Actinic keratosis: Secondary | ICD-10-CM | POA: Diagnosis not present

## 2019-05-14 DIAGNOSIS — M5416 Radiculopathy, lumbar region: Secondary | ICD-10-CM | POA: Diagnosis not present

## 2019-05-14 DIAGNOSIS — M797 Fibromyalgia: Secondary | ICD-10-CM | POA: Diagnosis not present

## 2019-05-14 DIAGNOSIS — M5380 Other specified dorsopathies, site unspecified: Secondary | ICD-10-CM | POA: Diagnosis not present

## 2019-05-14 DIAGNOSIS — F419 Anxiety disorder, unspecified: Secondary | ICD-10-CM | POA: Diagnosis not present

## 2019-05-14 DIAGNOSIS — R451 Restlessness and agitation: Secondary | ICD-10-CM | POA: Diagnosis not present

## 2019-05-18 ENCOUNTER — Other Ambulatory Visit: Payer: Self-pay | Admitting: Internal Medicine

## 2019-05-18 DIAGNOSIS — M51369 Other intervertebral disc degeneration, lumbar region without mention of lumbar back pain or lower extremity pain: Secondary | ICD-10-CM

## 2019-05-18 DIAGNOSIS — M5416 Radiculopathy, lumbar region: Secondary | ICD-10-CM

## 2019-05-18 DIAGNOSIS — M5136 Other intervertebral disc degeneration, lumbar region: Secondary | ICD-10-CM

## 2019-05-23 ENCOUNTER — Ambulatory Visit
Admission: RE | Admit: 2019-05-23 | Discharge: 2019-05-23 | Disposition: A | Discharge status: Home / Self Care | Payer: Medicare Other | Source: Ambulatory Visit | Attending: Internal Medicine | Admitting: Internal Medicine

## 2019-05-23 ENCOUNTER — Other Ambulatory Visit: Payer: Self-pay

## 2019-05-23 DIAGNOSIS — M5136 Other intervertebral disc degeneration, lumbar region: Secondary | ICD-10-CM

## 2019-05-23 DIAGNOSIS — M48061 Spinal stenosis, lumbar region without neurogenic claudication: Secondary | ICD-10-CM | POA: Diagnosis not present

## 2019-05-23 DIAGNOSIS — M5416 Radiculopathy, lumbar region: Secondary | ICD-10-CM

## 2019-05-23 DIAGNOSIS — M51369 Other intervertebral disc degeneration, lumbar region without mention of lumbar back pain or lower extremity pain: Secondary | ICD-10-CM

## 2019-05-24 DIAGNOSIS — Z23 Encounter for immunization: Secondary | ICD-10-CM | POA: Diagnosis not present

## 2019-06-09 DIAGNOSIS — M5432 Sciatica, left side: Secondary | ICD-10-CM | POA: Diagnosis not present

## 2019-06-09 DIAGNOSIS — M5136 Other intervertebral disc degeneration, lumbar region: Secondary | ICD-10-CM | POA: Diagnosis not present

## 2019-06-09 DIAGNOSIS — G5702 Lesion of sciatic nerve, left lower limb: Secondary | ICD-10-CM | POA: Insufficient documentation

## 2019-06-16 DIAGNOSIS — M5416 Radiculopathy, lumbar region: Secondary | ICD-10-CM | POA: Diagnosis not present

## 2019-06-23 DIAGNOSIS — M5432 Sciatica, left side: Secondary | ICD-10-CM | POA: Diagnosis not present

## 2019-07-28 ENCOUNTER — Other Ambulatory Visit: Payer: Self-pay | Admitting: Neurological Surgery

## 2019-08-11 DIAGNOSIS — J439 Emphysema, unspecified: Secondary | ICD-10-CM | POA: Diagnosis not present

## 2019-08-11 DIAGNOSIS — M5416 Radiculopathy, lumbar region: Secondary | ICD-10-CM | POA: Diagnosis not present

## 2019-08-11 DIAGNOSIS — M810 Age-related osteoporosis without current pathological fracture: Secondary | ICD-10-CM | POA: Diagnosis not present

## 2019-08-11 DIAGNOSIS — M797 Fibromyalgia: Secondary | ICD-10-CM | POA: Diagnosis not present

## 2019-08-11 DIAGNOSIS — M48061 Spinal stenosis, lumbar region without neurogenic claudication: Secondary | ICD-10-CM | POA: Diagnosis not present

## 2019-08-11 DIAGNOSIS — R451 Restlessness and agitation: Secondary | ICD-10-CM | POA: Diagnosis not present

## 2019-08-11 DIAGNOSIS — Z79899 Other long term (current) drug therapy: Secondary | ICD-10-CM | POA: Diagnosis not present

## 2019-08-11 DIAGNOSIS — R609 Edema, unspecified: Secondary | ICD-10-CM | POA: Diagnosis not present

## 2019-08-11 DIAGNOSIS — I2510 Atherosclerotic heart disease of native coronary artery without angina pectoris: Secondary | ICD-10-CM | POA: Diagnosis not present

## 2019-08-11 DIAGNOSIS — F419 Anxiety disorder, unspecified: Secondary | ICD-10-CM | POA: Diagnosis not present

## 2019-08-17 DIAGNOSIS — M4726 Other spondylosis with radiculopathy, lumbar region: Secondary | ICD-10-CM | POA: Diagnosis not present

## 2019-08-17 DIAGNOSIS — M545 Low back pain: Secondary | ICD-10-CM | POA: Diagnosis not present

## 2019-09-14 DIAGNOSIS — M5416 Radiculopathy, lumbar region: Secondary | ICD-10-CM | POA: Diagnosis not present

## 2019-09-14 DIAGNOSIS — M5116 Intervertebral disc disorders with radiculopathy, lumbar region: Secondary | ICD-10-CM | POA: Diagnosis not present

## 2019-09-16 ENCOUNTER — Other Ambulatory Visit: Payer: Self-pay | Admitting: Internal Medicine

## 2019-09-16 DIAGNOSIS — M810 Age-related osteoporosis without current pathological fracture: Secondary | ICD-10-CM

## 2019-10-13 DIAGNOSIS — M4316 Spondylolisthesis, lumbar region: Secondary | ICD-10-CM | POA: Diagnosis not present

## 2019-10-27 DIAGNOSIS — M4316 Spondylolisthesis, lumbar region: Secondary | ICD-10-CM | POA: Diagnosis not present

## 2019-11-10 DIAGNOSIS — L718 Other rosacea: Secondary | ICD-10-CM | POA: Diagnosis not present

## 2019-11-10 DIAGNOSIS — H3530 Unspecified macular degeneration: Secondary | ICD-10-CM | POA: Diagnosis not present

## 2019-11-10 DIAGNOSIS — H0288B Meibomian gland dysfunction left eye, upper and lower eyelids: Secondary | ICD-10-CM | POA: Diagnosis not present

## 2019-11-10 DIAGNOSIS — Z961 Presence of intraocular lens: Secondary | ICD-10-CM | POA: Diagnosis not present

## 2019-11-10 DIAGNOSIS — H0288A Meibomian gland dysfunction right eye, upper and lower eyelids: Secondary | ICD-10-CM | POA: Diagnosis not present

## 2019-11-17 DIAGNOSIS — M4316 Spondylolisthesis, lumbar region: Secondary | ICD-10-CM | POA: Diagnosis not present

## 2019-11-24 DIAGNOSIS — M4316 Spondylolisthesis, lumbar region: Secondary | ICD-10-CM | POA: Diagnosis not present

## 2019-12-02 DIAGNOSIS — E559 Vitamin D deficiency, unspecified: Secondary | ICD-10-CM | POA: Diagnosis not present

## 2019-12-02 DIAGNOSIS — I2510 Atherosclerotic heart disease of native coronary artery without angina pectoris: Secondary | ICD-10-CM | POA: Diagnosis not present

## 2019-12-02 DIAGNOSIS — R5383 Other fatigue: Secondary | ICD-10-CM | POA: Diagnosis not present

## 2019-12-08 DIAGNOSIS — F419 Anxiety disorder, unspecified: Secondary | ICD-10-CM | POA: Diagnosis not present

## 2019-12-08 DIAGNOSIS — M48061 Spinal stenosis, lumbar region without neurogenic claudication: Secondary | ICD-10-CM | POA: Diagnosis not present

## 2019-12-08 DIAGNOSIS — R82998 Other abnormal findings in urine: Secondary | ICD-10-CM | POA: Diagnosis not present

## 2019-12-08 DIAGNOSIS — M1990 Unspecified osteoarthritis, unspecified site: Secondary | ICD-10-CM | POA: Diagnosis not present

## 2019-12-08 DIAGNOSIS — N281 Cyst of kidney, acquired: Secondary | ICD-10-CM | POA: Diagnosis not present

## 2019-12-08 DIAGNOSIS — E559 Vitamin D deficiency, unspecified: Secondary | ICD-10-CM | POA: Diagnosis not present

## 2019-12-08 DIAGNOSIS — Z0000 Encounter for general adult medical examination without abnormal findings: Secondary | ICD-10-CM | POA: Diagnosis not present

## 2019-12-08 DIAGNOSIS — I700 Atherosclerosis of aorta: Secondary | ICD-10-CM | POA: Diagnosis not present

## 2019-12-08 DIAGNOSIS — D72818 Other decreased white blood cell count: Secondary | ICD-10-CM | POA: Diagnosis not present

## 2019-12-08 DIAGNOSIS — I341 Nonrheumatic mitral (valve) prolapse: Secondary | ICD-10-CM | POA: Diagnosis not present

## 2019-12-08 DIAGNOSIS — R609 Edema, unspecified: Secondary | ICD-10-CM | POA: Diagnosis not present

## 2019-12-08 DIAGNOSIS — M810 Age-related osteoporosis without current pathological fracture: Secondary | ICD-10-CM | POA: Diagnosis not present

## 2019-12-15 DIAGNOSIS — M4316 Spondylolisthesis, lumbar region: Secondary | ICD-10-CM | POA: Diagnosis not present

## 2019-12-29 DIAGNOSIS — M4316 Spondylolisthesis, lumbar region: Secondary | ICD-10-CM | POA: Diagnosis not present

## 2020-01-12 DIAGNOSIS — M4316 Spondylolisthesis, lumbar region: Secondary | ICD-10-CM | POA: Diagnosis not present

## 2020-01-13 DIAGNOSIS — D171 Benign lipomatous neoplasm of skin and subcutaneous tissue of trunk: Secondary | ICD-10-CM | POA: Diagnosis not present

## 2020-01-13 DIAGNOSIS — Z85828 Personal history of other malignant neoplasm of skin: Secondary | ICD-10-CM | POA: Diagnosis not present

## 2020-01-13 DIAGNOSIS — L570 Actinic keratosis: Secondary | ICD-10-CM | POA: Diagnosis not present

## 2020-01-13 DIAGNOSIS — L853 Xerosis cutis: Secondary | ICD-10-CM | POA: Diagnosis not present

## 2020-01-13 DIAGNOSIS — L821 Other seborrheic keratosis: Secondary | ICD-10-CM | POA: Diagnosis not present

## 2020-01-13 DIAGNOSIS — D692 Other nonthrombocytopenic purpura: Secondary | ICD-10-CM | POA: Diagnosis not present

## 2020-01-15 DIAGNOSIS — Z23 Encounter for immunization: Secondary | ICD-10-CM | POA: Diagnosis not present

## 2020-02-07 DIAGNOSIS — M4316 Spondylolisthesis, lumbar region: Secondary | ICD-10-CM | POA: Diagnosis not present

## 2020-04-09 ENCOUNTER — Emergency Department (HOSPITAL_BASED_OUTPATIENT_CLINIC_OR_DEPARTMENT_OTHER): Payer: Medicare Other

## 2020-04-09 ENCOUNTER — Encounter (HOSPITAL_BASED_OUTPATIENT_CLINIC_OR_DEPARTMENT_OTHER): Payer: Self-pay | Admitting: Emergency Medicine

## 2020-04-09 ENCOUNTER — Other Ambulatory Visit: Payer: Self-pay

## 2020-04-09 ENCOUNTER — Emergency Department (HOSPITAL_BASED_OUTPATIENT_CLINIC_OR_DEPARTMENT_OTHER)
Admission: EM | Admit: 2020-04-09 | Discharge: 2020-04-09 | Disposition: A | Discharge status: Home / Self Care | Payer: Medicare Other | Attending: Emergency Medicine | Admitting: Emergency Medicine

## 2020-04-09 DIAGNOSIS — Z859 Personal history of malignant neoplasm, unspecified: Secondary | ICD-10-CM | POA: Insufficient documentation

## 2020-04-09 DIAGNOSIS — I10 Essential (primary) hypertension: Secondary | ICD-10-CM | POA: Insufficient documentation

## 2020-04-09 DIAGNOSIS — Z79899 Other long term (current) drug therapy: Secondary | ICD-10-CM | POA: Insufficient documentation

## 2020-04-09 DIAGNOSIS — S20211A Contusion of right front wall of thorax, initial encounter: Secondary | ICD-10-CM | POA: Diagnosis not present

## 2020-04-09 DIAGNOSIS — S7001XA Contusion of right hip, initial encounter: Secondary | ICD-10-CM | POA: Diagnosis not present

## 2020-04-09 DIAGNOSIS — M545 Low back pain, unspecified: Secondary | ICD-10-CM | POA: Diagnosis not present

## 2020-04-09 DIAGNOSIS — W010XXA Fall on same level from slipping, tripping and stumbling without subsequent striking against object, initial encounter: Secondary | ICD-10-CM | POA: Diagnosis not present

## 2020-04-09 DIAGNOSIS — Y9301 Activity, walking, marching and hiking: Secondary | ICD-10-CM | POA: Diagnosis not present

## 2020-04-09 DIAGNOSIS — M546 Pain in thoracic spine: Secondary | ICD-10-CM | POA: Diagnosis not present

## 2020-04-09 DIAGNOSIS — J984 Other disorders of lung: Secondary | ICD-10-CM | POA: Diagnosis not present

## 2020-04-09 DIAGNOSIS — M25551 Pain in right hip: Secondary | ICD-10-CM | POA: Diagnosis not present

## 2020-04-09 DIAGNOSIS — W19XXXA Unspecified fall, initial encounter: Secondary | ICD-10-CM

## 2020-04-09 DIAGNOSIS — M419 Scoliosis, unspecified: Secondary | ICD-10-CM | POA: Diagnosis not present

## 2020-04-09 DIAGNOSIS — S79911A Unspecified injury of right hip, initial encounter: Secondary | ICD-10-CM | POA: Diagnosis present

## 2020-04-09 DIAGNOSIS — Y92009 Unspecified place in unspecified non-institutional (private) residence as the place of occurrence of the external cause: Secondary | ICD-10-CM | POA: Insufficient documentation

## 2020-04-09 DIAGNOSIS — I517 Cardiomegaly: Secondary | ICD-10-CM | POA: Diagnosis not present

## 2020-04-09 DIAGNOSIS — M1611 Unilateral primary osteoarthritis, right hip: Secondary | ICD-10-CM | POA: Diagnosis not present

## 2020-04-09 DIAGNOSIS — I700 Atherosclerosis of aorta: Secondary | ICD-10-CM | POA: Diagnosis not present

## 2020-04-09 DIAGNOSIS — N83201 Unspecified ovarian cyst, right side: Secondary | ICD-10-CM | POA: Diagnosis not present

## 2020-04-09 DIAGNOSIS — R52 Pain, unspecified: Secondary | ICD-10-CM | POA: Diagnosis not present

## 2020-04-09 MED ORDER — FENTANYL CITRATE (PF) 100 MCG/2ML IJ SOLN
50.0000 ug | Freq: Once | INTRAMUSCULAR | Status: AC
  Administered 2020-04-09: 50 ug via INTRAVENOUS
  Filled 2020-04-09: qty 2

## 2020-04-09 MED ORDER — ONDANSETRON HCL 4 MG/2ML IJ SOLN
4.0000 mg | Freq: Once | INTRAMUSCULAR | Status: AC
  Administered 2020-04-09: 4 mg via INTRAVENOUS
  Filled 2020-04-09: qty 2

## 2020-04-09 NOTE — ED Triage Notes (Signed)
States," I tripped going to the BR" Denies LOC, pain to lower back and hips

## 2020-04-09 NOTE — ED Provider Notes (Signed)
[place] EMERGENCY DEPARTMENT Provider Note   CSN: [id number] Arrival date & time: 04/09/20  1506     History Chief Complaint  Patient presents with  . Back Pain    DARNEISHA WINDHORST is a 87 y.o. female.  Patient is 87 year old female who presents after a fall.  She had a coughing spell and she had to go to the bathroom.  She was walking down the hall, she tripped and fell onto her right side.  She says she did not hit her head.  She denies any loss of consciousness.  No neck pain.  No headache.  She complains of pain to her right ribs and her lower back and hip area.  She says she has a history of spinal stenosis and that was improving with some physical therapy and epidural injections.  She feels like she exacerbated it with the fall.  She has no radiation of pain down her legs.  She has no numbness or weakness to her legs.  No shortness of breath.  No abdominal pain.  No nausea or vomiting.  She is not on anticoagulants.        Past Medical History:  Diagnosis Date  . Cancer ([place])   . Fibromuscular dysplasia of renal artery ([place])   . Hypertension   . Mitral valve prolapse   . Thyroid disease     Patient Active Problem List   Diagnosis Date Noted  . Fibromyalgia 09/28/2018  . Pulmonary HTN ([place]) 09/28/2018  . Edema of both legs 03/17/2014  . Renal fibromuscular dysplasia ([place]) 03/17/2014  . DJD - back 03/17/2014  . Mitral valve disorder 01/05/2009  . Palpitations 01/05/2009  . Chest pain 01/05/2009    Past Surgical History:  Procedure Laterality Date  . ABDOMINAL HYSTERECTOMY    . APPENDECTOMY    . PARATHYROIDECTOMY    . TONSILLECTOMY       OB History   No obstetric history on file.     Family History  Problem Relation Age of Onset  . Breast cancer Paternal Aunt   . Breast cancer Paternal Aunt   . Colon cancer Neg Hx     Social History   Tobacco Use  . Smoking status: Never Smoker  . Smokeless tobacco: Never Used  Substance Use  Topics  . Alcohol use: Yes    Comment: social  . Drug use: Never    Home Medications Prior to Admission medications   Medication Sig Start Date End Date Taking? Authorizing Provider  atenolol (TENORMIN) 25 MG tablet 1/2 tab po qd Patient taking differently: 25 mg. 1/2 tab po qd 06/09/12  Yes Crenshaw, Denice Bors, MD  LORazepam (ATIVAN) 0.5 MG tablet 1/4 of tablet as needed 06/12/12  Yes Crenshaw, Denice Bors, MD  metroNIDAZOLE (METROGEL) 0.75 % gel Apply 1 application topically 2 (two) times daily.   Yes [provider]  Bioflavonoid Products (ESTER-C) 500-200-60 MG TABS Take 1 tablet by mouth daily.    [provider]  Cholecalciferol (VITAMIN D3) 1000 UNITS CAPS Take by mouth.    [provider]  COD LIVER OIL PO Take 1 Dose by mouth daily.    [provider]  Coenzyme Q10 (CO Q 10 PO) Take 50 mg by mouth daily.     [provider]  meclizine (ANTIVERT) 25 MG tablet Take 1 tablet by mouth daily as needed. 12/27/14   [provider]  MULTIPLE VITAMIN PO Take by mouth.    [provider]  Multiple Vitamins-Minerals (ICAPS LUTEIN & ZEAXANTHIN PO) Take by mouth.    [provider]  nitroGLYCERIN (NITROSTAT) 0.4 MG SL tablet Place 1 tablet (0.4 mg total) under the tongue every 5 (five) minutes as needed for chest pain. 09/30/18 10/30/18  Erlene Quan, PA-C  QUERCETIN PO Take by mouth.    [provider]  TURMERIC PO Take by mouth.    [provider]  vitamin E 200 UNIT capsule Take 200 Units by mouth daily.    [provider]    Allergies    Doxycycline, Fluorometholone, Other, and Pilocarpine  Review of Systems   Review of Systems  Constitutional: Negative for activity change, appetite change and fever.  HENT: Negative for dental problem, nosebleeds and trouble swallowing.   Eyes: Negative for pain and visual disturbance.  Respiratory: Negative for shortness of breath.   Cardiovascular: Positive  for chest pain (Right rib pain).  Gastrointestinal: Negative for abdominal pain, nausea and vomiting.  Genitourinary: Negative for dysuria and hematuria.  Musculoskeletal: Positive for arthralgias and back pain. Negative for joint swelling and neck pain.  Skin: Negative for wound.  Neurological: Negative for weakness, numbness and headaches.  Psychiatric/Behavioral: Negative for confusion.    Physical Exam Updated Vital Signs BP (!) 136/57 (BP Location: Left Arm)   Pulse (!) 57   Temp 97.8 F (36.6 C) (Oral)   Resp 20   Ht 5\' 4"  (1.626 m)   Wt 48.9 kg   SpO2 96%   BMI 18.49 kg/m   Physical Exam Vitals reviewed.  Constitutional:      Appearance: She is well-developed and well-nourished.  HENT:     Head: Normocephalic and atraumatic.     Nose: Nose normal.      Comments: No hemotympanumEyes:     Conjunctiva/sclera: Conjunctivae normal.     Pupils: Pupils are equal, round, and reactive to light.  Neck:     Comments: No pain to the cervical or thoracic spine.  There is some tenderness to the lower lumbosacral spine.  No step-offs or deformities are noted Cardiovascular:     Rate and Rhythm: Normal rate and regular rhythm.     Heart sounds: No murmur heard.     Comments: No evidence of external trauma to the chest or abdomen Pulmonary:     Effort: Pulmonary effort is normal. No respiratory distress.     Breath sounds: Normal breath sounds. No wheezing.  Chest:     Chest wall: Tenderness (Positive tenderness to the right posterior lower ribs, no crepitus or deformity) present.  Abdominal:     General: Bowel sounds are normal. There is no distension.     Palpations: Abdomen is soft.     Tenderness: There is no abdominal tenderness.  Musculoskeletal:        General: Normal range of motion.     Comments: Positive tenderness on range of motion of her right hip.  Pedal pulses are intact.  There is no pain or range of motion of the other extremities.  She has a small abrasion  to the lateral aspect of her right knee and to the posterior aspect of her right shoulder.  There is no underlying bony tenderness or pain on range of motion of these areas.  Skin:    General: Skin is warm and dry.     Capillary Refill: Capillary refill takes less than 2 seconds.  Neurological:     Mental Status: She is alert and oriented to person, place, and  time.  Psychiatric:        Mood and Affect: Mood and affect normal.     ED Results / Procedures / Treatments   Labs (all labs ordered are listed, but only abnormal results are displayed) Labs Reviewed - No data to display  EKG None  Radiology DG Ribs Unilateral W/Chest Right  Result Date: 04/09/2020 CLINICAL DATA:  Right posterior rib pain following a fall today. EXAM: RIGHT RIBS AND CHEST - 3+ VIEW COMPARISON:  Chest radiographs dated 04/15/2006 FINDINGS: Interval mild enlargement of the cardiac silhouette. Tortuous and partially calcified thoracic aorta. Clear lungs. Minimal linear scarring at the left lateral lung base. No rib fracture or pneumothorax seen. Mild thoracolumbar scoliosis. IMPRESSION: 1. No rib fracture or pneumothorax. 2. Interval mild cardiomegaly. Electronically Signed   By: Claudie Revering M.D.   On: 04/09/2020 16:44   DG Lumbar Spine Complete  Result Date: 04/09/2020 CLINICAL DATA:  Low back and right hip pain following a fall today. EXAM: LUMBAR SPINE - COMPLETE 4+ VIEW COMPARISON:  Lumbar spine MR dated 10/07/2016 FINDINGS: Five non-rib-bearing lumbar vertebrae. Mild levoconvex thoracolumbar scoliosis. Diffuse osteopenia. No fractures, pars defects or subluxations seen. Facet degenerative changes in the lower lumbar spine. IMPRESSION: 1. No fracture or subluxation. 2. Facet degenerative changes in the lower lumbar spine. Electronically Signed   By: Claudie Revering M.D.   On: 04/09/2020 17:10   CT Hip Right Wo Contrast  Result Date: 04/09/2020 CLINICAL DATA:  Hip pain, negative radiographs EXAM: CT OF THE RIGHT  HIP WITHOUT CONTRAST TECHNIQUE: Multidetector CT imaging of the right hip was performed according to the standard protocol. Multiplanar CT image reconstructions were also generated. COMPARISON:  04/09/2020 FINDINGS: Bones/Joint/Cartilage No appreciable fracture or acute bony finding. No hip effusion observed. Mild degenerative chondral thinning in the right hip joint. Ligaments Suboptimally assessed by CT. Muscles and Tendons Unremarkable Soft tissues Prominence of stool in the visualized colon, constipation not excluded. We partially image a large cystic lesion in the left posterior pelvis, probably over 9 cm in diameter. A cystic ovarian malignancy cannot be excluded and this is only partially shown on today's exam. By report there was a cystic left ovarian lesion back on the ultrasound of 05/23/2006. Pelvic ultrasound is recommended to reassess. No regional bursitis identified. IMPRESSION: 1. No appreciable right hip fracture or acute bony finding. 2. Mild degenerative chondral thinning in the right hip joint. 3. Prominence of stool in the visualized colon, constipation not excluded. 4. We partially image a large cystic lesion in the left posterior pelvis, probably over 9 cm in diameter. A cystic ovarian malignancy cannot be excluded and this is only partially shown on today's exam. By report there was a cystic left ovarian lesion back on the ultrasound of 05/23/2006. Pelvic sonography is recommended for further characterization. Electronically Signed   By: Van Clines M.D.   On: 04/09/2020 17:59   DG Hip Unilat W or Wo Pelvis 2-3 Views Right  Result Date: 04/09/2020 CLINICAL DATA:  Low back and right hip pain following a fall. EXAM: DG HIP (WITH OR WITHOUT PELVIS) 2-3V RIGHT COMPARISON:  None. FINDINGS: There is no evidence of hip fracture or dislocation. There is no evidence of arthropathy or other focal bone abnormality. IMPRESSION: Normal examination. Electronically Signed   By: Claudie Revering M.D.    On: 04/09/2020 16:42    Procedures Procedures   Medications Ordered in ED Medications  fentaNYL (SUBLIMAZE) injection 50 mcg (50 mcg Intravenous Given 04/09/20 1759)  ondansetron (  ZOFRAN) injection 4 mg (4 mg Intravenous Given 04/09/20 1829)    ED Course  I have reviewed the triage vital signs and the nursing notes.  Pertinent labs & imaging results that were available during my care of the patient were reviewed by me and considered in my medical decision making (see chart for details).    MDM Rules/Calculators/A&P                          Patient is a 87 year old female who presents with hip and back pain after fall.  She also had some pain along her right ribs.  X-rays did not reveal any evidence of fractures in these areas.  I did do a CT scan of her hip to rule out an occult fracture.  We do not have MRI available at our facility.  This was negative for fracture.  Following dose of pain medication, she was able to put weight and ambulate on the leg with not too much difficulty.  She does note that she has a cystic structure in her left pelvis.  She states that she is aware of this and has had follow-up in the past.  I did advise her to let her doctor know about this just to make sure that is the same cyst that they have been following.  She has tramadol at home to use for pain.  Return precautions were given. Final Clinical Impression(s) / ED Diagnoses Final diagnoses:  Acute midline low back pain without sciatica  Fall, initial encounter  Contusion of right hip, initial encounter  Contusion of rib on right side, initial encounter    Rx / DC Orders ED Discharge Orders    None       Malvin Johns, MD 04/09/20 [phone]

## 2020-04-09 NOTE — ED Notes (Signed)
Pt ambulated in room with asst.; tolerated well

## 2020-04-09 NOTE — Discharge Instructions (Addendum)
Follow-up with your primary care doctor for recheck.  You have a cyst in the left pelvis.  Please discuss this with your primary care doctor to make sure that they are aware of this.  He may take the tramadol that you have at home as needed for pain.  You can also take Tylenol.  You should take a stool softener along with the tramadol.  Return here as needed if you have any worsening symptoms.

## 2020-04-09 NOTE — ED Triage Notes (Signed)
Pt arrives via EMS c/o trip and fall at home. Denies LOC, no blood thinners. C/o R flank pain

## 2020-04-09 NOTE — ED Notes (Signed)
Patient transported to X-ray 

## 2020-04-12 ENCOUNTER — Ambulatory Visit: Payer: Self-pay | Admitting: *Deleted

## 2020-04-12 DIAGNOSIS — W19XXXS Unspecified fall, sequela: Secondary | ICD-10-CM | POA: Diagnosis not present

## 2020-04-12 DIAGNOSIS — F419 Anxiety disorder, unspecified: Secondary | ICD-10-CM | POA: Diagnosis not present

## 2020-04-12 DIAGNOSIS — M48061 Spinal stenosis, lumbar region without neurogenic claudication: Secondary | ICD-10-CM | POA: Diagnosis not present

## 2020-04-12 NOTE — Telephone Encounter (Signed)
Patient's daughter called to report patient has increased swelling in coccyx area after a fall on Sunday. Was taken and treated in ED. Patient can not sit and screaming with pain from supine to sitting position with family assistance. Patient requires total assist to sit EOB. Patient daughter reports patient can walk with help after standing but can not sit to urinate and requires use of female urinal. Patient is taking tramadol for pain. Patient's daughter is uncertain if she can provide physical assistance to patient due to pain level. televisit completed today per daughter and possible MRI will be ordered. Instructed patient's daughter if she is unable to assist patient or patient can not move self to go back to ED. Patient's daughter awaiting call back from PCP. Care advise given. Patient's daughter verbalized understanding of care advise and to call back or go to ED if symptoms worsen.  Reason for Disposition . [1] SEVERE back pain (e.g., excruciating, unable to do any normal activities) AND [2] not improved 2 hours after pain medicine . [1] High-risk adult (e.g., age > 61 years, osteoporosis, chronic steroid use) AND [2] still hurts  Answer Assessment - Initial Assessment Questions 1. ONSET: "When did the pain begin?"      After a fall on Sunday  2. LOCATION: "Where does it hurt?" (upper, mid or lower back)     Coccyx area , ribs , back  3. SEVERITY: "How bad is the pain?"  (e.g., Scale 1-10; mild, moderate, or severe)   - MILD (1-3): doesn't interfere with normal activities    - MODERATE (4-7): interferes with normal activities or awakens from sleep    - SEVERE (8-10): excruciating pain, unable to do any normal activities      severe 4. PATTERN: "Is the pain constant?" (e.g., yes, no; constant, intermittent)      With movement 5. RADIATION: "Does the pain shoot into your legs or elsewhere?"     Soreness and pain that "catches" with movement  6. CAUSE:  "What do you think is causing the back  pain?"      fall 7. BACK OVERUSE:  "Any recent lifting of heavy objects, strenuous work or exercise?"     No fell Sunday  8. MEDICATIONS: "What have you taken so far for the pain?" (e.g., nothing, acetaminophen, NSAIDS)     tramadol 9. NEUROLOGIC SYMPTOMS: "Do you have any weakness, numbness, or problems with bowel/bladder control?"     No  10. OTHER SYMPTOMS: "Do you have any other symptoms?" (e.g., fever, abdominal pain, burning with urination, blood in urine)       Swelling noted to coccyx area 11. PREGNANCY: "Is there any chance you are pregnant?" (e.g., yes, no; LMP)       na  Answer Assessment - Initial Assessment Questions 1. MECHANISM: "How did the injury happen?"       Fall  2. ONSET: "When did the injury happen?" (Minutes or hours ago)      Sunday  3. LOCATION: "Where is the injury located?"      Coccyx area , ribs, back  4. SEVERITY: "Can you sit?" "Can you walk?"      difficulty sitting , can walk after getting up  5. PAIN: "Is there pain?" If Yes, ask: "How bad is the pain?"    (Scale 1-10; or mild, moderate, severe)     severe 6. SIZE: For bruises, or swelling, ask: "How large is it?" (e.g., inches or centimeters)      Swelling  7. OTHER SYMPTOMS: "Do you have any other symptoms?" (e.g., numbness, back pain)     Pain unable to move independently  8. PREGNANCY: "Is there any chance you are pregnant?" "When was your last menstrual period?"     na  Protocols used: BACK PAIN-A-AH, TAILBONE INJURY-A-AH

## 2020-04-13 ENCOUNTER — Emergency Department (HOSPITAL_COMMUNITY): Payer: Medicare Other

## 2020-04-13 ENCOUNTER — Other Ambulatory Visit: Payer: Self-pay

## 2020-04-13 ENCOUNTER — Encounter (HOSPITAL_COMMUNITY): Payer: Self-pay

## 2020-04-13 ENCOUNTER — Inpatient Hospital Stay (HOSPITAL_COMMUNITY)
Admission: EM | Admit: 2020-04-13 | Discharge: 2020-04-19 | DRG: 481 | Disposition: A | Discharge status: Skilled Nursing Facility | Payer: Medicare Other | Attending: Internal Medicine | Admitting: Internal Medicine

## 2020-04-13 DIAGNOSIS — S72001A Fracture of unspecified part of neck of right femur, initial encounter for closed fracture: Secondary | ICD-10-CM

## 2020-04-13 DIAGNOSIS — S72144D Nondisplaced intertrochanteric fracture of right femur, subsequent encounter for closed fracture with routine healing: Secondary | ICD-10-CM | POA: Diagnosis not present

## 2020-04-13 DIAGNOSIS — Z79899 Other long term (current) drug therapy: Secondary | ICD-10-CM

## 2020-04-13 DIAGNOSIS — M549 Dorsalgia, unspecified: Secondary | ICD-10-CM | POA: Diagnosis not present

## 2020-04-13 DIAGNOSIS — R531 Weakness: Secondary | ICD-10-CM | POA: Diagnosis not present

## 2020-04-13 DIAGNOSIS — W010XXA Fall on same level from slipping, tripping and stumbling without subsequent striking against object, initial encounter: Secondary | ICD-10-CM | POA: Diagnosis present

## 2020-04-13 DIAGNOSIS — I272 Pulmonary hypertension, unspecified: Secondary | ICD-10-CM | POA: Diagnosis not present

## 2020-04-13 DIAGNOSIS — S72009A Fracture of unspecified part of neck of unspecified femur, initial encounter for closed fracture: Secondary | ICD-10-CM | POA: Diagnosis present

## 2020-04-13 DIAGNOSIS — D696 Thrombocytopenia, unspecified: Secondary | ICD-10-CM | POA: Diagnosis present

## 2020-04-13 DIAGNOSIS — I059 Rheumatic mitral valve disease, unspecified: Secondary | ICD-10-CM | POA: Diagnosis present

## 2020-04-13 DIAGNOSIS — R2681 Unsteadiness on feet: Secondary | ICD-10-CM | POA: Diagnosis not present

## 2020-04-13 DIAGNOSIS — M797 Fibromyalgia: Secondary | ICD-10-CM | POA: Diagnosis not present

## 2020-04-13 DIAGNOSIS — R600 Localized edema: Secondary | ICD-10-CM | POA: Diagnosis not present

## 2020-04-13 DIAGNOSIS — I773 Arterial fibromuscular dysplasia: Secondary | ICD-10-CM | POA: Diagnosis present

## 2020-04-13 DIAGNOSIS — Z681 Body mass index (BMI) 19 or less, adult: Secondary | ICD-10-CM | POA: Diagnosis not present

## 2020-04-13 DIAGNOSIS — M48061 Spinal stenosis, lumbar region without neurogenic claudication: Secondary | ICD-10-CM | POA: Diagnosis present

## 2020-04-13 DIAGNOSIS — I10 Essential (primary) hypertension: Secondary | ICD-10-CM | POA: Diagnosis present

## 2020-04-13 DIAGNOSIS — S7291XA Unspecified fracture of right femur, initial encounter for closed fracture: Secondary | ICD-10-CM

## 2020-04-13 DIAGNOSIS — Z20822 Contact with and (suspected) exposure to covid-19: Secondary | ICD-10-CM | POA: Diagnosis present

## 2020-04-13 DIAGNOSIS — Z4889 Encounter for other specified surgical aftercare: Secondary | ICD-10-CM | POA: Diagnosis not present

## 2020-04-13 DIAGNOSIS — Z859 Personal history of malignant neoplasm, unspecified: Secondary | ICD-10-CM | POA: Diagnosis not present

## 2020-04-13 DIAGNOSIS — R32 Unspecified urinary incontinence: Secondary | ICD-10-CM | POA: Diagnosis present

## 2020-04-13 DIAGNOSIS — R278 Other lack of coordination: Secondary | ICD-10-CM | POA: Diagnosis not present

## 2020-04-13 DIAGNOSIS — I081 Rheumatic disorders of both mitral and tricuspid valves: Secondary | ICD-10-CM | POA: Diagnosis present

## 2020-04-13 DIAGNOSIS — L89159 Pressure ulcer of sacral region, unspecified stage: Secondary | ICD-10-CM | POA: Diagnosis not present

## 2020-04-13 DIAGNOSIS — E440 Moderate protein-calorie malnutrition: Secondary | ICD-10-CM | POA: Diagnosis not present

## 2020-04-13 DIAGNOSIS — S72141A Displaced intertrochanteric fracture of right femur, initial encounter for closed fracture: Secondary | ICD-10-CM | POA: Diagnosis not present

## 2020-04-13 DIAGNOSIS — R0602 Shortness of breath: Secondary | ICD-10-CM | POA: Diagnosis not present

## 2020-04-13 DIAGNOSIS — R52 Pain, unspecified: Secondary | ICD-10-CM | POA: Diagnosis not present

## 2020-04-13 DIAGNOSIS — M25551 Pain in right hip: Secondary | ICD-10-CM | POA: Diagnosis not present

## 2020-04-13 DIAGNOSIS — M1611 Unilateral primary osteoarthritis, right hip: Secondary | ICD-10-CM | POA: Diagnosis not present

## 2020-04-13 DIAGNOSIS — Z881 Allergy status to other antibiotic agents status: Secondary | ICD-10-CM | POA: Diagnosis not present

## 2020-04-13 DIAGNOSIS — Y92009 Unspecified place in unspecified non-institutional (private) residence as the place of occurrence of the external cause: Secondary | ICD-10-CM | POA: Diagnosis not present

## 2020-04-13 DIAGNOSIS — R5381 Other malaise: Secondary | ICD-10-CM | POA: Diagnosis not present

## 2020-04-13 DIAGNOSIS — Z888 Allergy status to other drugs, medicaments and biological substances status: Secondary | ICD-10-CM

## 2020-04-13 DIAGNOSIS — S72114A Nondisplaced fracture of greater trochanter of right femur, initial encounter for closed fracture: Secondary | ICD-10-CM | POA: Diagnosis not present

## 2020-04-13 DIAGNOSIS — M6281 Muscle weakness (generalized): Secondary | ICD-10-CM | POA: Diagnosis not present

## 2020-04-13 DIAGNOSIS — W19XXXD Unspecified fall, subsequent encounter: Secondary | ICD-10-CM | POA: Diagnosis not present

## 2020-04-13 DIAGNOSIS — Z7401 Bed confinement status: Secondary | ICD-10-CM | POA: Diagnosis not present

## 2020-04-13 DIAGNOSIS — H9190 Unspecified hearing loss, unspecified ear: Secondary | ICD-10-CM | POA: Diagnosis present

## 2020-04-13 DIAGNOSIS — S72144A Nondisplaced intertrochanteric fracture of right femur, initial encounter for closed fracture: Principal | ICD-10-CM | POA: Diagnosis present

## 2020-04-13 DIAGNOSIS — M545 Low back pain, unspecified: Secondary | ICD-10-CM | POA: Diagnosis not present

## 2020-04-13 DIAGNOSIS — E079 Disorder of thyroid, unspecified: Secondary | ICD-10-CM | POA: Diagnosis not present

## 2020-04-13 DIAGNOSIS — M2550 Pain in unspecified joint: Secondary | ICD-10-CM | POA: Diagnosis not present

## 2020-04-13 DIAGNOSIS — E46 Unspecified protein-calorie malnutrition: Secondary | ICD-10-CM | POA: Insufficient documentation

## 2020-04-13 DIAGNOSIS — I341 Nonrheumatic mitral (valve) prolapse: Secondary | ICD-10-CM | POA: Diagnosis not present

## 2020-04-13 DIAGNOSIS — W19XXXA Unspecified fall, initial encounter: Secondary | ICD-10-CM | POA: Diagnosis not present

## 2020-04-13 DIAGNOSIS — Z01810 Encounter for preprocedural cardiovascular examination: Secondary | ICD-10-CM | POA: Diagnosis not present

## 2020-04-13 DIAGNOSIS — Z4789 Encounter for other orthopedic aftercare: Secondary | ICD-10-CM | POA: Diagnosis not present

## 2020-04-13 DIAGNOSIS — M25451 Effusion, right hip: Secondary | ICD-10-CM | POA: Diagnosis not present

## 2020-04-13 DIAGNOSIS — R1314 Dysphagia, pharyngoesophageal phase: Secondary | ICD-10-CM | POA: Diagnosis not present

## 2020-04-13 DIAGNOSIS — S72141D Displaced intertrochanteric fracture of right femur, subsequent encounter for closed fracture with routine healing: Secondary | ICD-10-CM | POA: Diagnosis not present

## 2020-04-13 LAB — COMPREHENSIVE METABOLIC PANEL
ALT: 18 U/L (ref 0–44)
AST: 23 U/L (ref 15–41)
Albumin: 3.6 g/dL (ref 3.5–5.0)
Alkaline Phosphatase: 64 U/L (ref 38–126)
Anion gap: 10 (ref 5–15)
BUN: 17 mg/dL (ref 8–23)
CO2: 25 mmol/L (ref 22–32)
Calcium: 8.9 mg/dL (ref 8.9–10.3)
Chloride: 102 mmol/L (ref 98–111)
Creatinine, Ser: 0.58 mg/dL (ref 0.44–1.00)
GFR, Estimated: >60 mL/min (ref >60)
Glucose, Bld: 103 mg/dL — ABNORMAL HIGH (ref 70–99)
Potassium: 4.3 mmol/L (ref 3.5–5.1)
Sodium: 137 mmol/L (ref 135–145)
Total Bilirubin: 1.2 mg/dL (ref 0.3–1.2)
Total Protein: 5.8 g/dL — ABNORMAL LOW (ref 6.5–8.1)

## 2020-04-13 LAB — CBC WITH DIFFERENTIAL/PLATELET
Abs Immature Granulocytes: 0.01 10*3/uL (ref 0.00–0.07)
Basophils Absolute: 0 10*3/uL (ref 0.0–0.1)
Basophils Relative: 1 %
Eosinophils Absolute: 0.1 10*3/uL (ref 0.0–0.5)
Eosinophils Relative: 2 %
HCT: 38.8 % (ref 36.0–46.0)
Hemoglobin: 12.9 g/dL (ref 12.0–15.0)
Immature Granulocytes: 0 %
Lymphocytes Relative: 13 %
Lymphs Abs: 0.8 10*3/uL (ref 0.7–4.0)
MCH: 30.6 pg (ref 26.0–34.0)
MCHC: 33.2 g/dL (ref 30.0–36.0)
MCV: 92.2 fL (ref 80.0–100.0)
Monocytes Absolute: 0.8 10*3/uL (ref 0.1–1.0)
Monocytes Relative: 13 %
Neutro Abs: 4.6 10*3/uL (ref 1.7–7.7)
Neutrophils Relative %: 71 %
Platelets: 101 10*3/uL — ABNORMAL LOW (ref 150–400)
RBC: 4.21 MIL/uL (ref 3.87–5.11)
RDW: 13.3 % (ref 11.5–15.5)
WBC: 6.3 10*3/uL (ref 4.0–10.5)
nRBC: 0 % (ref 0.0–0.2)

## 2020-04-13 LAB — MAGNESIUM: Magnesium: 2.1 mg/dL (ref 1.7–2.4)

## 2020-04-13 MED ORDER — MORPHINE SULFATE (PF) 2 MG/ML IV SOLN
0.5000 mg | INTRAVENOUS | Status: DC | PRN
  Administered 2020-04-15 (×2): 0.5 mg via INTRAVENOUS
  Filled 2020-04-13 (×2): qty 1

## 2020-04-13 MED ORDER — LORAZEPAM 0.5 MG PO TABS
0.5000 mg | ORAL_TABLET | Freq: Every evening | ORAL | Status: DC | PRN
  Administered 2020-04-14 – 2020-04-16 (×3): 0.5 mg via ORAL
  Filled 2020-04-13 (×3): qty 1

## 2020-04-13 MED ORDER — FENTANYL CITRATE (PF) 100 MCG/2ML IJ SOLN
50.0000 ug | Freq: Once | INTRAMUSCULAR | Status: AC
  Administered 2020-04-13: 50 ug via INTRAVENOUS
  Filled 2020-04-13: qty 2

## 2020-04-13 MED ORDER — ATENOLOL 25 MG PO TABS
25.0000 mg | ORAL_TABLET | Freq: Every day | ORAL | Status: DC
  Administered 2020-04-14 – 2020-04-19 (×5): 25 mg via ORAL
  Filled 2020-04-13 (×5): qty 1

## 2020-04-13 MED ORDER — HYDROCODONE-ACETAMINOPHEN 5-325 MG PO TABS
1.0000 | ORAL_TABLET | Freq: Four times a day (QID) | ORAL | Status: DC | PRN
  Administered 2020-04-14 (×3): 2 via ORAL
  Administered 2020-04-14: 1 via ORAL
  Administered 2020-04-15: 2 via ORAL
  Administered 2020-04-15: 1 via ORAL
  Filled 2020-04-13: qty 2
  Filled 2020-04-13: qty 1
  Filled 2020-04-13 (×4): qty 2
  Filled 2020-04-13: qty 1

## 2020-04-13 MED ORDER — ONDANSETRON HCL 4 MG/2ML IJ SOLN
4.0000 mg | Freq: Once | INTRAMUSCULAR | Status: AC
  Administered 2020-04-13: 4 mg via INTRAVENOUS
  Filled 2020-04-13: qty 2

## 2020-04-13 MED ORDER — FENTANYL CITRATE (PF) 100 MCG/2ML IJ SOLN
50.0000 ug | Freq: Once | INTRAMUSCULAR | Status: AC
Start: 2020-04-13 — End: 2020-04-13
  Administered 2020-04-13: 50 ug via INTRAVENOUS
  Filled 2020-04-13: qty 2

## 2020-04-13 NOTE — ED Notes (Signed)
Patient was wearing a KED from EMS. Patient's daughter came into the room and demanded that the KED be removed. KED was taken off. Patient yelled when turned.

## 2020-04-13 NOTE — ED Notes (Signed)
Pt to MRI condition stable

## 2020-04-13 NOTE — Progress Notes (Signed)
Called ER to reach RN about this pt.RN unable to be reached will reattempt. Unable to contact RN via secure chat no RN assigned this pt.

## 2020-04-13 NOTE — ED Notes (Signed)
This RN spoke with MRI, states patient will be next

## 2020-04-13 NOTE — ED Notes (Signed)
Pt. Placed on purewick.  

## 2020-04-13 NOTE — ED Triage Notes (Addendum)
Patient reports that she fell 4 days ago and went to [facility] where she had a scan and x-ray. Patient was discharged home. Patient has increased pain to the lower back and states increased pain when she bends, sits upright, or moves extremities. patient placed in a KED

## 2020-04-13 NOTE — H&P (Addendum)
History and Physical    Ashley Washington JSE:[id number] DOB: 1932/11/09 DOA: 04/13/2020  PCP: Ashley Infante, MD  Patient coming from: Home.  Chief Complaint: Right hip pain.  HPI: Ashley Washington is a 87 y.o. female with history of mitral valve prolapse and fibromuscular dysplasia of renal artery had a fall about 4 days ago and was taken to the ER at that time x-rays and CT scan did not show any acute fractures but since then patient has benign worsening pain in difficulty ambulating was brought into the ER again.  ED Course: MRI of the right hip and MRI lumbar spine was done.  MRI shows nondisplaced intertrochanteric fracture of the right hip and also possible upper body with S2 fracture.  On-call orthopedic surgeon Dr. Rolena Washington was consulted and patient admitted for further management.  Labs show platelets of 101 otherwise unremarkable.  Covid test was negative.  Review of Systems: As per HPI, rest all negative.   Past Medical History:  Diagnosis Date  . Cancer ([place])   . Fibromuscular dysplasia of renal artery ([place])   . Hypertension   . Mitral valve prolapse   . Thyroid disease     Past Surgical History:  Procedure Laterality Date  . ABDOMINAL HYSTERECTOMY    . APPENDECTOMY    . PARATHYROIDECTOMY    . TONSILLECTOMY       reports that she has never smoked. She has never used smokeless tobacco. She reports current alcohol use. She reports that she does not use drugs.  Allergies  Allergen Reactions  . Doxycycline Other (See Comments)    Eye irritation  . Fluorometholone Other (See Comments)    Eye irritation due to preservatives  . Other     Antibiotics - causes GI upset  . Pilocarpine Other (See Comments)    Caused blood pressure to drop so low they almost had to call EMS    Family History  Problem Relation Age of Onset  . Breast cancer Paternal Aunt   . Breast cancer Paternal Aunt   . Colon cancer Neg Hx     Prior to Admission medications   Medication Sig  Start Date End Date Taking? Authorizing Provider  atenolol (TENORMIN) 25 MG tablet 1/2 tab po qd Patient taking differently: Take 25 mg by mouth daily. 06/09/12  Yes Lelon Perla, MD  LORazepam (ATIVAN) 0.5 MG tablet 1/4 of tablet as needed Patient taking differently: Take 0.5 mg by mouth at bedtime as needed for sleep. 06/12/12  Yes Lelon Perla, MD  metroNIDAZOLE (METROGEL) 0.75 % gel Apply 1 application topically 2 (two) times daily.   Yes [provider]  nitroGLYCERIN (NITROSTAT) 0.4 MG SL tablet Place 1 tablet (0.4 mg total) under the tongue every 5 (five) minutes as needed for chest pain. 09/30/18 10/30/18 Yes Kilroy, Doreene Burke, PA-C  Propylene Glycol (SYSTANE COMPLETE) 0.6 % SOLN Apply 1 drop to eye daily as needed (dry eyes).   Yes [provider]  traMADol (ULTRAM) 50 MG tablet Take 50 mg by mouth 3 (three) times daily as needed for moderate pain. 12/08/19  Yes [provider]    Physical Exam: Constitutional: Moderately built and nourished. Vitals:   04/13/20 1711 04/13/20 2013 04/13/20 2030 04/13/20 2209  BP:  (!) 170/77 (!) 160/80 (!) 171/80  Pulse:  71 65 70  Resp:  18 18 18   Temp:      TempSrc:      SpO2:  95% 94% 95%  Weight: 48.9  kg     Height: 5\' 4"  (1.626 m)      Eyes: Anicteric no pallor. ENMT: No discharge from the ears eyes nose or mouth. Neck: No mass felt.  No neck rigidity. Respiratory: No rhonchi or crepitations. Cardiovascular: S1-S2 heard. Abdomen: Soft nontender bowel sounds present. Musculoskeletal: Pain on moving right hip. Skin: No rash. Neurologic: Alert awake oriented to time place and person.  Moves all extremities. Psychiatric: Appears normal.  Normal affect.   Labs on Admission: I have personally reviewed following labs and imaging studies  CBC: Recent Labs  Lab 04/13/20 1946  WBC 6.3  NEUTROABS 4.6  HGB 12.9  HCT 38.8  MCV 92.2  PLT 99991111*   Basic Metabolic Panel: Recent Labs  Lab 04/13/20 1946  NA 137   K 4.3  CL 102  CO2 25  GLUCOSE 103*  BUN 17  CREATININE 0.58  CALCIUM 8.9  MG 2.1   GFR: Estimated Creatinine Clearance: 39.7 mL/min (by C-G formula based on SCr of 0.58 mg/dL). Liver Function Tests: Recent Labs  Lab 04/13/20 1946  AST 23  ALT 18  ALKPHOS 64  BILITOT 1.2  PROT 5.8*  ALBUMIN 3.6   No results for input(s): LIPASE, AMYLASE in the last 168 hours. No results for input(s): AMMONIA in the last 168 hours. Coagulation Profile: No results for input(s): INR, PROTIME in the last 168 hours. Cardiac Enzymes: No results for input(s): CKTOTAL, CKMB, CKMBINDEX, TROPONINI in the last 168 hours. BNP (last 3 results) No results for input(s): PROBNP in the last 8760 hours. HbA1C: No results for input(s): HGBA1C in the last 72 hours. CBG: No results for input(s): GLUCAP in the last 168 hours. Lipid Profile: No results for input(s): CHOL, HDL, LDLCALC, TRIG, CHOLHDL, LDLDIRECT in the last 72 hours. Thyroid Function Tests: No results for input(s): TSH, T4TOTAL, FREET4, T3FREE, THYROIDAB in the last 72 hours. Anemia Panel: No results for input(s): VITAMINB12, FOLATE, FERRITIN, TIBC, IRON, RETICCTPCT in the last 72 hours. Urine analysis: No results found for: COLORURINE, APPEARANCEUR, LABSPEC, PHURINE, GLUCOSEU, HGBUR, BILIRUBINUR, KETONESUR, PROTEINUR, UROBILINOGEN, NITRITE, LEUKOCYTESUR Sepsis Labs: @LABRCNTIP (procalcitonin:4,lacticidven:4) )No results found for this or any previous visit (from the past 240 hour(s)).   Radiological Exams on Admission: MR LUMBAR SPINE WO CONTRAST  Result Date: 04/13/2020 CLINICAL DATA:  Low back pain.  Recent fall. EXAM: MRI LUMBAR SPINE WITHOUT CONTRAST TECHNIQUE: Multiplanar, multisequence MR imaging of the lumbar spine was performed. No intravenous contrast was administered. COMPARISON:  None. FINDINGS: Segmentation:  Standard Alignment: Grade 1 anterolisthesis at L3-4. There is new slight anterolisthesis at S1-2. Vertebrae: Curvilinear  signal abnormality within the upper S2 body could be a minimally displaced fracture. No other acute abnormality. Heterogeneous bone marrow signal in general. Conus medullaris and cauda equina: Conus extends to the L1 level. Conus and cauda equina appear normal. Paraspinal and other soft tissues: Incompletely visualized large left renal cyst appears unchanged. Disc levels: L1-L2: Normal disc space and facet joints. No spinal canal stenosis. No neural foraminal stenosis. L2-L3: Disc space narrowing without herniation. No spinal canal stenosis. No neural foraminal stenosis. L3-L4: Moderate facet hypertrophy with small disc bulge, left asymmetric. Unchanged moderate spinal canal stenosis. Unchanged mild left neural foraminal stenosis. L4-L5: Mild facet hypertrophy. Normal disc. No spinal canal stenosis. No neural foraminal stenosis. L5-S1: Normal disc space and facet joints. No spinal canal stenosis. No neural foraminal stenosis. Visualized sacrum: Normal. IMPRESSION: 1. Possible minimally displaced fracture of the upper S2 body. Consider CT of the sacrum for further evaluation.  2. Unchanged moderate L3-4 spinal canal stenosis. 3. Multilevel facet arthrosis, which may be a source of local low back pain. Electronically Signed   By: Ulyses Jarred M.D.   On: 04/13/2020 22:03   MR HIP RIGHT WO CONTRAST  Result Date: 04/13/2020 CLINICAL DATA:  Negative x-ray, trauma EXAM: MR OF THE RIGHT HIP WITHOUT CONTRAST TECHNIQUE: Multiplanar, multisequence MR imaging was performed. No intravenous contrast was administered. COMPARISON:  None. FINDINGS: Bones: There is a nondisplaced incomplete intratrochanteric fracture extending from the greater trochanter medially. Surrounding marrow edema is noted. The femoral head is normal in shape, configuration and sphericity. No osseous bump at the femoral head neck junction. The visualized bony pelvis appears normal. The visualized sacroiliac joints and symphysis pubis appear normal.  Articular cartilage and labrum Articular cartilage: Mild superior joint space loss and chondral thinning is noted. The ligamentum teres is intact Labrum: There is no gross labral tear or paralabral abnormality. Joint or bursal effusion Joint effusion: A small hip joint effusion is seen. Bursae: No focal periarticular fluid collection. Muscles and tendons Muscles and tendons: There is increased feathery signal seen within the obturator externus and adductor musculature bilaterally. Increased signal seen around the tensor fascia lata and the insertion site of the gluteal tendons. There is mildly increased signal at the hamstrings tendons. There is also increased signal seen within the iliopsoas muscle belly. The piriformis muscles appear symmetric. Other findings Miscellaneous: The visualized internal pelvic contents appear unremarkable. IMPRESSION: Nondisplaced incomplete intratrochanteric fracture extending from the greater trochanter with surrounding marrow edema. Mild to moderate femoroacetabular joint osteoarthritis Diffuse muscular edema surrounding the hip. Insertional gluteal and hamstrings tendinosis Small hip joint effusion Electronically Signed   By: Prudencio Pair M.D.   On: 04/13/2020 21:49     Assessment/Plan Principal Problem:   Closed right hip fracture, initial encounter [facility]) Active Problems:   Mitral valve disorder   Pulmonary HTN ([place])   Hip fracture ([place])    1. Right hip fracture with possible upper S2 fracture for which Dr. Rolena Washington on-call orthopedic surgeon has been consulted requested keeping patient n.p.o. past midnight.  We will also consult cardiology to get preoperative clearance.  Pain medications. 2. History of mitral valve prolapse being followed by cardiologist.  Consult cardiology for preoperative clearance and also has history of pulmonary hypertension per chart.  Presently appears compensated.  Denies any shortness of breath.  Takes atenolol. 3. History of fibromuscular  dysplasia of renal arteries. 4. Thrombocytopenia -follow CBC.  Since patient has hip fracture will need more than 2 midnight stay inpatient status.   DVT prophylaxis: SCDs.  Avoiding anticoagulation in anticipation of procedure. Code Status: Full code. Family Communication: Patient's daughter. Disposition Plan: May need rehab. Consults called: Orthopedic surgery and cardiology. Admission status: Inpatient.   Rise Patience MD [facility] Pager [phone].  If 7PM-7AM, please contact night-coverage [email or web address] Password [facility]  04/13/2020, 11:43 PM

## 2020-04-13 NOTE — ED Provider Notes (Signed)
[facility] DEPT Provider Note   CSN: [id number] Arrival date & time: 04/13/20  1656     History Chief Complaint  Patient presents with  . Back Pain    Ashley Washington is a 86 y.o. female.  The history is provided by the patient and medical records. No language interpreter was used.  Back Pain Location:  Lumbar spine Quality:  Aching and stabbing Radiates to: r hip. Pain severity:  Severe Pain is:  Same all the time Onset quality:  Sudden Duration:  4 days Timing:  Constant Progression:  Waxing and waning Chronicity:  New Context: falling and recent injury   Relieved by:  Being still Worsened by:  Sitting, twisting, touching, standing and bending Associated symptoms: weakness (reported in R leg)   Associated symptoms: no abdominal pain, no bladder incontinence, no bowel incontinence (constipation for several days but had BM today), no chest pain, no dysuria, no fever, no headaches, no leg pain, no numbness and no paresthesias  Tingling:          Past Medical History:  Diagnosis Date  . Cancer ([place])   . Fibromuscular dysplasia of renal artery ([place])   . Hypertension   . Mitral valve prolapse   . Thyroid disease     Patient Active Problem List   Diagnosis Date Noted  . Fibromyalgia 09/28/2018  . Pulmonary HTN ([place]) 09/28/2018  . Edema of both legs 03/17/2014  . Renal fibromuscular dysplasia ([place]) 03/17/2014  . DJD - back 03/17/2014  . Mitral valve disorder 01/05/2009  . Palpitations 01/05/2009  . Chest pain 01/05/2009    Past Surgical History:  Procedure Laterality Date  . ABDOMINAL HYSTERECTOMY    . APPENDECTOMY    . PARATHYROIDECTOMY    . TONSILLECTOMY       OB History   No obstetric history on file.     Family History  Problem Relation Age of Onset  . Breast cancer Paternal Aunt   . Breast cancer Paternal Aunt   . Colon cancer Neg Hx     Social History   Tobacco Use  . Smoking status: Never Smoker  .  Smokeless tobacco: Never Used  Vaping Use  . Vaping Use: Never used  Substance Use Topics  . Alcohol use: Yes    Comment: social  . Drug use: Never    Home Medications Prior to Admission medications   Medication Sig Start Date End Date Taking? Authorizing Provider  atenolol (TENORMIN) 25 MG tablet 1/2 tab po qd Patient taking differently: 25 mg. 1/2 tab po qd 06/09/12   Lelon Perla, MD  Bioflavonoid Products (ESTER-C) 500-200-60 MG TABS Take 1 tablet by mouth daily.    [provider]  Cholecalciferol (VITAMIN D3) 1000 UNITS CAPS Take by mouth.    [provider]  COD LIVER OIL PO Take 1 Dose by mouth daily.    [provider]  Coenzyme Q10 (CO Q 10 PO) Take 50 mg by mouth daily.     [provider]  LORazepam (ATIVAN) 0.5 MG tablet 1/4 of tablet as needed 06/12/12   Lelon Perla, MD  meclizine (ANTIVERT) 25 MG tablet Take 1 tablet by mouth daily as needed. 12/27/14   [provider]  metroNIDAZOLE (METROGEL) 0.75 % gel Apply 1 application topically 2 (two) times daily.    [provider]  MULTIPLE VITAMIN PO Take by mouth.    [provider]  Multiple Vitamins-Minerals (ICAPS LUTEIN & ZEAXANTHIN PO) Take  by mouth.    [provider]  nitroGLYCERIN (NITROSTAT) 0.4 MG SL tablet Place 1 tablet (0.4 mg total) under the tongue every 5 (five) minutes as needed for chest pain. 09/30/18 10/30/18  Erlene Quan, PA-C  QUERCETIN PO Take by mouth.    [provider]  TURMERIC PO Take by mouth.    [provider]  vitamin E 200 UNIT capsule Take 200 Units by mouth daily.    [provider]    Allergies    Doxycycline, Fluorometholone, Other, and Pilocarpine  Review of Systems   Review of Systems  Constitutional: Negative for chills, diaphoresis, fatigue and fever.  HENT: Negative for congestion.   Eyes: Negative for visual disturbance.  Respiratory: Negative for cough and shortness of  breath.   Cardiovascular: Negative for chest pain.  Gastrointestinal: Positive for constipation. Negative for abdominal pain, bowel incontinence (constipation for several days but had BM today), diarrhea, nausea and vomiting.  Genitourinary: Negative for bladder incontinence, dysuria and flank pain.  Musculoskeletal: Positive for back pain. Negative for neck pain and neck stiffness.  Neurological: Positive for weakness (reported in R leg). Negative for light-headedness, numbness, headaches and paresthesias. Tingling:    Psychiatric/Behavioral: Negative for agitation.  All other systems reviewed and are negative.   Physical Exam Updated Vital Signs BP (!) 171/102 (BP Location: Left Arm)   Pulse 63   Temp 98.3 F (36.8 C) (Oral)   Resp 18   Ht 5\' 4"  (1.626 m)   Wt 48.9 kg   SpO2 96%   BMI 18.49 kg/m   Physical Exam Vitals and nursing note reviewed.  Constitutional:      General: She is not in acute distress.    Appearance: She is well-developed and well-nourished. She is not ill-appearing, toxic-appearing or diaphoretic.  HENT:     Head: Normocephalic and atraumatic.     Right Ear: External ear normal.     Left Ear: External ear normal.     Nose: Nose normal.     Mouth/Throat:     Mouth: Oropharynx is clear and moist. Mucous membranes are moist.     Pharynx: No oropharyngeal exudate or posterior oropharyngeal erythema.  Eyes:     Extraocular Movements: EOM normal.     Conjunctiva/sclera: Conjunctivae normal.     Pupils: Pupils are equal, round, and reactive to light.  Cardiovascular:     Rate and Rhythm: Normal rate.     Pulses: Normal pulses.     Heart sounds: No murmur heard.   Pulmonary:     Effort: Pulmonary effort is normal. No respiratory distress.     Breath sounds: No stridor. No wheezing, rhonchi or rales.  Chest:     Chest wall: No tenderness.  Abdominal:     General: Abdomen is flat. There is no distension.     Tenderness: There is no abdominal  tenderness. There is no right CVA tenderness, left CVA tenderness, guarding or rebound.  Musculoskeletal:        General: Tenderness present.     Cervical back: Normal range of motion and neck supple. No tenderness.     Lumbar back: Spasms and tenderness present. Positive right straight leg raise test and positive left straight leg raise test.       Back:     Right hip: Tenderness present.     Right lower leg: No edema.     Left lower leg: No edema.       Legs:  Comments: Tenderness in the lumbar spine area and right paraspinal area with some mild spasm palpated.  Also tender in the right hip area with manipulation and movement.  Positive straight leg raise on both legs but worse on the right and she had right-sided low back pain when the left side was raised.  No numbness or weakness appreciated on my exam but we cannot ambulate patient as we could barely roll her in bed  Skin:    General: Skin is warm.     Coloration: Skin is not pale.     Findings: No erythema or rash.  Neurological:     Mental Status: She is alert. Mental status is at baseline.     Sensory: No sensory deficit.     Motor: No weakness or abnormal muscle tone.     Deep Tendon Reflexes: Reflexes are normal and symmetric.  Psychiatric:        Mood and Affect: Mood normal.     ED Results / Procedures / Treatments   Labs (all labs ordered are listed, but only abnormal results are displayed) Labs Reviewed  CBC WITH DIFFERENTIAL/PLATELET - Abnormal; Notable for the following components:      Result Value   Platelets 101 (*)    All other components within normal limits  COMPREHENSIVE METABOLIC PANEL - Abnormal; Notable for the following components:   Glucose, Bld 103 (*)    Total Protein 5.8 (*)    All other components within normal limits  URINE CULTURE  SARS CORONAVIRUS 2 BY RT PCR (HOSPITAL ORDER, [facility])  MAGNESIUM  URINALYSIS, ROUTINE W REFLEX MICROSCOPIC  CBC  BASIC  METABOLIC PANEL    EKG None  Radiology MR LUMBAR SPINE WO CONTRAST  Result Date: 04/13/2020 CLINICAL DATA:  Low back pain.  Recent fall. EXAM: MRI LUMBAR SPINE WITHOUT CONTRAST TECHNIQUE: Multiplanar, multisequence MR imaging of the lumbar spine was performed. No intravenous contrast was administered. COMPARISON:  None. FINDINGS: Segmentation:  Standard Alignment: Grade 1 anterolisthesis at L3-4. There is new slight anterolisthesis at S1-2. Vertebrae: Curvilinear signal abnormality within the upper S2 body could be a minimally displaced fracture. No other acute abnormality. Heterogeneous bone marrow signal in general. Conus medullaris and cauda equina: Conus extends to the L1 level. Conus and cauda equina appear normal. Paraspinal and other soft tissues: Incompletely visualized large left renal cyst appears unchanged. Disc levels: L1-L2: Normal disc space and facet joints. No spinal canal stenosis. No neural foraminal stenosis. L2-L3: Disc space narrowing without herniation. No spinal canal stenosis. No neural foraminal stenosis. L3-L4: Moderate facet hypertrophy with small disc bulge, left asymmetric. Unchanged moderate spinal canal stenosis. Unchanged mild left neural foraminal stenosis. L4-L5: Mild facet hypertrophy. Normal disc. No spinal canal stenosis. No neural foraminal stenosis. L5-S1: Normal disc space and facet joints. No spinal canal stenosis. No neural foraminal stenosis. Visualized sacrum: Normal. IMPRESSION: 1. Possible minimally displaced fracture of the upper S2 body. Consider CT of the sacrum for further evaluation. 2. Unchanged moderate L3-4 spinal canal stenosis. 3. Multilevel facet arthrosis, which may be a source of local low back pain. Electronically Signed   By: Ulyses Jarred M.D.   On: 04/13/2020 22:03   MR HIP RIGHT WO CONTRAST  Result Date: 04/13/2020 CLINICAL DATA:  Negative x-ray, trauma EXAM: MR OF THE RIGHT HIP WITHOUT CONTRAST TECHNIQUE: Multiplanar, multisequence MR  imaging was performed. No intravenous contrast was administered. COMPARISON:  None. FINDINGS: Bones: There is a nondisplaced incomplete intratrochanteric fracture extending from  the greater trochanter medially. Surrounding marrow edema is noted. The femoral head is normal in shape, configuration and sphericity. No osseous bump at the femoral head neck junction. The visualized bony pelvis appears normal. The visualized sacroiliac joints and symphysis pubis appear normal. Articular cartilage and labrum Articular cartilage: Mild superior joint space loss and chondral thinning is noted. The ligamentum teres is intact Labrum: There is no gross labral tear or paralabral abnormality. Joint or bursal effusion Joint effusion: A small hip joint effusion is seen. Bursae: No focal periarticular fluid collection. Muscles and tendons Muscles and tendons: There is increased feathery signal seen within the obturator externus and adductor musculature bilaterally. Increased signal seen around the tensor fascia lata and the insertion site of the gluteal tendons. There is mildly increased signal at the hamstrings tendons. There is also increased signal seen within the iliopsoas muscle belly. The piriformis muscles appear symmetric. Other findings Miscellaneous: The visualized internal pelvic contents appear unremarkable. IMPRESSION: Nondisplaced incomplete intratrochanteric fracture extending from the greater trochanter with surrounding marrow edema. Mild to moderate femoroacetabular joint osteoarthritis Diffuse muscular edema surrounding the hip. Insertional gluteal and hamstrings tendinosis Small hip joint effusion Electronically Signed   By: Prudencio Pair M.D.   On: 04/13/2020 21:49    Procedures Procedures   Medications Ordered in ED Medications  atenolol (TENORMIN) tablet 25 mg (has no administration in time range)  LORazepam (ATIVAN) tablet 0.5 mg (has no administration in time range)  HYDROcodone-acetaminophen  (NORCO/VICODIN) 5-325 MG per tablet 1-2 tablet (has no administration in time range)  morphine 2 MG/ML injection 0.5 mg (has no administration in time range)  fentaNYL (SUBLIMAZE) injection 50 mcg (50 mcg Intravenous Given 04/13/20 2031)  ondansetron (ZOFRAN) injection 4 mg (4 mg Intravenous Given 04/13/20 2048)  fentaNYL (SUBLIMAZE) injection 50 mcg (50 mcg Intravenous Given 04/13/20 2354)    ED Course  I have reviewed the triage vital signs and the nursing notes.  Pertinent labs & imaging results that were available during my care of the patient were reviewed by me and considered in my medical decision making (see chart for details).    MDM Rules/Calculators/A&P                          Ashley Washington is a 87 y.o. female with a past medical history significant for hypertension, known ovarian cyst, renal fibromuscular dysplasia, degenerative disease in the back with severe spinal stenosis, and recent fall who presents with worsened pain. Patient reports that 4 days ago, she had a fall while rushing to the bathroom landing on her right back. She reports she went to [facility] where she had work-up including x-ray and CT imaging which did not show evidence of acute fracture or dislocation of the spine. She also did not show a hip fracture. She spoke with her PCP and a telehealth visit 2 times over the last few days with continued and worsened symptoms. Pain is up to a 12 out of 10 if she tries to sit up or bend or move. She is unable to walk or stand on her own without assistance which is different than normal. She also had constipation for 4 days which is new for her but did have a bowel movement today. She is having some changes with her urine but denies dysuria. She was given tramadol which has not been helping the pain at home. They told her to come to emergency department for MRI of  the lumbar spine and the right hip to look for occult fracture. Of note, she reports when she has tried to  walk with assistance, she was feeling weak in her right leg. She denies numbness. They report no new falls.  On exam, patient does have tenderness in her lumbar spine. There is also some paraspinal tenderness and some muscle spasms palpated. Lungs were clear and chest was nontender. Abdomen was nontender. Upper back was nontender. She did have some tenderness and pain in her right hip with manipulation. She did not have any weakness or numbness on my exam and could raise her legs symmetrically. She had intact DP and PT pulse. No tenderness in the knee or ankle.  Had a shared decision-making conversation with patient and her daughter. As her PCP was requesting MRI of her lumbar spine and her right hip to look for occult injury given her known severe stenosis with this questionable new weakness in the right leg and some changes in her bowel and bladder control, it is reasonable to get MRI. We did discuss that the CT from several days ago shows a large cyst in her left pelvic area but they know about this and do not want further work-up on that at this time.  We'll give her fentanyl which he tolerated last time and get the imaging. We will also get some screening blood work and urinalysis given the urine changes.  Anticipate reassessment after work-up to determine disposition as family would like to be interested in discussing home health options if the imaging is reassuring.  11:06 PM MRIs returned showing an intratrochanteric nondisplaced fracture in the right hip as well as a possible nondisplaced S2 sacral fracture.  CT was recommended for further characterization of the sacral injury but family does not want her to have to move to get a CT scan again tonight.  As patient is still requiring doses of fentanyl and cannot twist or move in the bed and is having severe screaming pain, do not feel she is a for discharge home.  Will call orthopedics however I suspect family would not want to pursue a surgical  management plan if it was recommended at this time.  They agree with admission to medicine service for PT/OT, orthopedic recommendations, and further monitoring.  Will speak to medicine and orthopedics.  Orthopedics was called and I spoke to Dr. Rolena Infante.  He recommended patient be n.p.o. at midnight and he will have someone from the orthopedics team see her in the morning to discuss possible surgical management.  He thought she would likely need surgery but I informed him that family would like to discuss this first.  Patient will be admitted by hospital service for further management.   Final Clinical Impression(s) / ED Diagnoses Final diagnoses:  Closed fracture of right hip, initial encounter ([place])    Rx / DC Orders ED Discharge Orders    None      Clinical Impression: 1. Closed fracture of right hip, initial encounter [facility])     Disposition: Admit  This note was prepared with assistance of Dragon voice recognition software. Occasional wrong-word or sound-a-like substitutions may have occurred due to the inherent limitations of voice recognition software.     Shanetra Blumenstock, Gwenyth Allegra, MD 04/14/20 [phone]

## 2020-04-14 ENCOUNTER — Inpatient Hospital Stay (HOSPITAL_COMMUNITY): Payer: Medicare Other

## 2020-04-14 DIAGNOSIS — D696 Thrombocytopenia, unspecified: Secondary | ICD-10-CM

## 2020-04-14 DIAGNOSIS — I059 Rheumatic mitral valve disease, unspecified: Secondary | ICD-10-CM | POA: Diagnosis not present

## 2020-04-14 DIAGNOSIS — R0602 Shortness of breath: Secondary | ICD-10-CM

## 2020-04-14 DIAGNOSIS — E440 Moderate protein-calorie malnutrition: Secondary | ICD-10-CM | POA: Insufficient documentation

## 2020-04-14 DIAGNOSIS — E46 Unspecified protein-calorie malnutrition: Secondary | ICD-10-CM | POA: Insufficient documentation

## 2020-04-14 DIAGNOSIS — Z01810 Encounter for preprocedural cardiovascular examination: Secondary | ICD-10-CM | POA: Diagnosis not present

## 2020-04-14 DIAGNOSIS — S72001A Fracture of unspecified part of neck of right femur, initial encounter for closed fracture: Secondary | ICD-10-CM

## 2020-04-14 DIAGNOSIS — I272 Pulmonary hypertension, unspecified: Secondary | ICD-10-CM

## 2020-04-14 LAB — BASIC METABOLIC PANEL
Anion gap: 9 (ref 5–15)
BUN: 16 mg/dL (ref 8–23)
CO2: 25 mmol/L (ref 22–32)
Calcium: 9.1 mg/dL (ref 8.9–10.3)
Chloride: 104 mmol/L (ref 98–111)
Creatinine, Ser: 0.66 mg/dL (ref 0.44–1.00)
GFR, Estimated: >60 mL/min (ref >60)
Glucose, Bld: 106 mg/dL — ABNORMAL HIGH (ref 70–99)
Potassium: 4.3 mmol/L (ref 3.5–5.1)
Sodium: 138 mmol/L (ref 135–145)

## 2020-04-14 LAB — URINALYSIS, ROUTINE W REFLEX MICROSCOPIC
Bilirubin Urine: NEGATIVE
Glucose, UA: NEGATIVE mg/dL
Hgb urine dipstick: NEGATIVE
Ketones, ur: 20 mg/dL — AB
Leukocytes,Ua: NEGATIVE
Nitrite: NEGATIVE
Protein, ur: NEGATIVE mg/dL
Specific Gravity, Urine: 1.01 (ref 1.005–1.030)
pH: 6 (ref 5.0–8.0)

## 2020-04-14 LAB — CBC
HCT: 37.9 % (ref 36.0–46.0)
Hemoglobin: 12.4 g/dL (ref 12.0–15.0)
MCH: 30.2 pg (ref 26.0–34.0)
MCHC: 32.7 g/dL (ref 30.0–36.0)
MCV: 92.2 fL (ref 80.0–100.0)
Platelets: 108 10*3/uL — ABNORMAL LOW (ref 150–400)
RBC: 4.11 MIL/uL (ref 3.87–5.11)
RDW: 13.3 % (ref 11.5–15.5)
WBC: 4.5 10*3/uL (ref 4.0–10.5)
nRBC: 0 % (ref 0.0–0.2)

## 2020-04-14 LAB — ECHOCARDIOGRAM COMPLETE
AR max vel: 2.03 cm2
AV Area VTI: 1.9 cm2
AV Area mean vel: 2.05 cm2
AV Mean grad: 6 mmHg
AV Peak grad: 14 mmHg
Ao pk vel: 1.87 m/s
Area-P 1/2: 2.54 cm2
Calc EF: 68.5 %
Height: 64 in
S' Lateral: 1.2 cm
Single Plane A2C EF: 75.6 %
Single Plane A4C EF: 68.1 %
Weight: 1788.37 oz

## 2020-04-14 LAB — SARS CORONAVIRUS 2 BY RT PCR (HOSPITAL ORDER, PERFORMED IN ~~LOC~~ HOSPITAL LAB): SARS Coronavirus 2: NEGATIVE

## 2020-04-14 LAB — SURGICAL PCR SCREEN
MRSA, PCR: NEGATIVE
Staphylococcus aureus: NEGATIVE

## 2020-04-14 MED ORDER — SODIUM CHLORIDE 0.9 % IV SOLN: INTRAVENOUS | Status: DC

## 2020-04-14 MED ORDER — ADULT MULTIVITAMIN W/MINERALS CH
1.0000 | ORAL_TABLET | Freq: Every day | ORAL | Status: DC
  Administered 2020-04-16 – 2020-04-18 (×3): 1 via ORAL
  Filled 2020-04-14 (×4): qty 1

## 2020-04-14 MED ORDER — CHLORHEXIDINE GLUCONATE 4 % EX LIQD
60.0000 mL | Freq: Once | CUTANEOUS | Status: AC
  Administered 2020-04-15: 4 via TOPICAL

## 2020-04-14 MED ORDER — KATE FARMS STANDARD 1.4 PO LIQD
325.0000 mL | Freq: Two times a day (BID) | ORAL | Status: DC
  Administered 2020-04-14 – 2020-04-19 (×10): 325 mL via ORAL
  Filled 2020-04-14 (×11): qty 325

## 2020-04-14 MED ORDER — CEFAZOLIN SODIUM-DEXTROSE 2-4 GM/100ML-% IV SOLN
2.0000 g | INTRAVENOUS | Status: DC
  Administered 2020-04-15: 2 g via INTRAVENOUS
  Filled 2020-04-14 (×2): qty 100

## 2020-04-14 MED ORDER — RESOURCE INSTANT PROTEIN PO PWD PACKET
1.0000 | Freq: Three times a day (TID) | ORAL | Status: DC
  Administered 2020-04-14 – 2020-04-19 (×13): 6 g via ORAL
  Filled 2020-04-14 (×16): qty 6

## 2020-04-14 MED ORDER — POVIDONE-IODINE 10 % EX SWAB: 2.0000 "application " | Freq: Once | CUTANEOUS | Status: DC

## 2020-04-14 MED ORDER — TRANEXAMIC ACID-NACL 1000-0.7 MG/100ML-% IV SOLN: 1000.0000 mg | INTRAVENOUS | Status: DC

## 2020-04-14 NOTE — ED Notes (Signed)
Daughter would like wound on right shoulder looked at by MD

## 2020-04-14 NOTE — Plan of Care (Signed)
  Problem: Education: Goal: Knowledge of General Education information will improve Description Including pain rating scale, medication(s)/side effects and non-pharmacologic comfort measures Outcome: Progressing   Problem: Clinical Measurements: Goal: Will remain free from infection Outcome: Progressing   Problem: Nutrition: Goal: Adequate nutrition will be maintained Outcome: Progressing   Problem: Elimination: Goal: Will not experience complications related to urinary retention Outcome: Progressing   

## 2020-04-14 NOTE — Progress Notes (Signed)
  Echocardiogram 2D Echocardiogram has been performed.  Ashley Washington 04/14/2020, 2:05 PM

## 2020-04-14 NOTE — Progress Notes (Signed)
Initial Nutrition Assessment  DOCUMENTATION CODES:   Non-severe (moderate) malnutrition in context of acute illness/injury  INTERVENTION:   Once diet is able to be advanced: -Recommend Gluten-free diet (per pt request) -Recommend [employer] 1.4 po BID, each supplement provides 455 kcal and 20 grams protein (vanilla) -Beneprotein powder TID with meals, each provides 25 kcals and 6g protein  NUTRITION DIAGNOSIS:   Moderate Malnutrition related to acute illness,hip fracture as evidenced by energy intake < 75% for > 7 days,moderate fat depletion,moderate muscle depletion.  GOAL:   Patient will meet greater than or equal to 90% of their needs  MONITOR:   Diet advancement,Labs,Weight trends,I & O's  REASON FOR ASSESSMENT:   Consult Hip fracture protocol  ASSESSMENT:   87 y.o. female with history of mitral valve prolapse and fibromuscular dysplasia of renal artery presented to hospital after sustaining a fall 4 days back.  He was then taken to the ER in the chest x-ray CT scan did not show any acute fracture so was discharged home.  But she continued to have worsening pain and difficulty ambulating.  In the ED MRI of the right hip and lumbar spine was done which showed nondisplaced intertrochanteric fracture of the right hip and possible fracture of the S2 vertebra.  Patient in room with daughter at bedside. Pt HOH but was able to hear RD over her mask. Pt currently NPO, still pending when procedure will be pursued for right hip fracture.  Per pt, she follows a gluten-free, plant based diet. Does eat some lean meats such as fish and chicken. Pt is agreeable to trying protein supplements such as [employer] and Beneprotein powders.  Pt did not eat that well over the past week since her fall. Started to eat better over the last few days but not to her normal degree.   Per weight records, weights are stable.  Labs reviewed. Medications reviewed.  NUTRITION - FOCUSED PHYSICAL  EXAM:  Flowsheet Row Most Recent Value  Orbital Region Moderate depletion  Upper Arm Region Moderate depletion  Thoracic and Lumbar Region Unable to assess  Buccal Region Moderate depletion  Temple Region Moderate depletion  Clavicle Bone Region Moderate depletion  Clavicle and Acromion Bone Region Mild depletion  Scapular Bone Region Mild depletion  Dorsal Hand Moderate depletion  Patellar Region Unable to assess  [c/o pain]  Anterior Thigh Region Unable to assess  Posterior Calf Region Unable to assess  Edema (RD Assessment) None       Diet Order:   Diet Order            Diet NPO time specified  Diet effective midnight                 EDUCATION NEEDS:   Education needs have been addressed  Skin:  Skin Assessment: Skin Integrity Issues: Skin Integrity Issues:: Other (Comment) Other: skin tear on right shoulder  Last BM:  2/3  Height:   Ht Readings from Last 1 Encounters:  04/13/20 5\' 4"  (1.626 m)    Weight:   Wt Readings from Last 1 Encounters:  04/14/20 50.7 kg   BMI:  Body mass index is 19.19 kg/m.  Estimated Nutritional Needs:   Kcal:  1350-1550  Protein:  65-80g  Fluid:  1.6L/day   Clayton Bibles, MS, RD, LDN Inpatient Clinical Dietitian Contact information available via Amion

## 2020-04-14 NOTE — ED Notes (Signed)
ED TO INPATIENT HANDOFF REPORT  ED Nurse Name and Phone #: [place] [phone]  S Name/Age/Gender Ashley Washington 87 y.o. female Room/Bed: WA15/WA15  Code Status   Code Status: Full Code  Home/SNF/Other Home Patient oriented to: self, place, time and situation Is this baseline? Yes   Triage Complete: Triage complete  Chief Complaint Closed right hip fracture, initial encounter ([place]) [S72.001A] Hip fracture ([place]) [S72.009A]  Triage Note Patient reports that she fell 4 days ago and went to [facility] where she had a scan and x-ray. Patient was discharged home. Patient has increased pain to the lower back and states increased pain when she bends, sits upright, or moves extremities. patient placed in a KED    Allergies Allergies  Allergen Reactions  . Doxycycline Other (See Comments)    Eye irritation  . Fluorometholone Other (See Comments)    Eye irritation due to preservatives  . Other     Antibiotics - causes GI upset  . Pilocarpine Other (See Comments)    Caused blood pressure to drop so low they almost had to call EMS    Level of Care/Admitting Diagnosis ED Disposition    ED Disposition Condition [place]: [place] [[id number]]  Level of Care: Med-Surg [16]  May admit patient to [facility] or [facility] if equivalent level of care is available:: No  Covid Evaluation: Asymptomatic Screening Protocol (No Symptoms)  [facility]: [id number]  Admitting Physician: Rise Patience [phone]  Attending Physician: Rise Patience [phone]  Estimated length of stay: past midnight tomorrow  Certification:: I certify this patient will need inpatient services for at least 2 midnights       B Medical/Surgery History Past Medical History:  Diagnosis Date  . Cancer ([place])   . Fibromuscular dysplasia of renal artery ([place])   . Hypertension   . Mitral valve prolapse   . Thyroid disease    Past Surgical History:   Procedure Laterality Date  . ABDOMINAL HYSTERECTOMY    . APPENDECTOMY    . PARATHYROIDECTOMY    . TONSILLECTOMY       A IV Location/Drains/Wounds Patient Lines/Drains/Airways Status    Active Line/Drains/Airways    Name Placement date Placement time Site Days   Peripheral IV 04/13/20 Distal;Left;Posterior Forearm 04/13/20  2031  Forearm  1          Intake/Output Last 24 hours No intake or output data in the 24 hours ending 04/14/20 0232  Labs/Imaging Results for orders placed or performed during the hospital encounter of 04/13/20 (from the past 48 hour(s))  SARS Coronavirus 2 by RT PCR (hospital order, performed in [facility] hospital lab) Nasopharyngeal Nasopharyngeal Swab     Status: None   Collection Time: 04/13/20 12:52 AM   Specimen: Nasopharyngeal Swab  Result Value Ref Range   SARS Coronavirus 2 NEGATIVE NEGATIVE    Comment: (NOTE) SARS-CoV-2 target nucleic acids are NOT DETECTED.  The SARS-CoV-2 RNA is generally detectable in upper and lower respiratory specimens during the acute phase of infection. The lowest concentration of SARS-CoV-2 viral copies this assay can detect is 250 copies / mL. A negative result does not preclude SARS-CoV-2 infection and should not be used as the sole basis for treatment or other patient management decisions.  A negative result may occur with improper specimen collection / handling, submission of specimen other than nasopharyngeal swab, presence of viral mutation(s) within the areas targeted by this assay, and inadequate  number of viral copies (<250 copies / mL). A negative result must be combined with clinical observations, patient history, and epidemiological information.  Fact Sheet for Patients:   StrictlyIdeas.no  Fact Sheet for Healthcare Providers: BankingDealers.co.za  This test is not yet approved or  cleared by the [place] FDA and has been authorized for detection  and/or diagnosis of SARS-CoV-2 by FDA under an Emergency Use Authorization (EUA).  This EUA will remain in effect (meaning this test can be used) for the duration of the COVID-19 declaration under Section 564(b)(1) of the Act, 21 U.S.C. section 360bbb-3(b)(1), unless the authorization is terminated or revoked sooner.  Performed at [facility], [place] [address] [place], [place] [address]   CBC with Differential     Status: Abnormal   Collection Time: 04/13/20  7:46 PM  Result Value Ref Range   WBC 6.3 4.0 - 10.5 K/uL   RBC 4.21 3.87 - 5.11 MIL/uL   Hemoglobin 12.9 12.0 - 15.0 g/dL   HCT 38.8 36.0 - 46.0 %   MCV 92.2 80.0 - 100.0 fL   MCH 30.6 26.0 - 34.0 pg   MCHC 33.2 30.0 - 36.0 g/dL   RDW 13.3 11.5 - 15.5 %   Platelets 101 (L) 150 - 400 K/uL    Comment: REPEATED TO VERIFY PLATELET COUNT CONFIRMED BY SMEAR SPECIMEN CHECKED FOR CLOTS    nRBC 0.0 0.0 - 0.2 %   Neutrophils Relative % 71 %   Neutro Abs 4.6 1.7 - 7.7 K/uL   Lymphocytes Relative 13 %   Lymphs Abs 0.8 0.7 - 4.0 K/uL   Monocytes Relative 13 %   Monocytes Absolute 0.8 0.1 - 1.0 K/uL   Eosinophils Relative 2 %   Eosinophils Absolute 0.1 0.0 - 0.5 K/uL   Basophils Relative 1 %   Basophils Absolute 0.0 0.0 - 0.1 K/uL   Immature Granulocytes 0 %   Abs Immature Granulocytes 0.01 0.00 - 0.07 K/uL    Comment: Performed at [facility], [place] [address] [place], [place] [address]  Comprehensive metabolic panel     Status: Abnormal   Collection Time: 04/13/20  7:46 PM  Result Value Ref Range   Sodium 137 135 - 145 mmol/L   Potassium 4.3 3.5 - 5.1 mmol/L   Chloride 102 98 - 111 mmol/L   CO2 25 22 - 32 mmol/L   Glucose, Bld 103 (H) 70 - 99 mg/dL    Comment: Glucose reference range applies only to samples taken after fasting for at least 8 hours.   BUN 17 8 - 23 mg/dL   Creatinine, Ser 0.58 0.44 - 1.00 mg/dL   Calcium 8.9 8.9 - 10.3 mg/dL   Total Protein 5.8 (L) 6.5 - 8.1 g/dL    Albumin 3.6 3.5 - 5.0 g/dL   AST 23 15 - 41 U/L   ALT 18 0 - 44 U/L   Alkaline Phosphatase 64 38 - 126 U/L   Total Bilirubin 1.2 0.3 - 1.2 mg/dL   GFR, Estimated >60 >60 mL/min    Comment: (NOTE) Calculated using the CKD-EPI Creatinine Equation (2021)    Anion gap 10 5 - 15    Comment: Performed at [facility], [place] [address] [place], [place] [address]  Magnesium     Status: None   Collection Time: 04/13/20  7:46 PM  Result Value Ref Range   Magnesium 2.1 1.7 - 2.4 mg/dL    Comment: Performed at [facility],  [place] [address] [place], [place] [address]   MR LUMBAR SPINE WO CONTRAST  Result Date: 04/13/2020 CLINICAL DATA:  Low back pain.  Recent fall. EXAM: MRI LUMBAR SPINE WITHOUT CONTRAST TECHNIQUE: Multiplanar, multisequence MR imaging of the lumbar spine was performed. No intravenous contrast was administered. COMPARISON:  None. FINDINGS: Segmentation:  Standard Alignment: Grade 1 anterolisthesis at L3-4. There is new slight anterolisthesis at S1-2. Vertebrae: Curvilinear signal abnormality within the upper S2 body could be a minimally displaced fracture. No other acute abnormality. Heterogeneous bone marrow signal in general. Conus medullaris and cauda equina: Conus extends to the L1 level. Conus and cauda equina appear normal. Paraspinal and other soft tissues: Incompletely visualized large left renal cyst appears unchanged. Disc levels: L1-L2: Normal disc space and facet joints. No spinal canal stenosis. No neural foraminal stenosis. L2-L3: Disc space narrowing without herniation. No spinal canal stenosis. No neural foraminal stenosis. L3-L4: Moderate facet hypertrophy with small disc bulge, left asymmetric. Unchanged moderate spinal canal stenosis. Unchanged mild left neural foraminal stenosis. L4-L5: Mild facet hypertrophy. Normal disc. No spinal canal stenosis. No neural foraminal stenosis. L5-S1: Normal disc space and facet joints. No spinal canal  stenosis. No neural foraminal stenosis. Visualized sacrum: Normal. IMPRESSION: 1. Possible minimally displaced fracture of the upper S2 body. Consider CT of the sacrum for further evaluation. 2. Unchanged moderate L3-4 spinal canal stenosis. 3. Multilevel facet arthrosis, which may be a source of local low back pain. Electronically Signed   By: Ulyses Jarred M.D.   On: 04/13/2020 22:03   MR HIP RIGHT WO CONTRAST  Result Date: 04/13/2020 CLINICAL DATA:  Negative x-ray, trauma EXAM: MR OF THE RIGHT HIP WITHOUT CONTRAST TECHNIQUE: Multiplanar, multisequence MR imaging was performed. No intravenous contrast was administered. COMPARISON:  None. FINDINGS: Bones: There is a nondisplaced incomplete intratrochanteric fracture extending from the greater trochanter medially. Surrounding marrow edema is noted. The femoral head is normal in shape, configuration and sphericity. No osseous bump at the femoral head neck junction. The visualized bony pelvis appears normal. The visualized sacroiliac joints and symphysis pubis appear normal. Articular cartilage and labrum Articular cartilage: Mild superior joint space loss and chondral thinning is noted. The ligamentum teres is intact Labrum: There is no gross labral tear or paralabral abnormality. Joint or bursal effusion Joint effusion: A small hip joint effusion is seen. Bursae: No focal periarticular fluid collection. Muscles and tendons Muscles and tendons: There is increased feathery signal seen within the obturator externus and adductor musculature bilaterally. Increased signal seen around the tensor fascia lata and the insertion site of the gluteal tendons. There is mildly increased signal at the hamstrings tendons. There is also increased signal seen within the iliopsoas muscle belly. The piriformis muscles appear symmetric. Other findings Miscellaneous: The visualized internal pelvic contents appear unremarkable. IMPRESSION: Nondisplaced incomplete intratrochanteric  fracture extending from the greater trochanter with surrounding marrow edema. Mild to moderate femoroacetabular joint osteoarthritis Diffuse muscular edema surrounding the hip. Insertional gluteal and hamstrings tendinosis Small hip joint effusion Electronically Signed   By: Prudencio Pair M.D.   On: 04/13/2020 21:49    Pending Labs Unresulted Labs (From admission, onward)          Start     Ordered   04/14/20 0500  CBC  Tomorrow morning,   R        04/13/20 2342   04/14/20 0932  Basic metabolic panel  Tomorrow morning,   R        04/13/20 2342   04/13/20  1849  Urinalysis, Routine w reflex microscopic  Once,   STAT        04/13/20 1848   04/13/20 1849  Urine culture  ONCE - STAT,   STAT        04/13/20 1848          Vitals/Pain Today's Vitals   04/14/20 0100 04/14/20 0130 04/14/20 0200 04/14/20 0227  BP: (!) 165/71 (!) 154/73 (!) 159/90   Pulse: 63 (!) 57 69   Resp: 18 16 18    Temp:    (!) 96.9 F (36.1 C)  TempSrc:    Oral  SpO2: 93% 93% 93%   Weight:      Height:      PainSc:        Isolation Precautions No active isolations  Medications Medications  atenolol (TENORMIN) tablet 25 mg (has no administration in time range)  LORazepam (ATIVAN) tablet 0.5 mg (has no administration in time range)  HYDROcodone-acetaminophen (NORCO/VICODIN) 5-325 MG per tablet 1-2 tablet (has no administration in time range)  morphine 2 MG/ML injection 0.5 mg (has no administration in time range)  fentaNYL (SUBLIMAZE) injection 50 mcg (50 mcg Intravenous Given 04/13/20 2031)  ondansetron (ZOFRAN) injection 4 mg (4 mg Intravenous Given 04/13/20 2048)  fentaNYL (SUBLIMAZE) injection 50 mcg (50 mcg Intravenous Given 04/13/20 2354)    Mobility non-ambulatory Moderate fall risk   Focused Assessments Gen Med   R Recommendations: See Admitting Provider Note  Report given to:   Additional Notes:

## 2020-04-14 NOTE — Progress Notes (Signed)
PROGRESS NOTE  Ashley Washington ZHY:[id number] DOB: 31-Jan-1933 DOA: 04/13/2020 PCP: Crist Infante, MD   LOS: 1 day   Brief narrative:  Ashley Washington is a 87 y.o. female with history of mitral valve prolapse and fibromuscular dysplasia of renal artery presented to hospital after sustaining a fall 4 days back.  He was then taken to the ER in the chest x-ray CT scan did not show any acute fracture so was discharged home.  But she continued to have worsening pain and difficulty ambulating.  In the ED MRI of the right hip and lumbar spine was done which showed nondisplaced intertrochanteric fracture of the right hip and possible fracture of the S2 vertebra.    On-call orthopedic surgeon Dr. Rolena Infante was consulted and patient admitted for further management.  Labs show platelets of 101 otherwise unremarkable.  Covid test was negative.  Patient was then admitted to the hospital for further evaluation.   Assessment/Plan:  Principal Problem:   Closed right hip fracture, initial encounter [facility]) Active Problems:   Mitral valve disorder   Pulmonary HTN (HCC)   Hip fracture (HCC)   Right hip fracture with possible upper S2 fracture.  Evident on MRI of the hip.  Orthopedics Dr. Rolena Infante been notified.  Currently n.p.o.  Cardiology was consulted for preop clearance.  Focus on analgesia.  On hydrocodone and morphine.  UA negative.  Covid test was negative.  Electrolytes within normal limits  History of mitral valve prolapse being followed by cardiologist.    History of pulmonary hypertension.  Presently appears compensated.    Continue atenolol.  Cardiology been consulted.  History of fibromuscular dysplasia of renal arteries.  Thrombocytopenia -mild follow CBC.  More than 100.  DVT prophylaxis: SCDs Start: 04/13/20 2342   Code Status: Full code  Family Communication: Spoke with the patient's daughter at bedside  Status is: Inpatient  Remains inpatient appropriate because:IV treatments  appropriate due to intensity of illness or inability to take PO, Inpatient level of care appropriate due to severity of illness and Hip fracture requiring surgery   Dispo: The patient is from: Home              Anticipated d/c is to: Undetermined at this time.              Anticipated d/c date is: 2 days              Patient currently is not medically stable to d/c.   Difficult to place patient No  Consultants:  Orthopedics  Cardiology  Procedures:  None  Anti-infectives:  . None  Anti-infectives (From admission, onward)   None     Subjective: Today, patient was seen and examined at bedside.  Patient denied any chest pain, shortness of breath, dizziness, lightheadedness.  Complains of hip pain on moving  Objective: Vitals:   04/14/20 0425 04/14/20 0922  BP: (!) 150/62 125/69  Pulse: 66 60  Resp: 16 17  Temp: 98.1 F (36.7 C) 98.6 F (37 C)  SpO2: 93% 93%    Intake/Output Summary (Last 24 hours) at 04/14/2020 1016 Last data filed at 04/14/2020 0800 Gross per 24 hour  Intake --  Output 1100 ml  Net -1100 ml   Filed Weights   04/13/20 1711 04/14/20 0411  Weight: 48.9 kg 50.7 kg   Body mass index is 19.19 kg/m.   Physical Exam:  GENERAL: Patient is alert awake and oriented. Not in obvious distress.  Thinly built HENT: No scleral pallor or  icterus. Pupils equally reactive to light. Oral mucosa is moist NECK: is supple, no gross swelling noted. CHEST: Clear to auscultation. No crackles or wheezes.  Diminished breath sounds bilaterally. CVS: S1 and S2 heard, no murmur. Regular rate and rhythm.  ABDOMEN: Soft, non-tender, bowel sounds are present. EXTREMITIES: No edema.  Right hip tenderness on palpation. CNS: Cranial nerves are intact. No focal motor deficits. SKIN: warm and dry without rashes.  Data Review: I have personally reviewed the following laboratory data and studies,  CBC: Recent Labs  Lab 04/13/20 1946 04/14/20 0550  WBC 6.3 4.5  NEUTROABS  4.6  --   HGB 12.9 12.4  HCT 38.8 37.9  MCV 92.2 92.2  PLT 101* 062*   Basic Metabolic Panel: Recent Labs  Lab 04/13/20 1946 04/14/20 0550  NA 137 138  K 4.3 4.3  CL 102 104  CO2 25 25  GLUCOSE 103* 106*  BUN 17 16  CREATININE 0.58 0.66  CALCIUM 8.9 9.1  MG 2.1  --    Liver Function Tests: Recent Labs  Lab 04/13/20 1946  AST 23  ALT 18  ALKPHOS 64  BILITOT 1.2  PROT 5.8*  ALBUMIN 3.6   No results for input(s): LIPASE, AMYLASE in the last 168 hours. No results for input(s): AMMONIA in the last 168 hours. Cardiac Enzymes: No results for input(s): CKTOTAL, CKMB, CKMBINDEX, TROPONINI in the last 168 hours. BNP (last 3 results) No results for input(s): BNP in the last 8760 hours.  ProBNP (last 3 results) No results for input(s): PROBNP in the last 8760 hours.  CBG: No results for input(s): GLUCAP in the last 168 hours. Recent Results (from the past 240 hour(s))  SARS Coronavirus 2 by RT PCR (hospital order, performed in [facility] hospital lab) Nasopharyngeal Nasopharyngeal Swab     Status: None   Collection Time: 04/13/20 12:52 AM   Specimen: Nasopharyngeal Swab  Result Value Ref Range Status   SARS Coronavirus 2 NEGATIVE NEGATIVE Final    Comment: (NOTE) SARS-CoV-2 target nucleic acids are NOT DETECTED.  The SARS-CoV-2 RNA is generally detectable in upper and lower respiratory specimens during the acute phase of infection. The lowest concentration of SARS-CoV-2 viral copies this assay can detect is 250 copies / mL. A negative result does not preclude SARS-CoV-2 infection and should not be used as the sole basis for treatment or other patient management decisions.  A negative result may occur with improper specimen collection / handling, submission of specimen other than nasopharyngeal swab, presence of viral mutation(s) within the areas targeted by this assay, and inadequate number of viral copies (<250 copies / mL). A negative result must be combined with  clinical observations, patient history, and epidemiological information.  Fact Sheet for Patients:   StrictlyIdeas.no  Fact Sheet for Healthcare Providers: BankingDealers.co.za  This test is not yet approved or  cleared by the [place] FDA and has been authorized for detection and/or diagnosis of SARS-CoV-2 by FDA under an Emergency Use Authorization (EUA).  This EUA will remain in effect (meaning this test can be used) for the duration of the COVID-19 declaration under Section 564(b)(1) of the Act, 21 U.S.C. section 360bbb-3(b)(1), unless the authorization is terminated or revoked sooner.  Performed at [facility], [place] [address] [place], [place] [address]      Studies: MR LUMBAR SPINE WO CONTRAST  Result Date: 04/13/2020 CLINICAL DATA:  Low back pain.  Recent fall. EXAM: MRI LUMBAR SPINE WITHOUT CONTRAST TECHNIQUE: Multiplanar, multisequence MR imaging  of the lumbar spine was performed. No intravenous contrast was administered. COMPARISON:  None. FINDINGS: Segmentation:  Standard Alignment: Grade 1 anterolisthesis at L3-4. There is new slight anterolisthesis at S1-2. Vertebrae: Curvilinear signal abnormality within the upper S2 body could be a minimally displaced fracture. No other acute abnormality. Heterogeneous bone marrow signal in general. Conus medullaris and cauda equina: Conus extends to the L1 level. Conus and cauda equina appear normal. Paraspinal and other soft tissues: Incompletely visualized large left renal cyst appears unchanged. Disc levels: L1-L2: Normal disc space and facet joints. No spinal canal stenosis. No neural foraminal stenosis. L2-L3: Disc space narrowing without herniation. No spinal canal stenosis. No neural foraminal stenosis. L3-L4: Moderate facet hypertrophy with small disc bulge, left asymmetric. Unchanged moderate spinal canal stenosis. Unchanged mild left neural foraminal stenosis.  L4-L5: Mild facet hypertrophy. Normal disc. No spinal canal stenosis. No neural foraminal stenosis. L5-S1: Normal disc space and facet joints. No spinal canal stenosis. No neural foraminal stenosis. Visualized sacrum: Normal. IMPRESSION: 1. Possible minimally displaced fracture of the upper S2 body. Consider CT of the sacrum for further evaluation. 2. Unchanged moderate L3-4 spinal canal stenosis. 3. Multilevel facet arthrosis, which may be a source of local low back pain. Electronically Signed   By: Ulyses Jarred M.D.   On: 04/13/2020 22:03   MR HIP RIGHT WO CONTRAST  Result Date: 04/13/2020 CLINICAL DATA:  Negative x-ray, trauma EXAM: MR OF THE RIGHT HIP WITHOUT CONTRAST TECHNIQUE: Multiplanar, multisequence MR imaging was performed. No intravenous contrast was administered. COMPARISON:  None. FINDINGS: Bones: There is a nondisplaced incomplete intratrochanteric fracture extending from the greater trochanter medially. Surrounding marrow edema is noted. The femoral head is normal in shape, configuration and sphericity. No osseous bump at the femoral head neck junction. The visualized bony pelvis appears normal. The visualized sacroiliac joints and symphysis pubis appear normal. Articular cartilage and labrum Articular cartilage: Mild superior joint space loss and chondral thinning is noted. The ligamentum teres is intact Labrum: There is no gross labral tear or paralabral abnormality. Joint or bursal effusion Joint effusion: A small hip joint effusion is seen. Bursae: No focal periarticular fluid collection. Muscles and tendons Muscles and tendons: There is increased feathery signal seen within the obturator externus and adductor musculature bilaterally. Increased signal seen around the tensor fascia lata and the insertion site of the gluteal tendons. There is mildly increased signal at the hamstrings tendons. There is also increased signal seen within the iliopsoas muscle belly. The piriformis muscles appear  symmetric. Other findings Miscellaneous: The visualized internal pelvic contents appear unremarkable. IMPRESSION: Nondisplaced incomplete intratrochanteric fracture extending from the greater trochanter with surrounding marrow edema. Mild to moderate femoroacetabular joint osteoarthritis Diffuse muscular edema surrounding the hip. Insertional gluteal and hamstrings tendinosis Small hip joint effusion Electronically Signed   By: Prudencio Pair M.D.   On: 04/13/2020 21:49      Flora Lipps, MD  [facility] 04/14/2020  If 7PM-7AM, please contact night-coverage

## 2020-04-14 NOTE — TOC Initial Note (Signed)
Transition of Care [facility]) - Initial/Assessment Note    Patient Details  Name: Ashley Washington MRN: [id number] Date of Birth: 1932-05-20  Transition of [facility] CM/SW Contact:    Lennart Pall, LCSW Phone Number: 04/14/2020, 2:39 PM  Clinical Narrative:                 Met with pt and daughter, Hinton Dyer, this afternoon to introduce self/ role and begin discussion on dc planning needs.  Both anticipating surgery for her hip fracture within the day possibly.  We discussed potential dc plans including home with [facility], hiring of private caregivers if needed and short term SNF.  Reviewed insurance coverage for all options.   Both hopeful that pt will be able to dc home with [facility].  Report that pt lives with her 28 yo spouse who remains very active and can provide some physical assistance, however, he does have prostate CA with bone mets.  Two adult children live out of state ([place] and [place]) and have been helping the last few days in the home but cannot continue to do this on a long term basis.  Explained that TOC will follow and monitor pt's progress with therapies following her surgery and assist with whichever dc plan is decided on.    Expected Discharge Plan: [place] Barriers to Discharge: Continued Medical Work up   Patient Goals and CMS Choice        Expected Discharge Plan and Services Expected Discharge Plan: [place] In-house Referral: Clinical Social Work     Living arrangements for the past 2 months: [place]                                      Prior Living Arrangements/Services Living arrangements for the past 2 months: Single Family Home Lives with:: Spouse Patient language and need for interpreter reviewed:: Yes Do you feel safe going back to the place where you live?: Yes      Need for Family Participation in Patient Care: Yes (Comment) Care giver support system in place?: Yes (comment)   Criminal Activity/Legal Involvement  Pertinent to Current Situation/Hospitalization: No - Comment as needed  Activities of Daily Living Home Assistive Devices/Equipment: Walker (specify type) ADL Screening (condition at time of admission) Patient's cognitive ability adequate to safely complete daily activities?: Yes Is the patient deaf or have difficulty hearing?: Yes Does the patient have difficulty seeing, even when wearing glasses/contacts?: No Does the patient have difficulty concentrating, remembering, or making decisions?: No Patient able to express need for assistance with ADLs?: Yes Does the patient have difficulty dressing or bathing?: Yes Independently performs ADLs?: No Communication: Independent Dressing (OT): Needs assistance Is this a change from baseline?: Pre-admission baseline Grooming: Needs assistance Is this a change from baseline?: Pre-admission baseline Feeding: Independent Bathing: Needs assistance Is this a change from baseline?: Pre-admission baseline Toileting: Needs assistance Is this a change from baseline?: Pre-admission baseline In/Out Bed: Needs assistance Is this a change from baseline?: Pre-admission baseline Walks in Home: Independent Does the patient have difficulty walking or climbing stairs?: Yes Weakness of Legs: Right Weakness of Arms/Hands: None  Permission Sought/Granted Permission sought to share information with : Family Supports Permission granted to share information with : Yes, Verbal Permission Granted  Share Information with NAME: Osborne Oman     Permission granted to share info w Relationship: daughter  Permission granted to share info w Contact Information: [phone]  Emotional Assessment Appearance:: Appears stated age Attitude/Demeanor/Rapport: Gracious,Engaged Affect (typically observed): Accepting,Pleasant Orientation: : Oriented to Self,Oriented to Place,Oriented to  Time,Oriented to Situation Alcohol / Substance Use: Not Applicable Psych Involvement:  No (comment)  Admission diagnosis:  Hip fracture ([place]) [S72.009A] Closed fracture of right hip, initial encounter ([place]) [S72.001A] Closed right hip fracture, initial encounter [facility]) [S72.001A] Patient Active Problem List   Diagnosis Date Noted  . Malnutrition of moderate degree 04/14/2020  . Closed right hip fracture, initial encounter ([place]) 04/13/2020  . Hip fracture ([place]) 04/13/2020  . Fibromyalgia 09/28/2018  . Pulmonary HTN ([place]) 09/28/2018  . Edema of both legs 03/17/2014  . Renal fibromuscular dysplasia ([place]) 03/17/2014  . DJD - back 03/17/2014  . Mitral valve disorder 01/05/2009  . Palpitations 01/05/2009  . Chest pain 01/05/2009   PCP:  Crist Infante, MD Pharmacy:   [id number] [place] [address] [id number]: [phone]: [phone]    Social Determinants of Health (SDOH) Interventions    Readmission Risk Interventions Readmission Risk Prevention Plan 04/14/2020  Post Dischage Appt Complete  Medication Screening Complete  Transportation Screening Complete  Some recent data might be hidden

## 2020-04-14 NOTE — Anesthesia Preprocedure Evaluation (Addendum)
Anesthesia Evaluation  Patient identified by MRN, date of birth, ID band Patient awake    Reviewed: Allergy & Precautions, NPO status , Patient's Chart, lab work & pertinent test results, reviewed documented beta blocker date and time   History of Anesthesia Complications Negative for: history of anesthetic complications  Airway Mallampati: III  TM Distance: >3 FB Neck ROM: Full    Dental  (+) Dental Advisory Given   Pulmonary neg pulmonary ROS,    Pulmonary exam normal        Cardiovascular hypertension, Pt. on home beta blockers and Pt. on medications Normal cardiovascular exam   '22 TTE - EF 60 to 65%. Mild left ventricular hypertrophy.  Grade I diastolic dysfunction (impaired relaxation). RV function is normal. There is severely elevated pulmonary artery systolic pressure. The pericardial effusion is circumferential. Trivial mitral valve regurgitation. Mild TR.     Neuro/Psych negative neurological ROS  negative psych ROS   GI/Hepatic negative GI ROS, Neg liver ROS,   Endo/Other  negative endocrine ROS  Renal/GU negative Renal ROS     Musculoskeletal  (+) Arthritis , Fibromyalgia -  Abdominal   Peds  Hematology negative hematology ROS (+)  Thrombocytopenia, Plt 113k    Anesthesia Other Findings Covid test negative   Reproductive/Obstetrics                           Anesthesia Physical Anesthesia Plan  ASA: III  Anesthesia Plan: Spinal   Post-op Pain Management:    Induction:   PONV Risk Score and Plan: 2 and Treatment may vary due to age or medical condition and Propofol infusion  Airway Management Planned: Natural Airway and Simple Face Mask  Additional Equipment: None  Intra-op Plan:   Post-operative Plan:   Informed Consent: I have reviewed the patients History and Physical, chart, labs and discussed the procedure including the risks, benefits and alternatives for  the proposed anesthesia with the patient or authorized representative who has indicated his/her understanding and acceptance.       Plan Discussed with: CRNA and Anesthesiologist  Anesthesia Plan Comments: (Labs reviewed, platelets acceptable. Discussed risks and benefits of spinal, including spinal/epidural hematoma, infection, failed block, and PDPH. Patient expressed understanding and wished to proceed. )      Anesthesia Quick Evaluation
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
## 2020-04-15 ENCOUNTER — Encounter (HOSPITAL_COMMUNITY): Payer: Self-pay | Admitting: Internal Medicine

## 2020-04-15 ENCOUNTER — Inpatient Hospital Stay (HOSPITAL_COMMUNITY): Payer: Medicare Other

## 2020-04-15 ENCOUNTER — Encounter (HOSPITAL_COMMUNITY)
Admission: EM | Disposition: A | Discharge status: Skilled Nursing Facility | Payer: Self-pay | Source: Home / Self Care | Attending: Internal Medicine

## 2020-04-15 ENCOUNTER — Inpatient Hospital Stay (HOSPITAL_COMMUNITY): Payer: Medicare Other | Admitting: Anesthesiology

## 2020-04-15 DIAGNOSIS — S72001A Fracture of unspecified part of neck of right femur, initial encounter for closed fracture: Secondary | ICD-10-CM | POA: Diagnosis not present

## 2020-04-15 HISTORY — PX: INTRAMEDULLARY (IM) NAIL INTERTROCHANTERIC: SHX5875

## 2020-04-15 LAB — BASIC METABOLIC PANEL
Anion gap: 5 (ref 5–15)
BUN: 18 mg/dL (ref 8–23)
CO2: 25 mmol/L (ref 22–32)
Calcium: 8.4 mg/dL — ABNORMAL LOW (ref 8.9–10.3)
Chloride: 106 mmol/L (ref 98–111)
Creatinine, Ser: 0.67 mg/dL (ref 0.44–1.00)
GFR, Estimated: >60 mL/min (ref >60)
Glucose, Bld: 95 mg/dL (ref 70–99)
Potassium: 4.2 mmol/L (ref 3.5–5.1)
Sodium: 136 mmol/L (ref 135–145)

## 2020-04-15 LAB — CBC
HCT: 37.4 % (ref 36.0–46.0)
Hemoglobin: 12.2 g/dL (ref 12.0–15.0)
MCH: 30.7 pg (ref 26.0–34.0)
MCHC: 32.6 g/dL (ref 30.0–36.0)
MCV: 94.2 fL (ref 80.0–100.0)
Platelets: 113 10*3/uL — ABNORMAL LOW (ref 150–400)
RBC: 3.97 MIL/uL (ref 3.87–5.11)
RDW: 13.3 % (ref 11.5–15.5)
WBC: 6.6 10*3/uL (ref 4.0–10.5)
nRBC: 0 % (ref 0.0–0.2)

## 2020-04-15 LAB — URINE CULTURE: Culture: NO GROWTH

## 2020-04-15 LAB — MAGNESIUM: Magnesium: 2.3 mg/dL (ref 1.7–2.4)

## 2020-04-15 SURGERY — FIXATION, FRACTURE, INTERTROCHANTERIC, WITH INTRAMEDULLARY ROD
Anesthesia: Spinal | Site: Hip | Laterality: Right

## 2020-04-15 MED ORDER — SENNA 8.6 MG PO TABS
1.0000 | ORAL_TABLET | Freq: Two times a day (BID) | ORAL | Status: DC
  Administered 2020-04-15 – 2020-04-19 (×8): 8.6 mg via ORAL
  Filled 2020-04-15 (×9): qty 1

## 2020-04-15 MED ORDER — FENTANYL CITRATE (PF) 100 MCG/2ML IJ SOLN
INTRAMUSCULAR | Status: DC | PRN
  Administered 2020-04-15: 25 ug via INTRAVENOUS
  Administered 2020-04-15: 50 ug via INTRAVENOUS
  Administered 2020-04-15: 25 ug via INTRAVENOUS

## 2020-04-15 MED ORDER — TRANEXAMIC ACID-NACL 1000-0.7 MG/100ML-% IV SOLN
INTRAVENOUS | Status: AC
  Filled 2020-04-15: qty 100

## 2020-04-15 MED ORDER — SODIUM CHLORIDE 0.9 % IR SOLN
Status: DC | PRN
  Administered 2020-04-15: 1000 mL

## 2020-04-15 MED ORDER — METOCLOPRAMIDE HCL 5 MG/ML IJ SOLN: 5.0000 mg | Freq: Three times a day (TID) | INTRAMUSCULAR | Status: DC | PRN

## 2020-04-15 MED ORDER — ONDANSETRON HCL 4 MG PO TABS
4.0000 mg | ORAL_TABLET | Freq: Four times a day (QID) | ORAL | Status: DC | PRN
  Administered 2020-04-17 – 2020-04-19 (×2): 4 mg via ORAL
  Filled 2020-04-15 (×2): qty 1

## 2020-04-15 MED ORDER — ONDANSETRON HCL 4 MG/2ML IJ SOLN: 4.0000 mg | Freq: Four times a day (QID) | INTRAMUSCULAR | Status: DC | PRN

## 2020-04-15 MED ORDER — PHENYLEPHRINE HCL (PRESSORS) 10 MG/ML IV SOLN
INTRAVENOUS | Status: DC | PRN
  Administered 2020-04-15 (×2): 80 ug via INTRAVENOUS

## 2020-04-15 MED ORDER — FENTANYL CITRATE (PF) 100 MCG/2ML IJ SOLN: 25.0000 ug | INTRAMUSCULAR | Status: DC | PRN

## 2020-04-15 MED ORDER — ISOPROPYL ALCOHOL 70 % SOLN
Status: DC | PRN
  Administered 2020-04-15: 1 via TOPICAL

## 2020-04-15 MED ORDER — DOCUSATE SODIUM 100 MG PO CAPS
100.0000 mg | ORAL_CAPSULE | Freq: Two times a day (BID) | ORAL | Status: DC
  Administered 2020-04-15 – 2020-04-19 (×8): 100 mg via ORAL
  Filled 2020-04-15 (×9): qty 1

## 2020-04-15 MED ORDER — PHENOL 1.4 % MT LIQD: 1.0000 | OROMUCOSAL | Status: DC | PRN

## 2020-04-15 MED ORDER — SODIUM CHLORIDE 0.9 % IV SOLN: INTRAVENOUS | Status: DC

## 2020-04-15 MED ORDER — HYDROCODONE-ACETAMINOPHEN 5-325 MG PO TABS
1.0000 | ORAL_TABLET | ORAL | Status: DC | PRN
  Administered 2020-04-15 – 2020-04-17 (×7): 2 via ORAL
  Administered 2020-04-17: 1 via ORAL
  Administered 2020-04-17 (×2): 2 via ORAL
  Administered 2020-04-17: 1 via ORAL
  Administered 2020-04-18 – 2020-04-19 (×7): 2 via ORAL
  Filled 2020-04-15 (×11): qty 2
  Filled 2020-04-15: qty 1
  Filled 2020-04-15 (×4): qty 2
  Filled 2020-04-15: qty 1
  Filled 2020-04-15: qty 2

## 2020-04-15 MED ORDER — LACTATED RINGERS IV SOLN: INTRAVENOUS | Status: DC | PRN

## 2020-04-15 MED ORDER — PROPOFOL 500 MG/50ML IV EMUL
INTRAVENOUS | Status: DC | PRN
  Administered 2020-04-15: 50 ug/kg/min via INTRAVENOUS

## 2020-04-15 MED ORDER — ENSURE PRE-SURGERY PO LIQD
296.0000 mL | ORAL | Status: AC
  Administered 2020-04-15: 296 mL via ORAL
  Filled 2020-04-15: qty 296

## 2020-04-15 MED ORDER — ISOPROPYL ALCOHOL 70 % SOLN
Status: AC
  Filled 2020-04-15: qty 480

## 2020-04-15 MED ORDER — ONDANSETRON HCL 4 MG/2ML IJ SOLN: 4.0000 mg | Freq: Once | INTRAMUSCULAR | Status: DC | PRN

## 2020-04-15 MED ORDER — LORAZEPAM 0.5 MG PO TABS
0.5000 mg | ORAL_TABLET | Freq: Once | ORAL | Status: AC
  Administered 2020-04-15: 0.5 mg via ORAL
  Filled 2020-04-15: qty 1

## 2020-04-15 MED ORDER — FENTANYL CITRATE (PF) 100 MCG/2ML IJ SOLN
INTRAMUSCULAR | Status: AC
  Filled 2020-04-15: qty 2

## 2020-04-15 MED ORDER — ENOXAPARIN SODIUM 40 MG/0.4ML ~~LOC~~ SOLN
40.0000 mg | SUBCUTANEOUS | Status: DC
  Administered 2020-04-16 – 2020-04-19 (×4): 40 mg via SUBCUTANEOUS
  Filled 2020-04-15 (×4): qty 0.4

## 2020-04-15 MED ORDER — BUPIVACAINE IN DEXTROSE 0.75-8.25 % IT SOLN
INTRATHECAL | Status: DC | PRN
Start: 2020-04-15 — End: 2020-04-15
  Administered 2020-04-15: 1.6 mL via INTRATHECAL

## 2020-04-15 MED ORDER — EPHEDRINE SULFATE 50 MG/ML IJ SOLN
INTRAMUSCULAR | Status: DC | PRN
  Administered 2020-04-15 (×2): 10 mg via INTRAVENOUS

## 2020-04-15 MED ORDER — CHLORHEXIDINE GLUCONATE CLOTH 2 % EX PADS
6.0000 | MEDICATED_PAD | Freq: Every day | CUTANEOUS | Status: DC
  Administered 2020-04-17 – 2020-04-19 (×2): 6 via TOPICAL

## 2020-04-15 MED ORDER — OXYCODONE HCL 5 MG/5ML PO SOLN
5.0000 mg | Freq: Once | ORAL | Status: DC | PRN
Start: 2020-04-15 — End: 2020-04-15

## 2020-04-15 MED ORDER — MENTHOL 3 MG MT LOZG: 1.0000 | LOZENGE | OROMUCOSAL | Status: DC | PRN

## 2020-04-15 MED ORDER — PROPOFOL 500 MG/50ML IV EMUL
INTRAVENOUS | Status: DC | PRN
  Administered 2020-04-15: 20 mg via INTRAVENOUS

## 2020-04-15 MED ORDER — ONDANSETRON HCL 4 MG/2ML IJ SOLN
INTRAMUSCULAR | Status: DC | PRN
  Administered 2020-04-15: 4 mg via INTRAVENOUS

## 2020-04-15 MED ORDER — CEFAZOLIN SODIUM-DEXTROSE 2-4 GM/100ML-% IV SOLN
2.0000 g | Freq: Four times a day (QID) | INTRAVENOUS | Status: AC
  Administered 2020-04-15 (×2): 2 g via INTRAVENOUS
  Filled 2020-04-15 (×2): qty 100

## 2020-04-15 MED ORDER — OXYCODONE HCL 5 MG PO TABS: 5.0000 mg | ORAL_TABLET | Freq: Once | ORAL | Status: DC | PRN

## 2020-04-15 MED ORDER — METOCLOPRAMIDE HCL 5 MG PO TABS: 5.0000 mg | ORAL_TABLET | Freq: Three times a day (TID) | ORAL | Status: DC | PRN

## 2020-04-15 SURGICAL SUPPLY — 49 items
ADH SKN CLS APL DERMABOND .7 (GAUZE/BANDAGES/DRESSINGS) ×1
APL PRP STRL LF DISP 70% ISPRP (MISCELLANEOUS) ×1
BAG SPEC THK2 15X12 ZIP CLS (MISCELLANEOUS)
BAG ZIPLOCK 12X15 (MISCELLANEOUS) IMPLANT
BIT DRILL CANN LG 4.3MM (BIT) IMPLANT
BIT DRILL LAG SCREW (DRILL) IMPLANT
CHLORAPREP W/TINT 26 (MISCELLANEOUS) ×2 IMPLANT
COVER PERINEAL POST (MISCELLANEOUS) ×2 IMPLANT
COVER SURGICAL LIGHT HANDLE (MISCELLANEOUS) ×2 IMPLANT
COVER WAND RF STERILE (DRAPES) IMPLANT
DERMABOND ADVANCED (GAUZE/BANDAGES/DRESSINGS) ×1
DERMABOND ADVANCED .7 DNX12 (GAUZE/BANDAGES/DRESSINGS) ×2 IMPLANT
DRAPE C-ARM 42X120 X-RAY (DRAPES) ×2 IMPLANT
DRAPE C-ARMOR (DRAPES) ×2 IMPLANT
DRAPE IMP U-DRAPE 54X76 (DRAPES) ×4 IMPLANT
DRAPE SHEET LG 3/4 BI-LAMINATE (DRAPES) ×4 IMPLANT
DRAPE STERI IOBAN 125X83 (DRAPES) ×2 IMPLANT
DRAPE U-SHAPE 47X51 STRL (DRAPES) ×4 IMPLANT
DRILL BIT CANN LG 4.3MM (BIT) ×2
DRILL LAG SCREW (DRILL) ×2
DRSG AQUACEL AG ADV 3.5X 4 (GAUZE/BANDAGES/DRESSINGS) ×1 IMPLANT
DRSG AQUACEL AG ADV 3.5X 6 (GAUZE/BANDAGES/DRESSINGS) ×1 IMPLANT
DRSG MEPILEX BORDER 4X4 (GAUZE/BANDAGES/DRESSINGS) ×4 IMPLANT
DRSG MEPILEX BORDER 4X8 (GAUZE/BANDAGES/DRESSINGS) IMPLANT
ELECT BLADE TIP CTD 4 INCH (ELECTRODE) IMPLANT
FACESHIELD WRAPAROUND (MASK) ×4 IMPLANT
FACESHIELD WRAPAROUND OR TEAM (MASK) ×2 IMPLANT
GAUZE SPONGE 4X4 12PLY STRL (GAUZE/BANDAGES/DRESSINGS) ×2 IMPLANT
GLOVE BIO SURGEON STRL SZ8.5 (GLOVE) ×4 IMPLANT
GLOVE SRG 8 PF TXTR STRL LF DI (GLOVE) ×1 IMPLANT
GLOVE SURG ENC TEXT LTX SZ7.5 (GLOVE) ×4 IMPLANT
GLOVE SURG UNDER POLY LF SZ8 (GLOVE) ×2
GLOVE SURG UNDER POLY LF SZ8.5 (GLOVE) ×2 IMPLANT
GOWN SPEC L3 XXLG W/TWL (GOWN DISPOSABLE) ×2 IMPLANT
GUIDEPIN 3.2X17.5 THRD DISP (PIN) ×2 IMPLANT
HFN 125 DEG 11MM X 180MM (Orthopedic Implant) ×1 IMPLANT
HIP FRA NAIL LAG SCREW 10.5X90 (Orthopedic Implant) ×2 IMPLANT
KIT BASIN OR (CUSTOM PROCEDURE TRAY) ×2 IMPLANT
KIT TURNOVER KIT A (KITS) IMPLANT
MANIFOLD NEPTUNE II (INSTRUMENTS) ×2 IMPLANT
MARKER SKIN DUAL TIP RULER LAB (MISCELLANEOUS) ×2 IMPLANT
PACK GENERAL/GYN (CUSTOM PROCEDURE TRAY) ×2 IMPLANT
PENCIL SMOKE EVACUATOR (MISCELLANEOUS) IMPLANT
SCREW BONE CORTICAL 5.0X3 (Screw) ×1 IMPLANT
SCREW LAG HIP FRA NAIL 10.5X90 (Orthopedic Implant) IMPLANT
SUT MNCRL AB 3-0 PS2 18 (SUTURE) ×2 IMPLANT
SUT MON AB 2-0 CT1 36 (SUTURE) ×1 IMPLANT
SUT VIC AB 1 CT1 36 (SUTURE) ×3 IMPLANT
TOWEL OR 17X26 10 PK STRL BLUE (TOWEL DISPOSABLE) ×2 IMPLANT

## 2020-04-15 NOTE — Discharge Instructions (Signed)
 Dr. Nithin Demeo Adult Hip & Knee Specialist [facility] [address] , [place] [address] [phone]   POSTOPERATIVE DIRECTIONS    Hip Rehabilitation, Guidelines Following Surgery   WEIGHT BEARING Weight bearing as tolerated with assist device (walker, cane, etc) as directed, use it as long as suggested by your surgeon or therapist, typically at least 4-6 weeks.   HOME CARE INSTRUCTIONS  Remove items at home which could result in a fall. This includes throw rugs or furniture in walking pathways.  Continue medications as instructed at time of discharge.  You may have some home medications which will be placed on hold until you complete the course of blood thinner medication.  4 days after discharge, you may start showering. No tub baths or soaking your incisions. Do not put on socks or shoes without following the instructions of your caregivers.   Sit on chairs with arms. Use the chair arms to help push yourself up when arising.  Arrange for the use of a toilet seat elevator so you are not sitting low.   Walk with walker as instructed.  You may resume a sexual relationship in one month or when given the OK by your caregiver.  Use walker as long as suggested by your caregivers.  Avoid periods of inactivity such as sitting longer than an hour when not asleep. This helps prevent blood clots.  You may return to work once you are cleared by your surgeon.  Do not drive a car for 6 weeks or until released by your surgeon.  Do not drive while taking narcotics.  Wear elastic stockings for two weeks following surgery during the day but you may remove then at night.  Make sure you keep all of your appointments after your operation with all of your doctors and caregivers. You should call the office at the above phone number and make an appointment for approximately two weeks after the date of your surgery. Please pick up a stool softener and laxative  for home use as long as you are requiring pain medications.  ICE to the affected hip every three hours for 30 minutes at a time and then as needed for pain and swelling. Continue to use ice on the hip for pain and swelling from surgery. You may notice swelling that will progress down to the foot and ankle.  This is normal after surgery.  Elevate the leg when you are not up walking on it.   It is important for you to complete the blood thinner medication as prescribed by your doctor.  Continue to use the breathing machine which will help keep your temperature down.  It is common for your temperature to cycle up and down following surgery, especially at night when you are not up moving around and exerting yourself.  The breathing machine keeps your lungs expanded and your temperature down.  RANGE OF MOTION AND STRENGTHENING EXERCISES  These exercises are designed to help you keep full movement of your hip joint. Follow your caregiver's or physical therapist's instructions. Perform all exercises about fifteen times, three times per day or as directed. Exercise both hips, even if you have had only one joint replacement. These exercises can be done on a training (exercise) mat, on the floor, on a table or on a bed. Use whatever works the best and is most comfortable for you. Use music or television while you are exercising so that the exercises are a pleasant break in your day. This   will make your life better with the exercises acting as a break in routine you can look forward to.  Lying on your back, slowly slide your foot toward your buttocks, raising your knee up off the floor. Then slowly slide your foot back down until your leg is straight again.  Lying on your back spread your legs as far apart as you can without causing discomfort.  Lying on your side, raise your upper leg and foot straight up from the floor as far as is comfortable. Slowly lower the leg and repeat.  Lying on your back, tighten up the  muscle in the front of your thigh (quadriceps muscles). You can do this by keeping your leg straight and trying to raise your heel off the floor. This helps strengthen the largest muscle supporting your knee.  Lying on your back, tighten up the muscles of your buttocks both with the legs straight and with the knee bent at a comfortable angle while keeping your heel on the floor.   SKILLED REHAB INSTRUCTIONS: If the patient is transferred to a skilled rehab facility following release from the hospital, a list of the current medications will be sent to the facility for the patient to continue.  When discharged from the skilled rehab facility, please have the facility set up the patient's Home Health Physical Therapy prior to being released. Also, the skilled facility will be responsible for providing the patient with their medications at time of release from the facility to include their pain medication and their blood thinner medication. If the patient is still at the rehab facility at time of the two week follow up appointment, the skilled rehab facility will also need to assist the patient in arranging follow up appointment in our office and any transportation needs.  MAKE SURE YOU:  Understand these instructions.  Will watch your condition.  Will get help right away if you are not doing well or get worse.  Pick up stool softner and laxative for home use following surgery while on pain medications. Daily dry dressing changes as needed. In 4 days, you may remove your dressings and begin taking showers - no tub baths or soaking the incisions. Continue to use ice for pain and swelling after surgery. Do not use any lotions or creams on the incision until instructed by your surgeon.   

## 2020-04-15 NOTE — Anesthesia Postprocedure Evaluation (Signed)
Anesthesia Post Note  Patient: Ashley Washington  Procedure(s) Performed: INTRAMEDULLARY (IM) NAIL INTERTROCHANTRIC (Right Hip)     Patient location during evaluation: PACU Anesthesia Type: Spinal Level of consciousness: awake and alert Pain management: pain level controlled Vital Signs Assessment: post-procedure vital signs reviewed and stable Respiratory status: spontaneous breathing and respiratory function stable Cardiovascular status: blood pressure returned to baseline and stable Postop Assessment: spinal receding and no apparent nausea or vomiting Anesthetic complications: no   No complications documented.  Last Vitals:  Vitals:   04/15/20 1045 04/15/20 1048  BP: (!) 146/66   Pulse: 61 (!) 58  Resp: 19 18  Temp:    SpO2: 97% 98%    Last Pain:  Vitals:   04/15/20 1048  TempSrc:   PainSc: 3     LLE Motor Response: Purposeful movement (04/15/20 1048) LLE Sensation: No pain (04/15/20 1048) RLE Motor Response: Purposeful movement (04/15/20 1048) RLE Sensation: Pain (04/15/20 1048)      Audry Pili

## 2020-04-15 NOTE — Progress Notes (Addendum)
PROGRESS NOTE  Ashley Washington [id number] DOB: [birth year] DOA: 04/13/2020 PCP: Crist Infante, MD  HPI/Recap of past 24 hours: Patient was admitted last night with a fall at home.  Patient stated that she was hurrying to the bathroom and she tripped fell in the middle of the cord.  It was not the edge of the carpet that tripped her.  She was taken to the OR this morning with right hip fracture repair.  Subjective: April 15, 2020 Patient seen and examined at bedside she is complaining of pain in the right hip from the surgery also feels a little nervous requesting for a one-time dose of Xanax to help her relax.  Patient is hard of hearing.  Assessment/Plan: Principal Problem:   Closed right hip fracture, initial encounter [facility]) Active Problems:   Mitral valve disorder   Pulmonary HTN (HCC)   Hip fracture (HCC)   Malnutrition of moderate degree #1 right hip fracture secondary to mechanical fall status post repair April 15, 2020  2. Mechanical fall  3. Urinary incontinence office outpatient to be reevaluated by PCP to take if she needs to be started irrigation  4. Hard of hearing  5. Thrombocytopenia platelet is 113,000 today we'll continue to monitor  6. History of mitral valve prolapse followed by cardiology  7. Hypertension continue atenolol  8. Malnutrition of moderate degree. Patient has been started on supplements oral shake Code Status: Full  Severity of Illness: The appropriate patient status for this patient is INPATIENT. Inpatient status is judged to be reasonable and necessary in order to provide the required intensity of service to ensure the patient's safety. The patient's presenting symptoms, physical exam findings, and initial radiographic and laboratory data in the context of their chronic comorbidities is felt to place them at high risk for further clinical deterioration. Furthermore, it is not anticipated that the patient will be medically stable for  discharge from the hospital within 2 midnights of admission. The following factors support the patient status of inpatient.   " The patient's presenting symptoms include right hip pain. " The worrisome physical exam findings include right hip pain with fracture " The initial radiographic and laboratory data are worrisome because of fracture. " The chronic co-morbidities include fracture history of lumbar spinal stenosis.   * I certify that at the point of admission it is my clinical judgment that the patient will require inpatient hospital care spanning beyond 2 midnights from the point of admission due to high intensity of service, high risk for further deterioration and high frequency of surveillance required.*    Family Communication: None  Disposition Plan: To be determined   Consultants:  Orthopedic  Cardiology  Procedures:  Right hip fracture repair  Antimicrobials:  Ancef on route operating room  DVT prophylaxis: Lovenox   Objective: Vitals:   04/14/20 0922 04/14/20 1404 04/14/20 2133 04/15/20 0436  BP: 125/69 (!) 148/58 138/66 (!) 151/69  Pulse: 60 (!) 57 (!) 59 (!) 58  Resp: 17 17 16 18   Temp: 98.6 F (37 C) 98.9 F (37.2 C) 98.2 F (36.8 C) 98.3 F (36.8 C)  TempSrc: Oral Oral Oral Oral  SpO2: 93% 96% 97% 95%  Weight:      Height:        Intake/Output Summary (Last 24 hours) at 04/15/2020 0910 Last data filed at 04/15/2020 0901 Gross per 24 hour  Intake 2429.44 ml  Output 900 ml  Net 1529.44 ml   Filed Weights   04/13/20 1711 04/14/20  0411  Weight: 48.9 kg 50.7 kg   Body mass index is 19.19 kg/m.  Exam:  . General: 87 y.o. year-old female well developed well nourished in no acute distress.  Alert and oriented x3.no  painful distress . Cardiovascular: Regular rate and rhythm with no rubs or gallops.  No thyromegaly or JVD noted.   Marland Kitchen Respiratory: Clear to auscultation with no wheezes or rales. Good inspiratory effort. . Abdomen: Soft nontender  nondistended with normal bowel sounds x4 quadrants. . Musculoskeletal: No lower extremity edema. 2/4 pulses in all 4 extremities.  Right hip pain tenderness . Skin: No ulcerative lesions noted or rashes, . Psychiatry: Mood is appropriate for condition and setting    Data Reviewed: CBC: Recent Labs  Lab 04/13/20 1946 04/14/20 0550 04/15/20 0309  WBC 6.3 4.5 6.6  NEUTROABS 4.6  --   --   HGB 12.9 12.4 12.2  HCT 38.8 37.9 37.4  MCV 92.2 92.2 94.2  PLT 101* 108* [id number]*   Basic Metabolic Panel: Recent Labs  Lab 04/13/20 1946 04/14/20 0550 04/15/20 0309  NA 137 138 136  K 4.3 4.3 4.2  CL 102 104 106  CO2 25 25 25   GLUCOSE 103* 106* 95  BUN 17 16 18   CREATININE 0.58 0.66 0.67  CALCIUM 8.9 9.1 8.4*  MG 2.1  --  2.3   GFR: Estimated Creatinine Clearance: 41.1 mL/min (by C-G formula based on SCr of 0.67 mg/dL). Liver Function Tests: Recent Labs  Lab 04/13/20 1946  AST 23  ALT 18  ALKPHOS 64  BILITOT 1.2  PROT 5.8*  ALBUMIN 3.6   No results for input(s): LIPASE, AMYLASE in the last 168 hours. No results for input(s): AMMONIA in the last 168 hours. Coagulation Profile: No results for input(s): INR, PROTIME in the last 168 hours. Cardiac Enzymes: No results for input(s): CKTOTAL, CKMB, CKMBINDEX, TROPONINI in the last 168 hours. BNP (last 3 results) No results for input(s): PROBNP in the last 8760 hours. HbA1C: No results for input(s): HGBA1C in the last 72 hours. CBG: No results for input(s): GLUCAP in the last 168 hours. Lipid Profile: No results for input(s): CHOL, HDL, LDLCALC, TRIG, CHOLHDL, LDLDIRECT in the last 72 hours. Thyroid Function Tests: No results for input(s): TSH, T4TOTAL, FREET4, T3FREE, THYROIDAB in the last 72 hours. Anemia Panel: No results for input(s): VITAMINB12, FOLATE, FERRITIN, TIBC, IRON, RETICCTPCT in the last 72 hours. Urine analysis:    Component Value Date/Time   COLORURINE YELLOW 04/14/2020 0828   APPEARANCEUR CLEAR 04/14/2020  0828   LABSPEC 1.010 04/14/2020 0828   PHURINE 6.0 04/14/2020 0828   GLUCOSEU NEGATIVE 04/14/2020 0828   HGBUR NEGATIVE 04/14/2020 0828   BILIRUBINUR NEGATIVE 04/14/2020 0828   KETONESUR 20 (A) 04/14/2020 0828   PROTEINUR NEGATIVE 04/14/2020 0828   NITRITE NEGATIVE 04/14/2020 0828   LEUKOCYTESUR NEGATIVE 04/14/2020 0828   Sepsis Labs: @LABRCNTIP (procalcitonin:4,lacticidven:4)  ) Recent Results (from the past 240 hour(s))  SARS Coronavirus 2 by RT PCR (hospital order, performed in [facility] hospital lab) Nasopharyngeal Nasopharyngeal Swab     Status: None   Collection Time: 04/13/20 12:52 AM   Specimen: Nasopharyngeal Swab  Result Value Ref Range Status   SARS Coronavirus 2 NEGATIVE NEGATIVE Final    Comment: (NOTE) SARS-CoV-2 target nucleic acids are NOT DETECTED.  The SARS-CoV-2 RNA is generally detectable in upper and lower respiratory specimens during the acute phase of infection. The lowest concentration of SARS-CoV-2 viral copies this assay can detect is 250 copies / mL. A  negative result does not preclude SARS-CoV-2 infection and should not be used as the sole basis for treatment or other patient management decisions.  A negative result may occur with improper specimen collection / handling, submission of specimen other than nasopharyngeal swab, presence of viral mutation(s) within the areas targeted by this assay, and inadequate number of viral copies (<250 copies / mL). A negative result must be combined with clinical observations, patient history, and epidemiological information.  Fact Sheet for Patients:   StrictlyIdeas.no  Fact Sheet for Healthcare Providers: BankingDealers.co.za  This test is not yet approved or  cleared by the [place] FDA and has been authorized for detection and/or diagnosis of SARS-CoV-2 by FDA under an Emergency Use Authorization (EUA).  This EUA will remain in effect (meaning this test  can be used) for the duration of the COVID-19 declaration under Section 564(b)(1) of the Act, 21 U.S.C. section 360bbb-3(b)(1), unless the authorization is terminated or revoked sooner.  Performed at [facility], [place] [address] [place], [place] [address]   Urine culture     Status: None   Collection Time: 04/14/20  8:28 AM   Specimen: Urine, Catheterized  Result Value Ref Range Status   Specimen Description   Final    URINE, CATHETERIZED Performed at [place] [address], [place], [place] [address]    Special Requests   Final    NONE Performed at [facility], [place] [address] [place], [place] [address]    Culture   Final    NO GROWTH Performed at [facility], [place] [address], [place], [place] [address]    Report Status 04/15/2020 FINAL  Final  Surgical pcr screen     Status: None   Collection Time: 04/14/20  5:44 PM   Specimen: Nasal Mucosa; Nasal Swab  Result Value Ref Range Status   MRSA, PCR NEGATIVE NEGATIVE Final   Staphylococcus aureus NEGATIVE NEGATIVE Final    Comment: (NOTE) The Xpert SA Assay (FDA approved for NASAL specimens in patients 45 years of age and older), is one component of a comprehensive surveillance program. It is not intended to diagnose infection nor to guide or monitor treatment. Performed at [facility], [place] [address] [place], [place] [address]       Studies: ECHOCARDIOGRAM COMPLETE  Result Date: 04/14/2020    ECHOCARDIOGRAM REPORT   Patient Name:   Ashley Washington Date of Exam: 04/14/2020 Medical Rec #:  [id number]         Height:       64.0 in Accession #:    [id number]        Weight:       111.8 lb Date of Birth:  1932-08-11         BSA:          1.528 m Patient Age:    40 years          BP:           125/64 mmHg Patient Gender: F                 HR:           56 bpm. Exam Location:  Inpatient Procedure: 2D Echo, Cardiac Doppler and Color Doppler  Indications:    R06.02 SOB  History:        Patient has prior history of Echocardiogram examinations, most  recent 11/23/2018. Abnormal ECG, Pulmonary HTN;                 Signs/Symptoms:Chest Pain.  Sonographer:    Roseanna Rainbow RDCS Referring Phys: [id number] [facility] [place]  Sonographer Comments: Technically difficult study due to poor echo windows. Patient supine and could not turn due to broken hip. IMPRESSIONS  1. Left ventricular ejection fraction, by estimation, is 60 to 65%. The left ventricle has normal function. The left ventricle has no regional wall motion abnormalities. There is mild left ventricular hypertrophy. Left ventricular diastolic parameters are consistent with Grade I diastolic dysfunction (impaired relaxation).  2. Right ventricular systolic function is normal. The right ventricular size is normal. There is severely elevated pulmonary artery systolic pressure. The estimated right ventricular systolic pressure is 0000000 mmHg.  3. The pericardial effusion is circumferential.  4. The mitral valve is normal in structure. Trivial mitral valve regurgitation. No evidence of mitral stenosis.  5. The aortic valve is normal in structure. Aortic valve regurgitation is not visualized. No aortic stenosis is present.  6. The inferior vena cava is normal in size with greater than 50% respiratory variability, suggesting right atrial pressure of 3 mmHg. Comparison(s): No significant change from prior study. Prior images reviewed side by side. FINDINGS  Left Ventricle: Left ventricular ejection fraction, by estimation, is 60 to 65%. The left ventricle has normal function. The left ventricle has no regional wall motion abnormalities. The left ventricular internal cavity size was normal in size. There is  mild left ventricular hypertrophy. Left ventricular diastolic parameters are consistent with Grade I diastolic dysfunction (impaired relaxation). Right Ventricle: The right ventricular size is normal. No  increase in right ventricular wall thickness. Right ventricular systolic function is normal. There is severely elevated pulmonary artery systolic pressure. The tricuspid regurgitant velocity is 3.36 m/s, and with an assumed right atrial pressure of 15 mmHg, the estimated right ventricular systolic pressure is 0000000 mmHg. Left Atrium: Left atrial size was normal in size. Right Atrium: Right atrial size was normal in size. Pericardium: Trivial pericardial effusion is present. The pericardial effusion is circumferential. Mitral Valve: The mitral valve is normal in structure. Trivial mitral valve regurgitation. No evidence of mitral valve stenosis. Tricuspid Valve: The tricuspid valve is normal in structure. Tricuspid valve regurgitation is mild . No evidence of tricuspid stenosis. Aortic Valve: The aortic valve is normal in structure. Aortic valve regurgitation is not visualized. No aortic stenosis is present. Aortic valve mean gradient measures 6.0 mmHg. Aortic valve peak gradient measures 14.0 mmHg. Aortic valve area, by VTI measures 1.90 cm. Pulmonic Valve: The pulmonic valve was normal in structure. Pulmonic valve regurgitation is not visualized. No evidence of pulmonic stenosis. Aorta: The aortic root is normal in size and structure. Venous: The inferior vena cava is normal in size with greater than 50% respiratory variability, suggesting right atrial pressure of 3 mmHg. IAS/Shunts: No atrial level shunt detected by color flow Doppler.  LEFT VENTRICLE PLAX 2D LVIDd:         2.90 cm     Diastology LVIDs:         1.20 cm     LV e' medial:    5.55 cm/s LV PW:         1.40 cm     LV E/e' medial:  15.1 LV IVS:        1.00 cm     LV e' lateral:   8.59 cm/s LVOT diam:     1.80 cm  LV E/e' lateral: 9.8 LV SV:         77 LV SV Index:   51 LVOT Area:     2.54 cm  LV Volumes (MOD) LV vol d, MOD A2C: 58.5 ml LV vol d, MOD A4C: 50.2 ml LV vol s, MOD A2C: 14.3 ml LV vol s, MOD A4C: 16.0 ml LV SV MOD A2C:     44.2 ml LV SV  MOD A4C:     50.2 ml LV SV MOD BP:      37.5 ml RIGHT VENTRICLE            IVC RV S prime:     9.14 cm/s  IVC diam: 2.90 cm TAPSE (M-mode): 2.8 cm LEFT ATRIUM           Index       RIGHT ATRIUM           Index LA diam:      3.10 cm 2.03 cm/m  RA Area:     12.90 cm LA Vol (A2C): 10.8 ml 7.07 ml/m  RA Volume:   27.30 ml  17.87 ml/m LA Vol (A4C): 41.2 ml 26.97 ml/m  AORTIC VALVE AV Area (Vmax):    2.03 cm AV Area (Vmean):   2.05 cm AV Area (VTI):     1.90 cm AV Vmax:           187.00 cm/s AV Vmean:          112.000 cm/s AV VTI:            0.408 m AV Peak Grad:      14.0 mmHg AV Mean Grad:      6.0 mmHg LVOT Vmax:         149.00 cm/s LVOT Vmean:        90.100 cm/s LVOT VTI:          0.304 m LVOT/AV VTI ratio: 0.75  AORTA Ao Root diam: 2.20 cm MITRAL VALVE               TRICUSPID VALVE MV Area (PHT): 2.54 cm    TR Peak grad:   45.2 mmHg MV Decel Time: 299 msec    TR Vmax:        336.00 cm/s MV E velocity: 84.00 cm/s MV A velocity: 81.80 cm/s  SHUNTS MV E/A ratio:  1.03        Systemic VTI:  0.30 m                            Systemic Diam: 1.80 cm Candee Furbish MD Electronically signed by Candee Furbish MD Signature Date/Time: 04/14/2020/2:35:47 PM    Final     Scheduled Meds: . [MAR Hold] atenolol  25 mg Oral Daily  . [MAR Hold] feeding supplement (KATE FARMS STANDARD 1.4)  325 mL Oral BID BM  . [MAR Hold] multivitamin with minerals  1 tablet Oral Daily  . povidone-iodine  2 application Topical Once  . [MAR Hold] protein supplement  1 Scoop Oral TID WC    Continuous Infusions: . sodium chloride 75 mL/hr at 04/14/20 2153  .  ceFAZolin (ANCEF) IV    . tranexamic acid    . tranexamic acid       LOS: 2 days     Cristal Deer, MD [facility]  To reach me or the doctor on call, go to: [email or web address] Password [email or web address]  04/15/2020, 9:10 AM

## 2020-04-15 NOTE — Anesthesia Procedure Notes (Signed)
Spinal  Patient location during procedure: OR Start time: 04/15/2020 8:28 AM End time: 04/15/2020 8:32 AM Staffing Performed: anesthesiologist  Anesthesiologist: Audry Pili, MD Preanesthetic Checklist Completed: patient identified, IV checked, risks and benefits discussed, surgical consent, monitors and equipment checked, pre-op evaluation and timeout performed Spinal Block Patient position: right lateral decubitus Prep: DuraPrep Patient monitoring: heart rate, cardiac monitor, continuous pulse ox and blood pressure Approach: midline Location: L3-4 Injection technique: single-shot Needle Needle type: Quincke  Needle gauge: 22 G Additional Notes Consent was obtained prior to the procedure with all questions answered and concerns addressed. Risks including, but not limited to, bleeding, infection, nerve damage, paralysis, failed block, inadequate analgesia, allergic reaction, high spinal, itching, and headache were discussed and the patient wished to proceed. Functioning IV was confirmed and monitors were applied. Sterile prep and drape, including hand hygiene, mask, and sterile gloves were used. The patient was positioned and the spine was prepped. The skin was anesthetized with lidocaine. Free flow of clear CSF was obtained prior to injecting local anesthetic into the CSF. The spinal needle aspirated freely following injection. The needle was carefully withdrawn. The patient tolerated the procedure well.   Renold Don, MD

## 2020-04-15 NOTE — Op Note (Signed)
OPERATIVE REPORT  SURGEON: Rod Can, MD   ASSISTANT: Staff.  PREOPERATIVE DIAGNOSIS: Right intertrochanteric femur fracture.   POSTOPERATIVE DIAGNOSIS: Right intertrochanteric femur fracture.   PROCEDURE: Intramedullary fixation, Right femur.   IMPLANTS: Biomet Affixus Hip Fracture Nail, 11 by 180 mm, 125 degrees. 10.5 x 90 mm Hip Fracture Nail Lag Screw. 5 x 34 mm distal interlocking screw 1.  ANESTHESIA:  MAC and Spinal  ESTIMATED BLOOD LOSS:-50 mL    ANTIBIOTICS: 2 g Ancef.  DRAINS: None.  COMPLICATIONS: None.   CONDITION: PACU - hemodynamically stable.   BRIEF CLINICAL NOTE: Ashley Washington is a 87 y.o. female who presented with an intertrochanteric femur fracture seen on MRI. The patient was admitted to the hospitalist service and underwent perioperative risk stratification and medical optimization. The risks, benefits, and alternatives to the procedure were explained, and the patient elected to proceed.  PROCEDURE IN DETAIL: Surgical site was marked by myself. The patient was taken to the operating room and anesthesia was induced on the bed. A foley catheter was placed. The patient was then transferred to the [facility] table and the nonoperative lower extremity was scissored underneath the operative side. Traction, internal rotation, and adduction was applied to the affected side. The hip was prepped and draped in the normal sterile surgical fashion. Timeout was called verifying side and site of surgery. Preop antibiotics were given with 60 minutes of beginning the procedure.  Fluoroscopy was used to define the patient's anatomy. A 4 cm incision was made just proximal to the tip of the greater trochanter. The awl was used to obtain the standard starting point for a trochanteric entry nail under fluoroscopic control. The guidepin was placed. The entry reamer was used to open the proximal femur.  On the back table, the nail was assembled onto the jig. The nail was placed  into the femur without any difficulty. Through a separate stab incision, the cannula was placed down to the bone in preparation for the cephalomedullary device. A guidepin was placed into the femoral head using AP and lateral fluoroscopy views. The pin was measured, and then reaming was performed to the appropriate depth. The lag screw was inserted to the appropriate depth. The fracture was compressed through the jig. The setscrew was tightened and then loosened one quarter turn. A separate stab incision was created, and the distal interlocking screw was placed using standard AO technique. The jig was removed. Final AP and lateral fluoroscopy views were obtained to confirm fracture reduction and hardware placement. Tip apex distance was appropriate. There was no chondral penetration.  The wounds were copiously irrigated with saline. The wound was closed in layers with #1 Vicryl for the fascia, 2-0 Monocryl for the deep dermal layer, and skin staples. Glue was applied to the skin. Once the glue was fully hardened, sterile dressing was applied. The patient was then awakened from anesthesia and taken to the PACU in stable condition. Sponge needle and instrument counts were correct at the end of the case 2. There were no known complications.  We will readmit the patient to the hospitalist. Weightbearing status will be weightbearing as tolerated with a walker. We will begin Lovenox for DVT prophylaxis and discharge on aspirin 81 mg PO BID for 6 weeks. The patient will work with physical therapy and undergo disposition planning.

## 2020-04-15 NOTE — Plan of Care (Signed)
  Problem: Pain Management: Goal: Pain level will decrease Outcome: Progressing   Problem: Self-Concept: Goal: Ability to maintain and perform role responsibilities to the fullest extent possible will improve Outcome: Progressing   Problem: Activity: Goal: Ability to ambulate and perform ADLs will improve Outcome: Progressing

## 2020-04-15 NOTE — Transfer of Care (Signed)
Immediate Anesthesia Transfer of Care Note  Patient: Ashley Washington  Procedure(s) Performed: INTRAMEDULLARY (IM) NAIL INTERTROCHANTRIC (Right Hip)  Patient Location: PACU  Anesthesia Type:Spinal  Level of Consciousness: awake, alert  and oriented  Airway & Oxygen Therapy: Patient Spontanous Breathing and Patient connected to face mask oxygen  Post-op Assessment: Report given to RN and Post -op Vital signs reviewed and stable  Post vital signs: Reviewed and stable  Last Vitals:  Vitals Value Taken Time  BP 130/65 04/15/20 0940  Temp    Pulse 61 04/15/20 0942  Resp 18 04/15/20 0942  SpO2 100 % 04/15/20 0942  Vitals shown include unvalidated device data.  Last Pain:  Vitals:   04/15/20 0436  TempSrc: Oral  PainSc:       Patients Stated Pain Goal: 2 (16/07/37 1062)  Complications: No complications documented.

## 2020-04-15 NOTE — Addendum Note (Signed)
Addendum  created 04/15/20 1151 by Gean Maidens, CRNA   Intraprocedure Meds edited

## 2020-04-15 NOTE — Consult Note (Signed)
ORTHOPAEDIC CONSULTATION  REQUESTING PHYSICIAN: Myrtie Neither, MD  PCP:  Rodrigo Ran, MD  Chief Complaint: Right hip injury  HPI: Ashley Washington is a 87 y.o. female who complains of increasing right hip pain and inability to weight-bear after she fell last week.  Initial x-rays were negative.  Her pain got worse, and she came back to the ER.  CT was obtained at that point that was also negative.  She subsequently had an MRI which showed an incomplete right intertrochanteric femur fracture.  She was admitted to the hospitalist service for perioperative or stratification medical optimization.  Orthopedic consultation was placed for management of her hip fracture.  Of note, she was also find to have a nondisplaced S2 fracture.  Past Medical History:  Diagnosis Date  . Cancer (HCC)   . Fibromuscular dysplasia of renal artery (HCC)   . Hypertension   . Mitral valve prolapse   . Thyroid disease    Past Surgical History:  Procedure Laterality Date  . ABDOMINAL HYSTERECTOMY    . APPENDECTOMY    . PARATHYROIDECTOMY    . TONSILLECTOMY     Social History   Socioeconomic History  . Marital status: Married    Spouse name: Not on file  . Number of children: Not on file  . Years of education: Not on file  . Highest education level: Not on file  Occupational History  . Not on file  Tobacco Use  . Smoking status: Never Smoker  . Smokeless tobacco: Never Used  Vaping Use  . Vaping Use: Never used  Substance and Sexual Activity  . Alcohol use: Yes    Comment: social  . Drug use: Never  . Sexual activity: Not on file  Other Topics Concern  . Not on file  Social History Narrative  . Not on file   Social Determinants of Health   Financial Resource Strain: Not on file  Food Insecurity: Not on file  Transportation Needs: Not on file  Physical Activity: Not on file  Stress: Not on file  Social Connections: Not on file   Family History  Problem Relation Age of Onset   . Breast cancer Paternal Aunt   . Breast cancer Paternal Aunt   . Colon cancer Neg Hx    Allergies  Allergen Reactions  . Doxycycline Other (See Comments)    Eye irritation  . Fluorometholone Other (See Comments)    Eye irritation due to preservatives  . Other     Antibiotics - causes GI upset  . Pilocarpine Other (See Comments)    Caused blood pressure to drop so low they almost had to call EMS   Prior to Admission medications   Medication Sig Start Date End Date Taking? Authorizing Provider  atenolol (TENORMIN) 25 MG tablet 1/2 tab po qd Patient taking differently: Take 25 mg by mouth daily. 06/09/12  Yes Lewayne Bunting, MD  LORazepam (ATIVAN) 0.5 MG tablet 1/4 of tablet as needed Patient taking differently: Take 0.5 mg by mouth at bedtime as needed for sleep. 06/12/12  Yes Lewayne Bunting, MD  metroNIDAZOLE (METROGEL) 0.75 % gel Apply 1 application topically 2 (two) times daily.   Yes [provider]  nitroGLYCERIN (NITROSTAT) 0.4 MG SL tablet Place 1 tablet (0.4 mg total) under the tongue every 5 (five) minutes as needed for chest pain. 09/30/18 10/30/18 Yes Kilroy, Eda Paschal, PA-C  Propylene Glycol (SYSTANE COMPLETE) 0.6 % SOLN Apply 1 drop to eye daily as needed (dry  eyes).   Yes [provider]  traMADol (ULTRAM) 50 MG tablet Take 50 mg by mouth 3 (three) times daily as needed for moderate pain. 12/08/19  Yes [provider]   MR LUMBAR SPINE WO CONTRAST  Result Date: 04/13/2020 CLINICAL DATA:  Low back pain.  Recent fall. EXAM: MRI LUMBAR SPINE WITHOUT CONTRAST TECHNIQUE: Multiplanar, multisequence MR imaging of the lumbar spine was performed. No intravenous contrast was administered. COMPARISON:  None. FINDINGS: Segmentation:  Standard Alignment: Grade 1 anterolisthesis at L3-4. There is new slight anterolisthesis at S1-2. Vertebrae: Curvilinear signal abnormality within the upper S2 body could be a minimally displaced fracture. No other acute abnormality.  Heterogeneous bone marrow signal in general. Conus medullaris and cauda equina: Conus extends to the L1 level. Conus and cauda equina appear normal. Paraspinal and other soft tissues: Incompletely visualized large left renal cyst appears unchanged. Disc levels: L1-L2: Normal disc space and facet joints. No spinal canal stenosis. No neural foraminal stenosis. L2-L3: Disc space narrowing without herniation. No spinal canal stenosis. No neural foraminal stenosis. L3-L4: Moderate facet hypertrophy with small disc bulge, left asymmetric. Unchanged moderate spinal canal stenosis. Unchanged mild left neural foraminal stenosis. L4-L5: Mild facet hypertrophy. Normal disc. No spinal canal stenosis. No neural foraminal stenosis. L5-S1: Normal disc space and facet joints. No spinal canal stenosis. No neural foraminal stenosis. Visualized sacrum: Normal. IMPRESSION: 1. Possible minimally displaced fracture of the upper S2 body. Consider CT of the sacrum for further evaluation. 2. Unchanged moderate L3-4 spinal canal stenosis. 3. Multilevel facet arthrosis, which may be a source of local low back pain. Electronically Signed   By: Ulyses Jarred M.D.   On: 04/13/2020 22:03   MR HIP RIGHT WO CONTRAST  Result Date: 04/13/2020 CLINICAL DATA:  Negative x-ray, trauma EXAM: MR OF THE RIGHT HIP WITHOUT CONTRAST TECHNIQUE: Multiplanar, multisequence MR imaging was performed. No intravenous contrast was administered. COMPARISON:  None. FINDINGS: Bones: There is a nondisplaced incomplete intratrochanteric fracture extending from the greater trochanter medially. Surrounding marrow edema is noted. The femoral head is normal in shape, configuration and sphericity. No osseous bump at the femoral head neck junction. The visualized bony pelvis appears normal. The visualized sacroiliac joints and symphysis pubis appear normal. Articular cartilage and labrum Articular cartilage: Mild superior joint space loss and chondral thinning is noted. The  ligamentum teres is intact Labrum: There is no gross labral tear or paralabral abnormality. Joint or bursal effusion Joint effusion: A small hip joint effusion is seen. Bursae: No focal periarticular fluid collection. Muscles and tendons Muscles and tendons: There is increased feathery signal seen within the obturator externus and adductor musculature bilaterally. Increased signal seen around the tensor fascia lata and the insertion site of the gluteal tendons. There is mildly increased signal at the hamstrings tendons. There is also increased signal seen within the iliopsoas muscle belly. The piriformis muscles appear symmetric. Other findings Miscellaneous: The visualized internal pelvic contents appear unremarkable. IMPRESSION: Nondisplaced incomplete intratrochanteric fracture extending from the greater trochanter with surrounding marrow edema. Mild to moderate femoroacetabular joint osteoarthritis Diffuse muscular edema surrounding the hip. Insertional gluteal and hamstrings tendinosis Small hip joint effusion Electronically Signed   By: Prudencio Pair M.D.   On: 04/13/2020 21:49   ECHOCARDIOGRAM COMPLETE  Result Date: 04/14/2020    ECHOCARDIOGRAM REPORT   Patient Name:   Marzetta Merino Date of Exam: 04/14/2020 Medical Rec #:  [id number]         Height:  64.0 in Accession #:    [id number]        Weight:       111.8 lb Date of Birth:  18-Jun-1932         BSA:          1.528 m Patient Age:    78 years          BP:           125/64 mmHg Patient Gender: F                 HR:           56 bpm. Exam Location:  Inpatient Procedure: 2D Echo, Cardiac Doppler and Color Doppler Indications:    R06.02 SOB  History:        Patient has prior history of Echocardiogram examinations, most                 recent 11/23/2018. Abnormal ECG, Pulmonary HTN;                 Signs/Symptoms:Chest Pain.  Sonographer:    Roseanna Rainbow RDCS Referring Phys: [id number] [facility]  Sonographer Comments: Technically difficult study due to  poor echo windows. Patient supine and could not turn due to broken hip. IMPRESSIONS  1. Left ventricular ejection fraction, by estimation, is 60 to 65%. The left ventricle has normal function. The left ventricle has no regional wall motion abnormalities. There is mild left ventricular hypertrophy. Left ventricular diastolic parameters are consistent with Grade I diastolic dysfunction (impaired relaxation).  2. Right ventricular systolic function is normal. The right ventricular size is normal. There is severely elevated pulmonary artery systolic pressure. The estimated right ventricular systolic pressure is 84.6 mmHg.  3. The pericardial effusion is circumferential.  4. The mitral valve is normal in structure. Trivial mitral valve regurgitation. No evidence of mitral stenosis.  5. The aortic valve is normal in structure. Aortic valve regurgitation is not visualized. No aortic stenosis is present.  6. The inferior vena cava is normal in size with greater than 50% respiratory variability, suggesting right atrial pressure of 3 mmHg. Comparison(s): No significant change from prior study. Prior images reviewed side by side. FINDINGS  Left Ventricle: Left ventricular ejection fraction, by estimation, is 60 to 65%. The left ventricle has normal function. The left ventricle has no regional wall motion abnormalities. The left ventricular internal cavity size was normal in size. There is  mild left ventricular hypertrophy. Left ventricular diastolic parameters are consistent with Grade I diastolic dysfunction (impaired relaxation). Right Ventricle: The right ventricular size is normal. No increase in right ventricular wall thickness. Right ventricular systolic function is normal. There is severely elevated pulmonary artery systolic pressure. The tricuspid regurgitant velocity is 3.36 m/s, and with an assumed right atrial pressure of 15 mmHg, the estimated right ventricular systolic pressure is 96.2 mmHg. Left Atrium: Left  atrial size was normal in size. Right Atrium: Right atrial size was normal in size. Pericardium: Trivial pericardial effusion is present. The pericardial effusion is circumferential. Mitral Valve: The mitral valve is normal in structure. Trivial mitral valve regurgitation. No evidence of mitral valve stenosis. Tricuspid Valve: The tricuspid valve is normal in structure. Tricuspid valve regurgitation is mild . No evidence of tricuspid stenosis. Aortic Valve: The aortic valve is normal in structure. Aortic valve regurgitation is not visualized. No aortic stenosis is present. Aortic valve mean gradient measures 6.0 mmHg. Aortic valve peak gradient measures 14.0 mmHg. Aortic valve area, by  VTI measures 1.90 cm. Pulmonic Valve: The pulmonic valve was normal in structure. Pulmonic valve regurgitation is not visualized. No evidence of pulmonic stenosis. Aorta: The aortic root is normal in size and structure. Venous: The inferior vena cava is normal in size with greater than 50% respiratory variability, suggesting right atrial pressure of 3 mmHg. IAS/Shunts: No atrial level shunt detected by color flow Doppler.  LEFT VENTRICLE PLAX 2D LVIDd:         2.90 cm     Diastology LVIDs:         1.20 cm     LV e' medial:    5.55 cm/s LV PW:         1.40 cm     LV E/e' medial:  15.1 LV IVS:        1.00 cm     LV e' lateral:   8.59 cm/s LVOT diam:     1.80 cm     LV E/e' lateral: 9.8 LV SV:         77 LV SV Index:   51 LVOT Area:     2.54 cm  LV Volumes (MOD) LV vol d, MOD A2C: 58.5 ml LV vol d, MOD A4C: 50.2 ml LV vol s, MOD A2C: 14.3 ml LV vol s, MOD A4C: 16.0 ml LV SV MOD A2C:     44.2 ml LV SV MOD A4C:     50.2 ml LV SV MOD BP:      37.5 ml RIGHT VENTRICLE            IVC RV S prime:     9.14 cm/s  IVC diam: 2.90 cm TAPSE (M-mode): 2.8 cm LEFT ATRIUM           Index       RIGHT ATRIUM           Index LA diam:      3.10 cm 2.03 cm/m  RA Area:     12.90 cm LA Vol (A2C): 10.8 ml 7.07 ml/m  RA Volume:   27.30 ml  17.87 ml/m LA  Vol (A4C): 41.2 ml 26.97 ml/m  AORTIC VALVE AV Area (Vmax):    2.03 cm AV Area (Vmean):   2.05 cm AV Area (VTI):     1.90 cm AV Vmax:           187.00 cm/s AV Vmean:          112.000 cm/s AV VTI:            0.408 m AV Peak Grad:      14.0 mmHg AV Mean Grad:      6.0 mmHg LVOT Vmax:         149.00 cm/s LVOT Vmean:        90.100 cm/s LVOT VTI:          0.304 m LVOT/AV VTI ratio: 0.75  AORTA Ao Root diam: 2.20 cm MITRAL VALVE               TRICUSPID VALVE MV Area (PHT): 2.54 cm    TR Peak grad:   45.2 mmHg MV Decel Time: 299 msec    TR Vmax:        336.00 cm/s MV E velocity: 84.00 cm/s MV A velocity: 81.80 cm/s  SHUNTS MV E/A ratio:  1.03        Systemic VTI:  0.30 m  Systemic Diam: 1.80 cm Donato Schultz MD Electronically signed by Donato Schultz MD Signature Date/Time: 04/14/2020/2:35:47 PM    Final     Positive ROS: All other systems have been reviewed and were otherwise negative with the exception of those mentioned in the HPI and as above.  Physical Exam: General: Alert, no acute distress Cardiovascular: No pedal edema Respiratory: No cyanosis, no use of accessory musculature GI: No organomegaly, abdomen is soft and non-tender Skin: No lesions in the area of chief complaint Neurologic: Sensation intact distally Psychiatric: Patient is competent for consent with normal mood and affect Lymphatic: No axillary or cervical lymphadenopathy  MUSCULOSKELETAL: Examination the right hip reveals no skin wounds or lesions.  No rotational deformity.  No shortening.  She has pain with attempted logrolling of the hip.  She cannot perform a straight leg raise secondary to pain.  Positive motor function dorsiflexion, plantarflexion, and great toe extension.  She reports intact sensation.  Foot is well-perfused.  Assessment: Right intertrochanteric femur fracture  Plan: I discussed the findings with the patient.  Also called her daughter, Annabelle Harman, and spoke to her on the phone.  She has an  incomplete intertrochanteric femur fracture.  With normal weightbearing activity and ADLs, there is significant risk of the fracture displacing.  The patient is extremely active, and would therefore benefit from surgical stabilization of her hip fracture.  We discussed the risk, benefits, and alternatives to intramedullary fixation of the femur.  Please see statement of risk.  Plan for surgery today.  Of note, S2 fracture was reviewed with Dr. Venita Lick, spine specialist, who recommends conservative management.  The risks, benefits, and alternatives were discussed with the patient. There are risks associated with the surgery including, but not limited to, problems with anesthesia (death), infection, differences in leg length/angulation/rotation, fracture of bones, loosening or failure of implants, malunion, nonunion, hematoma (blood accumulation) which may require surgical drainage, blood clots, pulmonary embolism, nerve injury (foot drop), and blood vessel injury. The patient understands these risks and elects to proceed.   Jonette Pesa, MD [phone]    04/15/2020 7:44 AM

## 2020-04-16 DIAGNOSIS — S72001A Fracture of unspecified part of neck of right femur, initial encounter for closed fracture: Secondary | ICD-10-CM | POA: Diagnosis not present

## 2020-04-16 LAB — BASIC METABOLIC PANEL
Anion gap: 10 (ref 5–15)
BUN: 17 mg/dL (ref 8–23)
CO2: 26 mmol/L (ref 22–32)
Calcium: 8.4 mg/dL — ABNORMAL LOW (ref 8.9–10.3)
Chloride: 106 mmol/L (ref 98–111)
Creatinine, Ser: 0.67 mg/dL (ref 0.44–1.00)
GFR, Estimated: >60 mL/min (ref >60)
Glucose, Bld: 97 mg/dL (ref 70–99)
Potassium: 3.7 mmol/L (ref 3.5–5.1)
Sodium: 142 mmol/L (ref 135–145)

## 2020-04-16 LAB — CBC
HCT: 34.7 % — ABNORMAL LOW (ref 36.0–46.0)
Hemoglobin: 11.5 g/dL — ABNORMAL LOW (ref 12.0–15.0)
MCH: 31.3 pg (ref 26.0–34.0)
MCHC: 33.1 g/dL (ref 30.0–36.0)
MCV: 94.3 fL (ref 80.0–100.0)
Platelets: 98 10*3/uL — ABNORMAL LOW (ref 150–400)
RBC: 3.68 MIL/uL — ABNORMAL LOW (ref 3.87–5.11)
RDW: 13.4 % (ref 11.5–15.5)
WBC: 6.5 10*3/uL (ref 4.0–10.5)
nRBC: 0 % (ref 0.0–0.2)

## 2020-04-16 NOTE — NC FL2 (Signed)
[place] LEVEL OF CARE SCREENING TOOL     IDENTIFICATION  Patient Name: Ashley Washington Birthdate: 1932-08-19 Sex: female Admission Date (Current Location): 04/13/2020  [facility] and [place] Number:  Herbalist and Address:  [facility],  [place] [address] [place]      Provider Number: [id number]  Attending Physician Name and Address:  Cristal Deer, MD  Relative Name and Phone Number:  Osborne Oman (Daughter)   [phone] [facility])    Current Level of Care: Hospital Recommended Level of Care: [place] Prior Approval Number:    Date Approved/Denied:   PASRR Number: [id number]  Discharge Plan: SNF    Current Diagnoses: Patient Active Problem List   Diagnosis Date Noted  . Malnutrition of moderate degree 04/14/2020  . Closed right hip fracture, initial encounter ([place]) 04/13/2020  . Hip fracture ([place]) 04/13/2020  . Fibromyalgia 09/28/2018  . Pulmonary HTN ([place]) 09/28/2018  . Edema of both legs 03/17/2014  . Renal fibromuscular dysplasia ([place]) 03/17/2014  . DJD - back 03/17/2014  . Mitral valve disorder 01/05/2009  . Palpitations 01/05/2009  . Chest pain 01/05/2009    Orientation RESPIRATION BLADDER Height & Weight     Self,Time,Situation,Place  Normal Continent Weight: 111 lb 12.4 oz (50.7 kg) Height:  5\' 4"  (162.6 cm)  BEHAVIORAL SYMPTOMS/MOOD NEUROLOGICAL BOWEL NUTRITION STATUS      Continent Diet  AMBULATORY STATUS COMMUNICATION OF NEEDS Skin   Extensive Assist Verbally Normal                       Personal Care Assistance Level of Assistance  Bathing,Feeding,Dressing,Total care Bathing Assistance: Limited assistance Feeding assistance: Independent Dressing Assistance: Limited assistance Total Care Assistance: Limited assistance   Functional Limitations Info  Sight,Hearing,Speech Sight Info: Adequate Hearing Info: Impaired Speech Info: Adequate    SPECIAL CARE FACTORS  FREQUENCY     PT Min 5X per week                  Contractures Contractures Info: Not present    Additional Factors Info                  Current Medications (04/16/2020):  This is the current hospital active medication list Current Facility-Administered Medications  Medication Dose Route Frequency Provider Last Rate Last Admin  . 0.9 %  sodium chloride infusion   Intravenous Continuous Rod Can, MD   Stopped at 04/16/20 1100  . atenolol (TENORMIN) tablet 25 mg  25 mg Oral Daily Rod Can, MD   25 mg at 04/16/20 0734  . Chlorhexidine Gluconate Cloth 2 % PADS 6 each  6 each Topical Daily Cristal Deer, MD      . docusate sodium (COLACE) capsule 100 mg  100 mg Oral BID Rod Can, MD   100 mg at 04/15/20 2050  . enoxaparin (LOVENOX) injection 40 mg  40 mg Subcutaneous Q24H Rod Can, MD   40 mg at 04/16/20 0734  . feeding supplement (KATE FARMS STANDARD 1.4) liquid 325 mL  325 mL Oral BID BM Rod Can, MD   325 mL at 04/16/20 1441  . HYDROcodone-acetaminophen (NORCO/VICODIN) 5-325 MG per tablet 1-2 tablet  1-2 tablet Oral Q4H PRN Cristal Deer, MD   2 tablet at 04/16/20 1143  . LORazepam (ATIVAN) tablet 0.5 mg  0.5 mg Oral QHS PRN Rod Can, MD   0.5 mg at 04/15/20 2206  . menthol-cetylpyridinium (CEPACOL) lozenge 3 mg  1  lozenge Oral PRN Rod Can, MD       Or  . phenol (CHLORASEPTIC) mouth spray 1 spray  1 spray Mouth/Throat PRN Swinteck, Aaron Edelman, MD      . metoCLOPramide (REGLAN) tablet 5-10 mg  5-10 mg Oral Q8H PRN Swinteck, Aaron Edelman, MD       Or  . metoCLOPramide (REGLAN) injection 5-10 mg  5-10 mg Intravenous Q8H PRN Swinteck, Aaron Edelman, MD      . morphine 2 MG/ML injection 0.5 mg  0.5 mg Intravenous Q2H PRN Rod Can, MD   0.5 mg at 04/15/20 2051  . multivitamin with minerals tablet 1 tablet  1 tablet Oral Daily Rod Can, MD   1 tablet at 04/16/20 0734  . ondansetron (ZOFRAN) tablet 4 mg  4 mg Oral Q6H PRN Swinteck,  Aaron Edelman, MD       Or  . ondansetron (ZOFRAN) injection 4 mg  4 mg Intravenous Q6H PRN Swinteck, Aaron Edelman, MD      . protein supplement (RESOURCE BENEPROTEIN) powder packet 6 g  1 Scoop Oral TID WC Rod Can, MD   6 g at 04/16/20 1143  . senna (SENOKOT) tablet 8.6 mg  1 tablet Oral BID Rod Can, MD   8.6 mg at 04/15/20 2050     Discharge Medications: Please see discharge summary for a list of discharge medications.  Relevant Imaging Results:  Relevant Lab Results:   Additional Information SS# [id number]  Adelene Amas, LCSWA

## 2020-04-16 NOTE — TOC Progression Note (Signed)
Transition of Care [facility]) - Progression Note    Patient Details  Name: Ashley Washington MRN: [id number] Date of Birth: 12-28-1932  Transition of Care [facility]) CM/SW [place], [place] Phone Number: [phone] 04/16/2020, 3:47 PM  Clinical Narrative:     CSW spoke with patient's Osborne Oman (Daughter) [phone] [facility]), about SNF recommendations from PT.  CSW explained the role of TOC in patient care and explained the SNF placement process and estimated timeline.  Ms. Loma Newton stated the patient's preference for SNF is [facility], and then [facility].  The patient currently has an active deposit at [facility] for ALF.  Ms. Loma Newton also stated their preferences include [facility] and [facility].  CSW stated I would make note of preferences but I could not promise specific placement.  Ms. Loma Newton verbalized understanding.  Fl2 sent for signature, PASRR# [id number].   Expected Discharge Plan: [place] Barriers to Discharge: Continued Medical Work up  Expected Discharge Plan and Services Expected Discharge Plan: [place] In-house Referral: Clinical Social Work     Living arrangements for the past 2 months: Single Family Home                                       Social Determinants of Health (SDOH) Interventions    Readmission Risk Interventions Readmission Risk Prevention Plan 04/14/2020  Post Dischage Appt Complete  Medication Screening Complete  Transportation Screening Complete  Some recent data might be hidden

## 2020-04-16 NOTE — Progress Notes (Signed)
     Subjective: 1 Day Post-Op Procedure(s) (LRB): INTRAMEDULLARY (IM) NAIL INTERTROCHANTRIC (Right)   Patient reports pain as mild, pain controlled with medication.  No reported events throughout the night.  A long discussion was had with the patient and her daughter with regards to the surgery, recovery and expectations moving forward.  Patient has some concern with regards to getting up and having pain as she did prior to surgery.  Depending on progress and believe the discharge plan will be for a skilled nursing facility for additional assistance that she might need.     Objective:   VITALS:   Vitals:   04/16/20 0518 04/16/20 0957  BP: (!) 170/75 119/61  Pulse: 72 61  Resp: 17 17  Temp: 98.9 F (37.2 C) 99 F (37.2 C)  SpO2: 92% 90%    Dorsiflexion/Plantar flexion intact Incision: dressing C/D/I No cellulitis present Compartment soft  LABS Recent Labs    04/14/20 0550 04/15/20 0309 04/16/20 0301  HGB 12.4 12.2 11.5*  HCT 37.9 37.4 34.7*  WBC 4.5 6.6 6.5  PLT 108* 113* 98*    Recent Labs    04/14/20 0550 04/15/20 0309 04/16/20 0301  NA 138 136 142  K 4.3 4.2 3.7  BUN 16 18 17   CREATININE 0.66 0.67 0.67  GLUCOSE 106* 95 97     Assessment/Plan: 1 Day Post-Op Procedure(s) (LRB): INTRAMEDULLARY (IM) NAIL INTERTROCHANTRIC (Right)   Advance diet Up with therapy Discharge disposition to be determined        Danae Orleans PA-C  [facility]  [place] [address], [place], [place] [address] Phone: [phone] [email or web address] Facebook  [employer]

## 2020-04-16 NOTE — Evaluation (Signed)
Physical Therapy Evaluation Patient Details Name: Ashley Washington MRN: [id number] DOB: 07/07/1932 Today's Date: 04/16/2020   History of Present Illness  Pt s/p fall with R intertrochanteric femur fx now s/p IM nailing.  Also with "possible S2 fx".  Pt with hx of sciatica and prior S2 fx per dtr  Clinical Impression  Pt admitted as above and presenting with functional mobility limitations 2* decreased R LE strength/ROM and post op pain severely exacerbated with attempts to mobilize.  Pt initially had hoped to return home with spouse but now open to idea of follow up rehab at SNF level to maximize IND and safety prior to dc home with elderly spouse.    Follow Up Recommendations SNF    Equipment Recommendations  None recommended by PT    Recommendations for Other Services OT consult     Precautions / Restrictions Precautions Precautions: Fall Restrictions Weight Bearing Restrictions: No RLE Weight Bearing: Weight bearing as tolerated      Mobility  Bed Mobility Overal bed mobility: Needs Assistance Bed Mobility: Supine to Sit;Sit to Supine     Supine to sit: Max assist;+2 for safety/equipment;+2 for physical assistance Sit to supine: Max assist;+2 for physical assistance;+2 for safety/equipment   General bed mobility comments: Increased time with cues for sequence and use of L LE to self assist.  Pt unable to achieve sitting upright 2* to pain.  Dtr states this was similar situation at home.    Transfers                 General transfer comment: Deferred 2* inability to achieve EOB sitting 2* pain  Ambulation/Gait                Stairs            Wheelchair Mobility    Modified Rankin (Stroke Patients Only)       Balance Overall balance assessment: Needs assistance Sitting-balance support: Bilateral upper extremity supported;Feet unsupported Sitting balance-Leahy Scale: Poor Sitting balance - Comments: pt leaning back to decrease pain                                      Pertinent Vitals/Pain Pain Assessment: Faces Pain Score: 10-Worst pain ever Faces Pain Scale: Hurts worst Pain Location: R hip with attempts to sit Pain Descriptors / Indicators: Grimacing;Guarding;Moaning Pain Intervention(s): Limited activity within patient's tolerance;Monitored during session;Premedicated before session;Ice applied    Home Living Family/patient expects to be discharged to:: Unsure Living Arrangements: Spouse/significant other Available Help at Discharge: Family Type of Home: House Home Access: Stairs to enter   Technical brewer of Steps: 1 Home Layout: One level Home Equipment: Environmental consultant - 2 wheels      Prior Function Level of Independence: Independent               Hand Dominance        Extremity/Trunk Assessment   Upper Extremity Assessment Upper Extremity Assessment: Overall WFL for tasks assessed    Lower Extremity Assessment Lower Extremity Assessment: RLE deficits/detail RLE Deficits / Details: 2-/5 strength at hip with AAROM tolerated to 80 hip flex and 15 abd       Communication   Communication: HOH  Cognition Arousal/Alertness: Awake/alert Behavior During Therapy: WFL for tasks assessed/performed Overall Cognitive Status: Within Functional Limits for tasks assessed  General Comments      Exercises General Exercises - Lower Extremity Ankle Circles/Pumps: AROM;Both;15 reps;Supine Heel Slides: AAROM;Right;15 reps;Supine Hip ABduction/ADduction: AAROM;Right;15 reps;Supine   Assessment/Plan    PT Assessment Patient needs continued PT services  PT Problem List Decreased range of motion;Decreased activity tolerance;Decreased balance;Decreased mobility;Decreased knowledge of use of DME;Pain       PT Treatment Interventions DME instruction;Gait training;Functional mobility training;Therapeutic activities;Therapeutic  exercise;Stair training;Patient/family education    PT Goals (Current goals can be found in the Care Plan section)  Acute Rehab PT Goals Patient Stated Goal: Regain IND PT Goal Formulation: With patient Time For Goal Achievement: 04/16/20 Potential to Achieve Goals: Fair    Frequency Min 5X/week   Barriers to discharge Decreased caregiver support Home with elderly spouse    Co-evaluation               AM-PAC PT "6 Clicks" Mobility  Outcome Measure Help needed turning from your back to your side while in a flat bed without using bedrails?: A Lot Help needed moving from lying on your back to sitting on the side of a flat bed without using bedrails?: A Lot Help needed moving to and from a bed to a chair (including a wheelchair)?: Total Help needed standing up from a chair using your arms (e.g., wheelchair or bedside chair)?: Total Help needed to walk in hospital room?: Total Help needed climbing 3-5 steps with a railing? : Total 6 Click Score: 8    End of Session   Activity Tolerance: Patient limited by pain Patient left: in bed;with call bell/phone within reach;with nursing/sitter in room;with family/visitor present Nurse Communication: Mobility status PT Visit Diagnosis: Difficulty in walking, not elsewhere classified (R26.2);History of falling (Z91.81);Pain Pain - Right/Left: Right Pain - part of body: Hip    Time: 0370-4888 PT Time Calculation (min) (ACUTE ONLY): 40 min   Charges:   PT Evaluation $PT Eval Low Complexity: 1 Low PT Treatments $Therapeutic Exercise: 8-22 mins $Therapeutic Activity: 8-22 mins        Debe Coder PT Acute Rehabilitation Services Pager [phone] Office [phone]   Arther Heisler 04/16/2020, 10:44 AM

## 2020-04-16 NOTE — Progress Notes (Signed)
Patient sleeping comfortably at this time, o2 sat is 95% on 2 liters nasal cannula, 5 ice packs are in place to right hip, scd's were turned off per patient request, iv fluids  infusing well, vss, will continue to monitor.

## 2020-04-16 NOTE — Plan of Care (Signed)
?  Problem: Education: ?Goal: Knowledge of General Education information will improve ?Description: Including pain rating scale, medication(s)/side effects and non-pharmacologic comfort measures ?Outcome: Progressing ?  ?Problem: Health Behavior/Discharge Planning: ?Goal: Ability to manage health-related needs will improve ?Outcome: Progressing ?  ?Problem: Coping: ?Goal: Level of anxiety will decrease ?Outcome: Progressing ?  ?

## 2020-04-16 NOTE — Progress Notes (Signed)
Patient sleeping soundly at this time, no acute distress noted, o2 sat is 95% on the continuous pulse ox, foley patent for clear yellow urine, vss, will continue to monitor.

## 2020-04-16 NOTE — Progress Notes (Signed)
PROGRESS NOTE  Ashley Washington [id number] DOB: 1932-10-11 DOA: 04/13/2020 PCP: Crist Infante, MD  HPI/Recap of past 24 hours: Patient was admitted last night with a fall at home.  Patient stated that she was hurrying to the bathroom and she tripped fell in the middle of the cord.  It was not the edge of the carpet that tripped her.  She was taken to the OR this morning with right hip fracture repair.  Subjective: April 16, 2020: Patient seen and examined at bedside stated she had a good night.  Her pain is under control.  PT evaluated patient and recommended SNF  April 15, 2020 Patient seen and examined at bedside she is complaining of pain in the right hip from the surgery also feels a little nervous requesting for a one-time dose of Xanax to help her relax.  Patient is hard of hearing.  Assessment/Plan: Principal Problem:   Closed right hip fracture, initial encounter [facility]) Active Problems:   Mitral valve disorder   Pulmonary HTN (HCC)   Hip fracture (HCC)   Malnutrition of moderate degree #1 right hip fracture secondary to mechanical fall status post repair April 15, 2020  2. Mechanical fall  3. Urinary incontinence office outpatient to be reevaluated by PCP to take if she needs to be started irrigation  4. Hard of hearing  5. Thrombocytopenia platelet is 113,000 today we'll continue to monitor  6. History of mitral valve prolapse followed by cardiology  7. Hypertension continue atenolol  8. Malnutrition of moderate degree. Patient has been started on supplements oral shake Code Status: Full  Severity of Illness: The appropriate patient status for this patient is INPATIENT. Inpatient status is judged to be reasonable and necessary in order to provide the required intensity of service to ensure the patient's safety. The patient's presenting symptoms, physical exam findings, and initial radiographic and laboratory data in the context of their chronic comorbidities  is felt to place them at high risk for further clinical deterioration. Furthermore, it is not anticipated that the patient will be medically stable for discharge from the hospital within 2 midnights of admission. The following factors support the patient status of inpatient.   " The patient's presenting symptoms include right hip pain. " The worrisome physical exam findings include right hip pain with fracture " The initial radiographic and laboratory data are worrisome because of fracture. " The chronic co-morbidities include fracture history of lumbar spinal stenosis.   * I certify that at the point of admission it is my clinical judgment that the patient will require inpatient hospital care spanning beyond 2 midnights from the point of admission due to high intensity of service, high risk for further deterioration and high frequency of surveillance required.*    Family Communication: Discussed with daughter Osborne Oman  Disposition Plan: Possible SNF   Consultants:  Orthopedic  Cardiology  Procedures:  Right hip fracture repair  Antimicrobials:  Ancef on route operating room  DVT prophylaxis: Lovenox   Objective: Vitals:   04/16/20 0212 04/16/20 0518 04/16/20 0957 04/16/20 1358  BP: (!) 160/68 (!) 170/75 119/61 (!) 153/63  Pulse: 65 72 61 63  Resp: 16 17 17 16   Temp: 98 F (36.7 C) 98.9 F (37.2 C) 99 F (37.2 C) 99.1 F (37.3 C)  TempSrc:   Oral Oral  SpO2: 96% 92% 90% 93%  Weight:      Height:        Intake/Output Summary (Last 24 hours) at 04/16/2020 1653 Last  data filed at 04/16/2020 1400 Gross per 24 hour  Intake 2567.41 ml  Output 2750 ml  Net -182.59 ml   Filed Weights   04/13/20 1711 04/14/20 0411  Weight: 48.9 kg 50.7 kg   Body mass index is 19.19 kg/m.  Exam:  . General: 87 y.o. year-old female well developed well nourished in no acute distress.  Alert and oriented x3.no  painful distress . Cardiovascular: Regular rate and rhythm with no  rubs or gallops.  No thyromegaly or JVD noted.   Marland Kitchen Respiratory: Clear to auscultation with no wheezes or rales. Good inspiratory effort. . Abdomen: Soft nontender nondistended with normal bowel sounds x4 quadrants. . Musculoskeletal: No lower extremity edema. 2/4 pulses in all 4 extremities.  Right hip pain tenderness . Skin: No ulcerative lesions noted or rashes, . Psychiatry: Mood is appropriate for condition and setting    Data Reviewed: CBC: Recent Labs  Lab 04/13/20 1946 04/14/20 0550 04/15/20 0309 04/16/20 0301  WBC 6.3 4.5 6.6 6.5  NEUTROABS 4.6  --   --   --   HGB 12.9 12.4 12.2 11.5*  HCT 38.8 37.9 37.4 34.7*  MCV 92.2 92.2 94.2 94.3  PLT 101* 108* 113* 98*   Basic Metabolic Panel: Recent Labs  Lab 04/13/20 1946 04/14/20 0550 04/15/20 0309 04/16/20 0301  NA 137 138 136 142  K 4.3 4.3 4.2 3.7  CL 102 104 106 106  CO2 25 25 25 26   GLUCOSE 103* 106* 95 97  BUN 17 16 18 17   CREATININE 0.58 0.66 0.67 0.67  CALCIUM 8.9 9.1 8.4* 8.4*  MG 2.1  --  2.3  --    GFR: Estimated Creatinine Clearance: 41.1 mL/min (by C-G formula based on SCr of 0.67 mg/dL). Liver Function Tests: Recent Labs  Lab 04/13/20 1946  AST 23  ALT 18  ALKPHOS 64  BILITOT 1.2  PROT 5.8*  ALBUMIN 3.6   No results for input(s): LIPASE, AMYLASE in the last 168 hours. No results for input(s): AMMONIA in the last 168 hours. Coagulation Profile: No results for input(s): INR, PROTIME in the last 168 hours. Cardiac Enzymes: No results for input(s): CKTOTAL, CKMB, CKMBINDEX, TROPONINI in the last 168 hours. BNP (last 3 results) No results for input(s): PROBNP in the last 8760 hours. HbA1C: No results for input(s): HGBA1C in the last 72 hours. CBG: No results for input(s): GLUCAP in the last 168 hours. Lipid Profile: No results for input(s): CHOL, HDL, LDLCALC, TRIG, CHOLHDL, LDLDIRECT in the last 72 hours. Thyroid Function Tests: No results for input(s): TSH, T4TOTAL, FREET4, T3FREE,  THYROIDAB in the last 72 hours. Anemia Panel: No results for input(s): VITAMINB12, FOLATE, FERRITIN, TIBC, IRON, RETICCTPCT in the last 72 hours. Urine analysis:    Component Value Date/Time   COLORURINE YELLOW 04/14/2020 0828   APPEARANCEUR CLEAR 04/14/2020 0828   LABSPEC 1.010 04/14/2020 0828   PHURINE 6.0 04/14/2020 0828   GLUCOSEU NEGATIVE 04/14/2020 0828   HGBUR NEGATIVE 04/14/2020 0828   BILIRUBINUR NEGATIVE 04/14/2020 0828   KETONESUR 20 (A) 04/14/2020 0828   PROTEINUR NEGATIVE 04/14/2020 0828   NITRITE NEGATIVE 04/14/2020 0828   LEUKOCYTESUR NEGATIVE 04/14/2020 0828   Sepsis Labs: @LABRCNTIP (procalcitonin:4,lacticidven:4)  ) Recent Results (from the past 240 hour(s))  SARS Coronavirus 2 by RT PCR (hospital order, performed in [facility] hospital lab) Nasopharyngeal Nasopharyngeal Swab     Status: None   Collection Time: 04/13/20 12:52 AM   Specimen: Nasopharyngeal Swab  Result Value Ref Range Status   SARS  Coronavirus 2 NEGATIVE NEGATIVE Final    Comment: (NOTE) SARS-CoV-2 target nucleic acids are NOT DETECTED.  The SARS-CoV-2 RNA is generally detectable in upper and lower respiratory specimens during the acute phase of infection. The lowest concentration of SARS-CoV-2 viral copies this assay can detect is 250 copies / mL. A negative result does not preclude SARS-CoV-2 infection and should not be used as the sole basis for treatment or other patient management decisions.  A negative result may occur with improper specimen collection / handling, submission of specimen other than nasopharyngeal swab, presence of viral mutation(s) within the areas targeted by this assay, and inadequate number of viral copies (<250 copies / mL). A negative result must be combined with clinical observations, patient history, and epidemiological information.  Fact Sheet for Patients:   StrictlyIdeas.no  Fact Sheet for Healthcare  Providers: BankingDealers.co.za  This test is not yet approved or  cleared by the [place] FDA and has been authorized for detection and/or diagnosis of SARS-CoV-2 by FDA under an Emergency Use Authorization (EUA).  This EUA will remain in effect (meaning this test can be used) for the duration of the COVID-19 declaration under Section 564(b)(1) of the Act, 21 U.S.C. section 360bbb-3(b)(1), unless the authorization is terminated or revoked sooner.  Performed at [facility], [place] [address] [place], [place] [address]   Urine culture     Status: None   Collection Time: 04/14/20  8:28 AM   Specimen: Urine, Catheterized  Result Value Ref Range Status   Specimen Description   Final    URINE, CATHETERIZED Performed at [place] [address], [place], [place] [address]    Special Requests   Final    NONE Performed at [facility], [place] [address], [place], [place] [address]    Culture   Final    NO GROWTH Performed at [facility], [place] [address] [place], [place] [address]    Report Status 04/15/2020 FINAL  Final  Surgical pcr screen     Status: None   Collection Time: 04/14/20  5:44 PM   Specimen: Nasal Mucosa; Nasal Swab  Result Value Ref Range Status   MRSA, PCR NEGATIVE NEGATIVE Final   Staphylococcus aureus NEGATIVE NEGATIVE Final    Comment: (NOTE) The Xpert SA Assay (FDA approved for NASAL specimens in patients 21 years of age and older), is one component of a comprehensive surveillance program. It is not intended to diagnose infection nor to guide or monitor treatment. Performed at [facility], [place] [address], [place], [place] [address]       Studies: No results found.  Scheduled Meds: . atenolol  25 mg Oral Daily  . Chlorhexidine Gluconate Cloth  6 each Topical Daily  . docusate sodium  100 mg Oral BID  . enoxaparin (LOVENOX) injection  40 mg  Subcutaneous Q24H  . feeding supplement (KATE FARMS STANDARD 1.4)  325 mL Oral BID BM  . multivitamin with minerals  1 tablet Oral Daily  . protein supplement  1 Scoop Oral TID WC  . senna  1 tablet Oral BID    Continuous Infusions: . sodium chloride Stopped (04/16/20 1100)     LOS: 3 days     Cristal Deer, MD [facility]  To reach me or the doctor on call, go to: [email or web address] Password [email or web address]  04/16/2020, 4:53 PM

## 2020-04-17 DIAGNOSIS — E440 Moderate protein-calorie malnutrition: Secondary | ICD-10-CM

## 2020-04-17 DIAGNOSIS — I059 Rheumatic mitral valve disease, unspecified: Secondary | ICD-10-CM | POA: Diagnosis not present

## 2020-04-17 DIAGNOSIS — I272 Pulmonary hypertension, unspecified: Secondary | ICD-10-CM | POA: Diagnosis not present

## 2020-04-17 DIAGNOSIS — S72001A Fracture of unspecified part of neck of right femur, initial encounter for closed fracture: Secondary | ICD-10-CM | POA: Diagnosis not present

## 2020-04-17 LAB — CBC
HCT: 35.4 % — ABNORMAL LOW (ref 36.0–46.0)
Hemoglobin: 11.9 g/dL — ABNORMAL LOW (ref 12.0–15.0)
MCH: 30.6 pg (ref 26.0–34.0)
MCHC: 33.6 g/dL (ref 30.0–36.0)
MCV: 91 fL (ref 80.0–100.0)
Platelets: 102 10*3/uL — ABNORMAL LOW (ref 150–400)
RBC: 3.89 MIL/uL (ref 3.87–5.11)
RDW: 13.4 % (ref 11.5–15.5)
WBC: 7.3 10*3/uL (ref 4.0–10.5)
nRBC: 0 % (ref 0.0–0.2)

## 2020-04-17 LAB — BASIC METABOLIC PANEL
Anion gap: 9 (ref 5–15)
BUN: 20 mg/dL (ref 8–23)
CO2: 26 mmol/L (ref 22–32)
Calcium: 8.4 mg/dL — ABNORMAL LOW (ref 8.9–10.3)
Chloride: 104 mmol/L (ref 98–111)
Creatinine, Ser: 0.54 mg/dL (ref 0.44–1.00)
GFR, Estimated: >60 mL/min (ref >60)
Glucose, Bld: 96 mg/dL (ref 70–99)
Potassium: 3.9 mmol/L (ref 3.5–5.1)
Sodium: 139 mmol/L (ref 135–145)

## 2020-04-17 MED ORDER — LORAZEPAM 0.5 MG PO TABS
0.5000 mg | ORAL_TABLET | Freq: Four times a day (QID) | ORAL | Status: DC | PRN
  Administered 2020-04-17 – 2020-04-19 (×5): 0.5 mg via ORAL
  Filled 2020-04-17 (×5): qty 1

## 2020-04-17 MED ORDER — POLYETHYLENE GLYCOL 3350 17 G PO PACK
17.0000 g | PACK | Freq: Every day | ORAL | Status: DC
  Administered 2020-04-17 – 2020-04-19 (×2): 17 g via ORAL
  Filled 2020-04-17 (×3): qty 1

## 2020-04-17 MED ORDER — HYDROCODONE-ACETAMINOPHEN 5-325 MG PO TABS: 1.0000 | ORAL_TABLET | ORAL | 0 refills | Status: AC | PRN

## 2020-04-17 MED ORDER — ASPIRIN 81 MG PO CHEW: 81.0000 mg | CHEWABLE_TABLET | Freq: Two times a day (BID) | ORAL | 0 refills | Status: AC

## 2020-04-17 NOTE — Progress Notes (Signed)
     Subjective: 2 Days Post-Op Procedure(s) (LRB): INTRAMEDULLARY (IM) NAIL INTERTROCHANTRIC (Right)   Patient reports pain as moderate.  No reported events throughout the night.  A long discussion was had with the patient and her daughter about disposition at discharge and concerns about mobility.  I explained that PT is currently recommending SNF but that if she gained more independence prior to discharge that home health may be an option.       Objective:   VITALS:   Vitals:   04/17/20 0113 04/17/20 0450  BP: (!) 153/61 (!) 168/81  Pulse: 74 78  Resp: 16 16  Temp: 98.6 F (37 C) 98.5 F (36.9 C)  SpO2: 94% 95%    Dorsiflexion/Plantar flexion intact Incision: dressing C/D/I No cellulitis present Compartment soft  LABS Recent Labs    04/15/20 0309 04/16/20 0301 04/17/20 0251  HGB 12.2 11.5* 11.9*  HCT 37.4 34.7* 35.4*  WBC 6.6 6.5 7.3  PLT 113* 98* 102*    Recent Labs    04/15/20 0309 04/16/20 0301 04/17/20 0251  NA 136 142 139  K 4.2 3.7 3.9  BUN 18 17 20   CREATININE 0.67 0.67 0.54  GLUCOSE 95 97 96     Assessment/Plan: 2 Days Post-Op Procedure(s) (LRB): INTRAMEDULLARY (IM) NAIL INTERTROCHANTRIC (Right)   Advance diet Up with therapy Discharge disposition to be determined        Cherlynn June PA-C [facility]  [place] [address], [place],  [address] Phone: [phone] [email or web address] Facebook  [employer]

## 2020-04-17 NOTE — Progress Notes (Signed)
Physical Therapy Treatment Patient Details Name: Ashley Washington MRN: [id number] DOB: 1932-05-20 Today's Date: 04/17/2020    History of Present Illness Pt s/p fall with R intertrochanteric femur fx now s/p IM nailing.  Also with "possible S2 fx".  Pt with hx of sciatica and prior S2 fx per dtr    PT Comments    Pt making great progress towards goals today, tolerating sitting position, transfer to stand, and very short distance gait to and from [facility]. Pt currently requiring mod-max assist for mobility tasks, but reports less pain vs evaluation yesterday. Pt tolerated standing position x10 minutes while PT performed pericare s/p BM and while waiting for RN to apply foam dressing to sacrum. Both pt and pt's daughter encouraged by pt progress today. Of note, pt with blisters around buttocks and in gluteal cleft. RN applied foam dressing to address, and PT encouraged weight shifting off sacrum every 2 hours for at least a few minutes for pressure relief, preferably lay in L sidelying with pillows under R hip. Pt and family understand, PT to continue to follow acutely.   Follow Up Recommendations  SNF     Equipment Recommendations  None recommended by PT    Recommendations for Other Services OT consult     Precautions / Restrictions Precautions Precautions: Fall Restrictions Weight Bearing Restrictions: No RLE Weight Bearing: Weight bearing as tolerated    Mobility  Bed Mobility Overal bed mobility: Needs Assistance Bed Mobility: Supine to Sit;Sit to Supine     Supine to sit: Max assist Sit to supine: Max assist;+2 for physical assistance   General bed mobility comments: max assist for trunk and LE management, benefits from use of bed pads to scoot hips to EOB.  Transfers Overall transfer level: Needs assistance Equipment used: Rolling walker (2 wheeled) Transfers: Sit to/from Stand Sit to Stand: Mod assist;+2 physical assistance         General transfer comment: Mod +2 for  power up, rise, and steady. Cues for weight bearing mostly through LLE to stand and sit, and then shift weight to R as tolerated. STS x2, from EOB and BSC.  Ambulation/Gait Ambulation/Gait assistance: Mod assist;+2 safety/equipment Gait Distance (Feet): 3 Feet Assistive device: Rolling walker (2 wheeled) Gait Pattern/deviations: Step-to pattern;Decreased step length - right;Trunk flexed Gait velocity: decr   General Gait Details: Mod assist to steady, step-by-step cuing for sequencing (lead with R, followed by L to offweight R as needed), room navigation. 3 ft distance completed x2, to and from [facility].   Stairs             Wheelchair Mobility    Modified Rankin (Stroke Patients Only)       Balance Overall balance assessment: Needs assistance Sitting-balance support: Bilateral upper extremity supported;Feet supported Sitting balance-Leahy Scale: Fair Sitting balance - Comments: able to sit EOB unsupported   Standing balance support: Bilateral upper extremity supported;During functional activity Standing balance-Leahy Scale: Poor Standing balance comment: reliant on external assist                            Cognition Arousal/Alertness: Awake/alert Behavior During Therapy: Anxious Overall Cognitive Status: Within Functional Limits for tasks assessed                                 General Comments: anxiety with mobility, benefits from short, simple mobility cues  Exercises General Exercises - Lower Extremity Hip Flexion/Marching: AROM;Both;5 reps;Standing    General Comments General comments (skin integrity, edema, etc.): Blisters along gluteal cleft, R buttocks - RN notified and placed a sacral pad      Pertinent Vitals/Pain Pain Assessment: 0-10 Pain Score: 7  Pain Location: R hip Pain Descriptors / Indicators: Grimacing;Guarding;Moaning;Sore;Discomfort Pain Intervention(s): Limited activity within patient's tolerance;Monitored  during session;Repositioned    Home Living                      Prior Function            PT Goals (current goals can now be found in the care plan section) Acute Rehab PT Goals Patient Stated Goal: Regain IND PT Goal Formulation: With patient Time For Goal Achievement: 04/16/20 Potential to Achieve Goals: Fair Progress towards PT goals: Progressing toward goals    Frequency    Min 3X/week      PT Plan Current plan remains appropriate    Co-evaluation              AM-PAC PT "6 Clicks" Mobility   Outcome Measure  Help needed turning from your back to your side while in a flat bed without using bedrails?: A Lot Help needed moving from lying on your back to sitting on the side of a flat bed without using bedrails?: A Lot Help needed moving to and from a bed to a chair (including a wheelchair)?: A Lot Help needed standing up from a chair using your arms (e.g., wheelchair or bedside chair)?: A Lot Help needed to walk in hospital room?: A Lot Help needed climbing 3-5 steps with a railing? : Total 6 Click Score: 11    End of Session   Activity Tolerance: Patient limited by pain Patient left: in bed;with call bell/phone within reach;with nursing/sitter in room;with family/visitor present Nurse Communication: Mobility status PT Visit Diagnosis: Difficulty in walking, not elsewhere classified (R26.2);History of falling (Z91.81);Pain Pain - Right/Left: Right Pain - part of body: Hip     Time: 8756-4332 PT Time Calculation (min) (ACUTE ONLY): 48 min  Charges:  $Gait Training: 8-22 mins $Therapeutic Activity: 23-37 mins                    Stacie Glaze, PT Acute Rehabilitation Services Pager [phone]  Office [phone]  Roxine Caddy E Ruffin Pyo 04/17/2020, 2:09 PM

## 2020-04-17 NOTE — Care Management Important Message (Signed)
Important Message  Patient Details IM Letter given to the Patient. Name: Ashley Washington MRN: [id number] Date of Birth: [birth year]   Medicare Important Message Given:  Yes     Kerin Salen 04/17/2020, 3:01 PM

## 2020-04-17 NOTE — Progress Notes (Signed)
PROGRESS NOTE  Ashley Washington [id number] DOB: Apr 21, 1932 DOA: 04/13/2020 PCP: Crist Infante, MD   LOS: 4 days   Brief narrative:  Ashley Washington is a 87 y.o. female with history of mitral valve prolapse and fibromuscular dysplasia of renal artery presented to hospital after sustaining a fall 4 days prior to presentation.  She was then taken to the ER where a CT scan did not show any acute fracture so was discharged home.  But she continued to have worsening pain and difficulty ambulating.  In the ED, MRI of the right hip and lumbar spine was done which showed nondisplaced intertrochanteric fracture of the right hip and possible fracture of the S2 vertebra.   On-call orthopedic surgeon Dr. Rolena Infante was consulted and patient admitted for further management.  Labs show platelets of 101 on admission, otherwise unremarkable.  Covid test was negative.  Patient was then admitted to the hospital for further evaluation.  Patient is status post hip repair on 04/15/2020.  Currently awaiting for skilled nursing facility placement.  Assessment/Plan:  Principal Problem:   Closed right hip fracture, initial encounter [facility]) Active Problems:   Mitral valve disorder   Pulmonary HTN (HCC)   Hip fracture (HCC)   Malnutrition of moderate degree   Right hip fracture with possible upper S2 fracture.  Evident on MRI of the hip.     Status post right hip repair on 04/15/2020.  Physical therapy has seen the patient.  Recommend skilled nursing facility placement.  Will need outpatient orthopedic follow-up   history of mitral valve prolapse, pulmonary hypertension, patient follows up with cardiology as outpatient.  Continue atenolol.    History of fibromuscular dysplasia of renal arteries.  Continue to monitor blood pressure.  Thrombocytopenia -mild, follow CBC.  Urinary incontinence.  Follow-up with PCP as outpatient  Moderate protein calorie malnutrition.  Present on admission.  Continue nutritional  supplements.  DVT prophylaxis: enoxaparin (LOVENOX) injection 40 mg Start: 04/16/20 0800 SCDs Start: 04/15/20 1116   Code Status: Full code  Family Communication: Spoke with the patient's daughter at bedside.  Status is: Inpatient  Remains inpatient appropriate because:IV treatments appropriate due to intensity of illness or inability to take PO, Inpatient level of care appropriate due to severity of illness and status post hip surgery awaiting for skilled nursing facility placement   Dispo: The patient is from: Home              Anticipated d/c is to skilled nursing facility              Anticipated d/c date is: 1 to 2 days, when bed available               Patient currently is medically stable to d/c.   Difficult to place patient No  Consultants:  Orthopedics  Cardiology  Procedures:  Right intramedullary fixation of the femur on 04/15/2020  Anti-infectives:  . None  Subjective: Today, patient was seen and examined at bedside.  Complains of constipation.  Mild hip pain at the surgical site.   Objective: Vitals:   04/17/20 0113 04/17/20 0450  BP: (!) 153/61 (!) 168/81  Pulse: 74 78  Resp: 16 16  Temp: 98.6 F (37 C) 98.5 F (36.9 C)  SpO2: 94% 95%    Intake/Output Summary (Last 24 hours) at 04/17/2020 0713 Last data filed at 04/17/2020 0332 Gross per 24 hour  Intake 1090.56 ml  Output 3200 ml  Net -2109.44 ml   Filed Weights   04/13/20  1711 04/14/20 0411  Weight: 48.9 kg 50.7 kg   Body mass index is 19.19 kg/m.   Physical Exam:  General: Thinly built, not in obvious distress HENT:   No scleral pallor or icterus noted. Oral mucosa is moist.  Chest:  Clear breath sounds.  Diminished breath sounds bilaterally. No crackles or wheezes.  CVS: S1 &S2 heard. No murmur.  Regular rate and rhythm. Abdomen: Soft, nontender, nondistended.  Bowel sounds are heard.   Extremities: No cyanosis, clubbing or edema.  Peripheral pulses are palpable.  Status post right  hip surgery with dressing. Psych: Alert, awake and oriented, normal mood, hard of hearing CNS:  No cranial nerve deficits.  Power equal in all extremities.   Skin: Warm and dry.  No rashes noted.   Data Review: I have personally reviewed the following laboratory data and studies,  CBC: Recent Labs  Lab 04/13/20 1946 04/14/20 0550 04/15/20 0309 04/16/20 0301 04/17/20 0251  WBC 6.3 4.5 6.6 6.5 7.3  NEUTROABS 4.6  --   --   --   --   HGB 12.9 12.4 12.2 11.5* 11.9*  HCT 38.8 37.9 37.4 34.7* 35.4*  MCV 92.2 92.2 94.2 94.3 91.0  PLT 101* 108* 113* 98* 297*   Basic Metabolic Panel: Recent Labs  Lab 04/13/20 1946 04/14/20 0550 04/15/20 0309 04/16/20 0301 04/17/20 0251  NA 137 138 136 142 139  K 4.3 4.3 4.2 3.7 3.9  CL 102 104 106 106 104  CO2 25 25 25 26 26   GLUCOSE 103* 106* 95 97 96  BUN 17 16 18 17 20   CREATININE 0.58 0.66 0.67 0.67 0.54  CALCIUM 8.9 9.1 8.4* 8.4* 8.4*  MG 2.1  --  2.3  --   --    Liver Function Tests: Recent Labs  Lab 04/13/20 1946  AST 23  ALT 18  ALKPHOS 64  BILITOT 1.2  PROT 5.8*  ALBUMIN 3.6   No results for input(s): LIPASE, AMYLASE in the last 168 hours. No results for input(s): AMMONIA in the last 168 hours. Cardiac Enzymes: No results for input(s): CKTOTAL, CKMB, CKMBINDEX, TROPONINI in the last 168 hours. BNP (last 3 results) No results for input(s): BNP in the last 8760 hours.  ProBNP (last 3 results) No results for input(s): PROBNP in the last 8760 hours.  CBG: No results for input(s): GLUCAP in the last 168 hours. Recent Results (from the past 240 hour(s))  SARS Coronavirus 2 by RT PCR (hospital order, performed in [facility] hospital lab) Nasopharyngeal Nasopharyngeal Swab     Status: None   Collection Time: 04/13/20 12:52 AM   Specimen: Nasopharyngeal Swab  Result Value Ref Range Status   SARS Coronavirus 2 NEGATIVE NEGATIVE Final    Comment: (NOTE) SARS-CoV-2 target nucleic acids are NOT DETECTED.  The SARS-CoV-2  RNA is generally detectable in upper and lower respiratory specimens during the acute phase of infection. The lowest concentration of SARS-CoV-2 viral copies this assay can detect is 250 copies / mL. A negative result does not preclude SARS-CoV-2 infection and should not be used as the sole basis for treatment or other patient management decisions.  A negative result may occur with improper specimen collection / handling, submission of specimen other than nasopharyngeal swab, presence of viral mutation(s) within the areas targeted by this assay, and inadequate number of viral copies (<250 copies / mL). A negative result must be combined with clinical observations, patient history, and epidemiological information.  Fact Sheet for Patients:   StrictlyIdeas.no  Fact Sheet for Healthcare Providers: BankingDealers.co.za  This test is not yet approved or  cleared by the [place] FDA and has been authorized for detection and/or diagnosis of SARS-CoV-2 by FDA under an Emergency Use Authorization (EUA).  This EUA will remain in effect (meaning this test can be used) for the duration of the COVID-19 declaration under Section 564(b)(1) of the Act, 21 U.S.C. section 360bbb-3(b)(1), unless the authorization is terminated or revoked sooner.  Performed at [facility], [place] [address], [place], [place] [address]   Urine culture     Status: None   Collection Time: 04/14/20  8:28 AM   Specimen: Urine, Catheterized  Result Value Ref Range Status   Specimen Description   Final    URINE, CATHETERIZED Performed at [place] [address], [place], [place] [address]    Special Requests   Final    NONE Performed at [facility], [place] [address], [place], [place] [address]    Culture   Final    NO GROWTH Performed at [facility], [place] [address], [place], [place] [address]     Report Status 04/15/2020 FINAL  Final  Surgical pcr screen     Status: None   Collection Time: 04/14/20  5:44 PM   Specimen: Nasal Mucosa; Nasal Swab  Result Value Ref Range Status   MRSA, PCR NEGATIVE NEGATIVE Final   Staphylococcus aureus NEGATIVE NEGATIVE Final    Comment: (NOTE) The Xpert SA Assay (FDA approved for NASAL specimens in patients 58 years of age and older), is one component of a comprehensive surveillance program. It is not intended to diagnose infection nor to guide or monitor treatment. Performed at [facility], [place] [address] [place],  [address]      Studies: Pelvis Portable  Result Date: 04/15/2020 CLINICAL DATA:  RIGHT intramedullary rod fixation EXAM: PORTABLE PELVIS 1-2 VIEWS COMPARISON:  April 09, 2020 FINDINGS: Status post RIGHT intramedullary rod fixation of the femur. Orthopedic hardware is intact and without periprosthetic fracture or lucency. Soft tissue air and overlying surgical staples. Osteopenia. No additional acute fracture or dislocation. Pelvic phleboliths. IMPRESSION: Expected postsurgical appearance status post RIGHT intramedullary rod fixation of the femur. Electronically Signed   By: Valentino Saxon MD   On: 04/15/2020 12:06   DG C-Arm 1-60 Min-No Report  Result Date: 04/15/2020 Fluoroscopy was utilized by the requesting physician.  No radiographic interpretation.   DG HIP OPERATIVE UNILAT W OR W/O PELVIS RIGHT  Result Date: 04/15/2020 CLINICAL DATA:  Right hip fracture. EXAM: OPERATIVE right HIP (WITH PELVIS IF PERFORMED) 3 VIEWS TECHNIQUE: Fluoroscopic spot image(s) were submitted for interpretation post-operatively. Radiation exposure index: 3.951 mGy. COMPARISON:  April 13, 2020. FINDINGS: Three intraoperative fluoroscopic images were obtained of the right hip. These images demonstrate surgical internal fixation of proximal right hip fracture as noted on prior MRI. IMPRESSION: Fluoroscopic guidance provided  during surgical internal fixation of proximal right femur. Electronically Signed   By: Marijo Conception M.D.   On: 04/15/2020 10:37      Flora Lipps, MD  [facility] 04/17/2020  If 7PM-7AM, please contact night-coverage

## 2020-04-18 DIAGNOSIS — S72001A Fracture of unspecified part of neck of right femur, initial encounter for closed fracture: Secondary | ICD-10-CM | POA: Diagnosis not present

## 2020-04-18 DIAGNOSIS — I272 Pulmonary hypertension, unspecified: Secondary | ICD-10-CM | POA: Diagnosis not present

## 2020-04-18 DIAGNOSIS — E440 Moderate protein-calorie malnutrition: Secondary | ICD-10-CM | POA: Diagnosis not present

## 2020-04-18 DIAGNOSIS — I059 Rheumatic mitral valve disease, unspecified: Secondary | ICD-10-CM | POA: Diagnosis not present

## 2020-04-18 LAB — BASIC METABOLIC PANEL
Anion gap: 7 (ref 5–15)
BUN: 29 mg/dL — ABNORMAL HIGH (ref 8–23)
CO2: 28 mmol/L (ref 22–32)
Calcium: 8.6 mg/dL — ABNORMAL LOW (ref 8.9–10.3)
Chloride: 103 mmol/L (ref 98–111)
Creatinine, Ser: 0.69 mg/dL (ref 0.44–1.00)
GFR, Estimated: >60 mL/min (ref >60)
Glucose, Bld: 96 mg/dL (ref 70–99)
Potassium: 4.2 mmol/L (ref 3.5–5.1)
Sodium: 138 mmol/L (ref 135–145)

## 2020-04-18 LAB — CBC
HCT: 36 % (ref 36.0–46.0)
Hemoglobin: 11.7 g/dL — ABNORMAL LOW (ref 12.0–15.0)
MCH: 30.9 pg (ref 26.0–34.0)
MCHC: 32.5 g/dL (ref 30.0–36.0)
MCV: 95 fL (ref 80.0–100.0)
Platelets: 126 10*3/uL — ABNORMAL LOW (ref 150–400)
RBC: 3.79 MIL/uL — ABNORMAL LOW (ref 3.87–5.11)
RDW: 13.6 % (ref 11.5–15.5)
WBC: 6.6 10*3/uL (ref 4.0–10.5)
nRBC: 0 % (ref 0.0–0.2)

## 2020-04-18 LAB — SARS CORONAVIRUS 2 (TAT 6-24 HRS): SARS Coronavirus 2: NEGATIVE

## 2020-04-18 LAB — MAGNESIUM: Magnesium: 2.1 mg/dL (ref 1.7–2.4)

## 2020-04-18 LAB — PHOSPHORUS: Phosphorus: 4.1 mg/dL (ref 2.5–4.6)

## 2020-04-18 MED ORDER — LORAZEPAM 0.5 MG PO TABS: ORAL_TABLET | ORAL | 0 refills | Status: AC

## 2020-04-18 MED ORDER — ONDANSETRON HCL 4 MG PO TABS: 4.0000 mg | ORAL_TABLET | Freq: Four times a day (QID) | ORAL | 0 refills | Status: DC | PRN

## 2020-04-18 MED ORDER — DOCUSATE SODIUM 100 MG PO CAPS: 100.0000 mg | ORAL_CAPSULE | Freq: Two times a day (BID) | ORAL | 0 refills | Status: DC | PRN

## 2020-04-18 MED ORDER — ALUM & MAG HYDROXIDE-SIMETH 200-200-20 MG/5ML PO SUSP: 15.0000 mL | ORAL | Status: DC | PRN

## 2020-04-18 MED ORDER — ALUM & MAG HYDROXIDE-SIMETH 200-200-20 MG/5 ML NICU TOPICAL: 1.0000 "application " | TOPICAL | Status: DC | PRN

## 2020-04-18 MED ORDER — POLYETHYLENE GLYCOL 3350 17 G PO PACK: 17.0000 g | PACK | Freq: Every day | ORAL | 0 refills | Status: DC | PRN

## 2020-04-18 NOTE — TOC Progression Note (Signed)
Transition of Care [facility]) - Progression Note    Patient Details  Name: Ashley Washington MRN: [id number] Date of Birth: [birth year]  Transition of Care [facility]) CM/SW Contact  Lennart Pall, LCSW Phone Number: 04/18/2020, 2:13 PM  Clinical Narrative:    Have received SNF bed offer from [facility] and have discussed with pt and daughter.  They have accepted offer, however, pt remains concerned about level of care she will receive at facility.  Have discussed through options at length and attempted to quell concerns.  Plan for admission today if the COVID results have been confirmed - still pending.   Expected Discharge Plan: [place] Barriers to Discharge: Continued Medical Work up  Expected Discharge Plan and Services Expected Discharge Plan: [place] In-house Referral: Clinical Social Work     Living arrangements for the past 2 months: Single Family Home Expected Discharge Date: 04/18/20                                     Social Determinants of Health (SDOH) Interventions    Readmission Risk Interventions Readmission Risk Prevention Plan 04/14/2020  Post Dischage Appt Complete  Medication Screening Complete  Transportation Screening Complete  Some recent data might be hidden

## 2020-04-18 NOTE — Progress Notes (Signed)
I have reviewed and concur with students documentation.  

## 2020-04-18 NOTE — Progress Notes (Signed)
Physical Therapy Treatment Patient Details Name: Ashley Washington MRN: [id number] DOB: 1932/10/16 Today's Date: 04/18/2020    History of Present Illness Pt s/p fall with R intertrochanteric femur fx now s/p IM nailing.  Also with "possible S2 fx".  Pt with hx of sciatica and prior S2 fx per dtr    PT Comments    Pt assisted with sit to stand from [facility] for practicing technique.  Pt also able to ambulate short distance within room today.  Continue to recommend SNF upon d/c.        Follow Up Recommendations  SNF     Equipment Recommendations  None recommended by PT    Recommendations for Other Services       Precautions / Restrictions Precautions Precautions: Fall Precaution Comments: does not tolerate sitting well due to pain in back and hip Restrictions Weight Bearing Restrictions: No RLE Weight Bearing: Weight bearing as tolerated    Mobility  Bed Mobility Overal bed mobility: Needs Assistance Bed Mobility: Supine to Sit;Sit to Supine     Supine to sit: Max assist;+2 for physical assistance Sit to supine: Max assist;+2 for physical assistance   General bed mobility comments: max assist for trunk and LE management, utilized bed pad for positioning  Transfers Overall transfer level: Needs assistance Equipment used: Rolling walker (2 wheeled) Transfers: Sit to/from Stand Sit to Stand: Mod assist;+2 physical assistance         General transfer comment: assist to rise and steady especially due to pain in sitting positioning, cues for hand placement and Rt LE forward for pain control, also practiced sit to stand from [facility] per daughter request  Ambulation/Gait Ambulation/Gait assistance: Min assist;+2 safety/equipment Gait Distance (Feet): 50 Feet Assistive device: Rolling walker (2 wheeled) Gait Pattern/deviations: Step-to pattern;Decreased step length - right;Trunk flexed;Antalgic Gait velocity: decr   General Gait Details: verbal cues for sequencing, RW  positioning, pt ambulated within room, very slow speed, distance to tolerance   Stairs             Wheelchair Mobility    Modified Rankin (Stroke Patients Only)       Balance                                            Cognition Arousal/Alertness: Awake/alert Behavior During Therapy: WFL for tasks assessed/performed Overall Cognitive Status: Within Functional Limits for tasks assessed                                 General Comments: benefits from short, simple mobility cues      Exercises      General Comments        Pertinent Vitals/Pain Pain Assessment: Faces Faces Pain Scale: Hurts whole lot Pain Location: R hip, bil "piriformis" and low back Pain Descriptors / Indicators: Grimacing;Guarding;Moaning;Sore;Discomfort Pain Intervention(s): Repositioned;Monitored during session    Home Living                      Prior Function            PT Goals (current goals can now be found in the care plan section) Acute Rehab PT Goals PT Goal Formulation: With patient Time For Goal Achievement: 04/25/20 Potential to Achieve Goals: Fair Progress towards PT goals: Progressing toward goals  Frequency    Min 3X/week      PT Plan Current plan remains appropriate    Co-evaluation              AM-PAC PT "6 Clicks" Mobility   Outcome Measure  Help needed turning from your back to your side while in a flat bed without using bedrails?: A Lot Help needed moving from lying on your back to sitting on the side of a flat bed without using bedrails?: A Lot Help needed moving to and from a bed to a chair (including a wheelchair)?: A Lot Help needed standing up from a chair using your arms (e.g., wheelchair or bedside chair)?: A Lot Help needed to walk in hospital room?: A Little Help needed climbing 3-5 steps with a railing? : A Lot 6 Click Score: 13    End of Session Equipment Utilized During Treatment: Gait  belt Activity Tolerance: Patient limited by pain Patient left: in bed;with call bell/phone within reach;with bed alarm set;with family/visitor present Nurse Communication: Mobility status PT Visit Diagnosis: Difficulty in walking, not elsewhere classified (R26.2);History of falling (Z91.81);Pain Pain - Right/Left: Right Pain - part of body: Hip     Time: 5638-9373 PT Time Calculation (min) (ACUTE ONLY): 39 min  Charges:  $Gait Training: 23-37 mins $Therapeutic Activity: 8-22 mins                    Jannette Spanner PT, DPT Acute Rehabilitation Services Pager: [phone] Office: [phone]  York Ram E 04/18/2020, 12:59 PM

## 2020-04-18 NOTE — Plan of Care (Signed)
  Problem: Clinical Measurements: Goal: Will remain free from infection Outcome: Progressing   Problem: Clinical Measurements: Goal: Diagnostic test results will improve Outcome: Progressing   Problem: Clinical Measurements: Goal: Cardiovascular complication will be avoided Outcome: Progressing   Problem: Coping: Goal: Level of anxiety will decrease Outcome: Progressing   Problem: Elimination: Goal: Will not experience complications related to bowel motility Outcome: Progressing   Problem: Safety: Goal: Ability to remain free from injury will improve Outcome: Progressing   Problem: Skin Integrity: Goal: Risk for impaired skin integrity will decrease Outcome: Progressing

## 2020-04-18 NOTE — Discharge Summary (Addendum)
Physician Discharge Summary  Ashley Washington QTM:[id number] DOB: 25-Oct-1932 DOA: 04/13/2020  PCP: Crist Infante, MD  Admit date: 04/13/2020 Discharge date: 04/19/2020  Admitted From: Home  Discharge disposition: Skilled nursing facility   Recommendations for Outpatient Follow-Up:   . Follow up with your primary care provider at the skilled nursing facility in 3 to 5 days . Check CBC, BMP, magnesium in the next visit . Follow-up with Dr. Lyla Glassing orthopedics in 2 weeks for wound check and hip surgery follow-up . Patient has been prescribed aspirin for DVT prophylaxis . Patient will need to be encouraged on physical therapy, consider adequate analgesia. . Patient might benefit from oral supplements as outpatient to improve her nutritional status . Patient chronically takes Ativan as needed for insomnia and anxiety.  Discharge Diagnosis:   Principal Problem:   Closed right hip fracture, initial encounter [facility]) Active Problems:   Mitral valve disorder   Pulmonary HTN (HCC)   Hip fracture (HCC)   Malnutrition of moderate degree  Discharge Condition: Improved.  Diet recommendation: Regular.  Wound care: Local hip site care  Code status: Full.   History of Present Illness:   Ashley Dilday Bullockis a 87 y.o.femalewithhistory of mitral valve prolapse and fibromuscular dysplasia of renal artery presented to hospital after sustaining a fall 4 days prior to presentation.  She was then taken to the ER where a CT scan did not show any acute fracture so was discharged home.  But she continued to have worsening pain and difficulty ambulating.  In the ED, MRI of the right hip and lumbar spine was done which showed nondisplaced intertrochanteric fracture of the right hip and possible fracture of the S2 vertebra.   On-call orthopedic surgeon Dr. Rolena Infante was consulted and patient admitted for further management. Labs show platelets of 101 on admission, otherwise unremarkable. Covid test was  negative.  Patient was then admitted to the hospital for further evaluation.  Hospital Course:   Following conditions were addressed during hospitalization as listed below,  Right hip fracture with possible upper S2 fracture.  Evident on MRI of the hip.    Status post right hip repair on 04/15/2020.  Physical therapy on board and recommend skilled nursing facility placement.  Will need outpatient orthopedic follow-up in 2 weeks for wound check of, surgical follow-up.  History of mitral valve prolapse, pulmonary hypertension, patient follows up with cardiology as outpatient.  Continue atenolol on discharge.Marland Kitchen    History of fibromuscular dysplasia of renal arteries.   on atenolol. Blood pressure seems to be stable  Thrombocytopenia-mild, follow CBC as outpatient.  Urinary incontinence.  Follow-up with PCP as outpatient  Moderate protein calorie malnutrition.  Present on admission.   Encourage nutritional supplements.  Disposition.  At this time, patient is stable for disposition to skilled nursing facility. Spoke with the patient's daughter at bedside regarding disposition.  Medical Consultants:    Orthopedics  Cardiology  Procedures:     Right intramedullary fixation of the femur on 04/15/2020  Subjective:   Today, patient and examined at bedside. States that she could not sleep well in the nighttime. Pain and mobility of the hip. Physical therapy recommended that she had improved  Discharge Exam:   Vitals with BMI 04/19/2020 04/18/2020 04/18/2020  Height - - -  Weight - - -  BMI - - -  Systolic [phone]  Diastolic 73 58 57  Pulse 66 62 64   General: Alert awake, not in obvious distress, hard of hearing HENT: pupils  equally reacting to light,  No scleral pallor or icterus noted. Oral mucosa is moist.  Chest:  Clear breath sounds.  Diminished breath sounds bilaterally. No crackles or wheezes.  CVS: S1 &S2 heard. No murmur.  Regular rate and rhythm. Abdomen: Soft,  nontender, nondistended.  Bowel sounds are heard.   Extremities: No cyanosis, clubbing or edema.  Peripheral pulses are palpable. Status post right hip surgery with ice packs. Psych: Alert, awake and oriented, normal mood CNS:  No cranial nerve deficits. Moving all extremities. Skin: Warm and dry.  No rashes noted.  The results of significant diagnostics from this hospitalization (including imaging, microbiology, ancillary and laboratory) are listed below for reference.     Diagnostic Studies:   MR LUMBAR SPINE WO CONTRAST  Result Date: 04/13/2020 CLINICAL DATA:  Low back pain.  Recent fall. EXAM: MRI LUMBAR SPINE WITHOUT CONTRAST TECHNIQUE: Multiplanar, multisequence MR imaging of the lumbar spine was performed. No intravenous contrast was administered. COMPARISON:  None. FINDINGS: Segmentation:  Standard Alignment: Grade 1 anterolisthesis at L3-4. There is new slight anterolisthesis at S1-2. Vertebrae: Curvilinear signal abnormality within the upper S2 body could be a minimally displaced fracture. No other acute abnormality. Heterogeneous bone marrow signal in general. Conus medullaris and cauda equina: Conus extends to the L1 level. Conus and cauda equina appear normal. Paraspinal and other soft tissues: Incompletely visualized large left renal cyst appears unchanged. Disc levels: L1-L2: Normal disc space and facet joints. No spinal canal stenosis. No neural foraminal stenosis. L2-L3: Disc space narrowing without herniation. No spinal canal stenosis. No neural foraminal stenosis. L3-L4: Moderate facet hypertrophy with small disc bulge, left asymmetric. Unchanged moderate spinal canal stenosis. Unchanged mild left neural foraminal stenosis. L4-L5: Mild facet hypertrophy. Normal disc. No spinal canal stenosis. No neural foraminal stenosis. L5-S1: Normal disc space and facet joints. No spinal canal stenosis. No neural foraminal stenosis. Visualized sacrum: Normal. IMPRESSION: 1. Possible minimally  displaced fracture of the upper S2 body. Consider CT of the sacrum for further evaluation. 2. Unchanged moderate L3-4 spinal canal stenosis. 3. Multilevel facet arthrosis, which may be a source of local low back pain. Electronically Signed   By: Ulyses Jarred M.D.   On: 04/13/2020 22:03   MR HIP RIGHT WO CONTRAST  Result Date: 04/13/2020 CLINICAL DATA:  Negative x-ray, trauma EXAM: MR OF THE RIGHT HIP WITHOUT CONTRAST TECHNIQUE: Multiplanar, multisequence MR imaging was performed. No intravenous contrast was administered. COMPARISON:  None. FINDINGS: Bones: There is a nondisplaced incomplete intratrochanteric fracture extending from the greater trochanter medially. Surrounding marrow edema is noted. The femoral head is normal in shape, configuration and sphericity. No osseous bump at the femoral head neck junction. The visualized bony pelvis appears normal. The visualized sacroiliac joints and symphysis pubis appear normal. Articular cartilage and labrum Articular cartilage: Mild superior joint space loss and chondral thinning is noted. The ligamentum teres is intact Labrum: There is no gross labral tear or paralabral abnormality. Joint or bursal effusion Joint effusion: A small hip joint effusion is seen. Bursae: No focal periarticular fluid collection. Muscles and tendons Muscles and tendons: There is increased feathery signal seen within the obturator externus and adductor musculature bilaterally. Increased signal seen around the tensor fascia lata and the insertion site of the gluteal tendons. There is mildly increased signal at the hamstrings tendons. There is also increased signal seen within the iliopsoas muscle belly. The piriformis muscles appear symmetric. Other findings Miscellaneous: The visualized internal pelvic contents appear unremarkable. IMPRESSION: Nondisplaced incomplete intratrochanteric fracture extending  from the greater trochanter with surrounding marrow edema. Mild to moderate  femoroacetabular joint osteoarthritis Diffuse muscular edema surrounding the hip. Insertional gluteal and hamstrings tendinosis Small hip joint effusion Electronically Signed   By: Prudencio Pair M.D.   On: 04/13/2020 21:49   ECHOCARDIOGRAM COMPLETE  Result Date: 04/14/2020    ECHOCARDIOGRAM REPORT   Patient Name:   Ashley Washington Date of Exam: 04/14/2020 Medical Rec #:  [id number]         Height:       64.0 in Accession #:    [id number]        Weight:       111.8 lb Date of Birth:  12/19/1932         BSA:          1.528 m Patient Age:    32 years          BP:           125/64 mmHg Patient Gender: F                 HR:           56 bpm. Exam Location:  Inpatient Procedure: 2D Echo, Cardiac Doppler and Color Doppler Indications:    R06.02 SOB  History:        Patient has prior history of Echocardiogram examinations, most                 recent 11/23/2018. Abnormal ECG, Pulmonary HTN;                 Signs/Symptoms:Chest Pain.  Sonographer:    Roseanna Rainbow RDCS Referring Phys: [id number] [facility] [place]  Sonographer Comments: Technically difficult study due to poor echo windows. Patient supine and could not turn due to broken hip. IMPRESSIONS  1. Left ventricular ejection fraction, by estimation, is 60 to 65%. The left ventricle has normal function. The left ventricle has no regional wall motion abnormalities. There is mild left ventricular hypertrophy. Left ventricular diastolic parameters are consistent with Grade I diastolic dysfunction (impaired relaxation).  2. Right ventricular systolic function is normal. The right ventricular size is normal. There is severely elevated pulmonary artery systolic pressure. The estimated right ventricular systolic pressure is 24.5 mmHg.  3. The pericardial effusion is circumferential.  4. The mitral valve is normal in structure. Trivial mitral valve regurgitation. No evidence of mitral stenosis.  5. The aortic valve is normal in structure. Aortic valve regurgitation is not visualized.  No aortic stenosis is present.  6. The inferior vena cava is normal in size with greater than 50% respiratory variability, suggesting right atrial pressure of 3 mmHg. Comparison(s): No significant change from prior study. Prior images reviewed side by side. FINDINGS  Left Ventricle: Left ventricular ejection fraction, by estimation, is 60 to 65%. The left ventricle has normal function. The left ventricle has no regional wall motion abnormalities. The left ventricular internal cavity size was normal in size. There is  mild left ventricular hypertrophy. Left ventricular diastolic parameters are consistent with Grade I diastolic dysfunction (impaired relaxation). Right Ventricle: The right ventricular size is normal. No increase in right ventricular wall thickness. Right ventricular systolic function is normal. There is severely elevated pulmonary artery systolic pressure. The tricuspid regurgitant velocity is 3.36 m/s, and with an assumed right atrial pressure of 15 mmHg, the estimated right ventricular systolic pressure is 80.9 mmHg. Left Atrium: Left atrial size was normal in size. Right Atrium: Right atrial size was normal in  size. Pericardium: Trivial pericardial effusion is present. The pericardial effusion is circumferential. Mitral Valve: The mitral valve is normal in structure. Trivial mitral valve regurgitation. No evidence of mitral valve stenosis. Tricuspid Valve: The tricuspid valve is normal in structure. Tricuspid valve regurgitation is mild . No evidence of tricuspid stenosis. Aortic Valve: The aortic valve is normal in structure. Aortic valve regurgitation is not visualized. No aortic stenosis is present. Aortic valve mean gradient measures 6.0 mmHg. Aortic valve peak gradient measures 14.0 mmHg. Aortic valve area, by VTI measures 1.90 cm. Pulmonic Valve: The pulmonic valve was normal in structure. Pulmonic valve regurgitation is not visualized. No evidence of pulmonic stenosis. Aorta: The aortic root  is normal in size and structure. Venous: The inferior vena cava is normal in size with greater than 50% respiratory variability, suggesting right atrial pressure of 3 mmHg. IAS/Shunts: No atrial level shunt detected by color flow Doppler.  LEFT VENTRICLE PLAX 2D LVIDd:         2.90 cm     Diastology LVIDs:         1.20 cm     LV e' medial:    5.55 cm/s LV PW:         1.40 cm     LV E/e' medial:  15.1 LV IVS:        1.00 cm     LV e' lateral:   8.59 cm/s LVOT diam:     1.80 cm     LV E/e' lateral: 9.8 LV SV:         77 LV SV Index:   51 LVOT Area:     2.54 cm  LV Volumes (MOD) LV vol d, MOD A2C: 58.5 ml LV vol d, MOD A4C: 50.2 ml LV vol s, MOD A2C: 14.3 ml LV vol s, MOD A4C: 16.0 ml LV SV MOD A2C:     44.2 ml LV SV MOD A4C:     50.2 ml LV SV MOD BP:      37.5 ml RIGHT VENTRICLE            IVC RV S prime:     9.14 cm/s  IVC diam: 2.90 cm TAPSE (M-mode): 2.8 cm LEFT ATRIUM           Index       RIGHT ATRIUM           Index LA diam:      3.10 cm 2.03 cm/m  RA Area:     12.90 cm LA Vol (A2C): 10.8 ml 7.07 ml/m  RA Volume:   27.30 ml  17.87 ml/m LA Vol (A4C): 41.2 ml 26.97 ml/m  AORTIC VALVE AV Area (Vmax):    2.03 cm AV Area (Vmean):   2.05 cm AV Area (VTI):     1.90 cm AV Vmax:           187.00 cm/s AV Vmean:          112.000 cm/s AV VTI:            0.408 m AV Peak Grad:      14.0 mmHg AV Mean Grad:      6.0 mmHg LVOT Vmax:         149.00 cm/s LVOT Vmean:        90.100 cm/s LVOT VTI:          0.304 m LVOT/AV VTI ratio: 0.75  AORTA Ao Root diam: 2.20 cm MITRAL VALVE  TRICUSPID VALVE MV Area (PHT): 2.54 cm    TR Peak grad:   45.2 mmHg MV Decel Time: 299 msec    TR Vmax:        336.00 cm/s MV E velocity: 84.00 cm/s MV A velocity: 81.80 cm/s  SHUNTS MV E/A ratio:  1.03        Systemic VTI:  0.30 m                            Systemic Diam: 1.80 cm Candee Furbish MD Electronically signed by Candee Furbish MD Signature Date/Time: 04/14/2020/2:35:47 PM    Final      Labs:   Basic Metabolic Panel: Recent  Labs  Lab 04/13/20 1946 04/14/20 0550 04/15/20 0309 04/16/20 0301 04/17/20 0251 04/18/20 0313  NA 137 138 136 142 139 138  K 4.3 4.3 4.2 3.7 3.9 4.2  CL 102 104 106 106 104 103  CO2 25 25 25 26 26 28   GLUCOSE 103* 106* 95 97 96 96  BUN 17 16 18 17 20  29*  CREATININE 0.58 0.66 0.67 0.67 0.54 0.69  CALCIUM 8.9 9.1 8.4* 8.4* 8.4* 8.6*  MG 2.1  --  2.3  --   --  2.1  PHOS  --   --   --   --   --  4.1   GFR Estimated Creatinine Clearance: 41.1 mL/min (by C-G formula based on SCr of 0.69 mg/dL). Liver Function Tests: Recent Labs  Lab 04/13/20 1946  AST 23  ALT 18  ALKPHOS 64  BILITOT 1.2  PROT 5.8*  ALBUMIN 3.6   No results for input(s): LIPASE, AMYLASE in the last 168 hours. No results for input(s): AMMONIA in the last 168 hours. Coagulation profile No results for input(s): INR, PROTIME in the last 168 hours.  CBC: Recent Labs  Lab 04/13/20 1946 04/14/20 0550 04/15/20 0309 04/16/20 0301 04/17/20 0251 04/18/20 0313  WBC 6.3 4.5 6.6 6.5 7.3 6.6  NEUTROABS 4.6  --   --   --   --   --   HGB 12.9 12.4 12.2 11.5* 11.9* 11.7*  HCT 38.8 37.9 37.4 34.7* 35.4* 36.0  MCV 92.2 92.2 94.2 94.3 91.0 95.0  PLT 101* 108* 113* 98* 102* 126*   Cardiac Enzymes: No results for input(s): CKTOTAL, CKMB, CKMBINDEX, TROPONINI in the last 168 hours. BNP: Invalid input(s): POCBNP CBG: No results for input(s): GLUCAP in the last 168 hours. D-Dimer No results for input(s): DDIMER in the last 72 hours. Hgb A1c No results for input(s): HGBA1C in the last 72 hours. Lipid Profile No results for input(s): CHOL, HDL, LDLCALC, TRIG, CHOLHDL, LDLDIRECT in the last 72 hours. Thyroid function studies No results for input(s): TSH, T4TOTAL, T3FREE, THYROIDAB in the last 72 hours.  Invalid input(s): FREET3 Anemia work up No results for input(s): VITAMINB12, FOLATE, FERRITIN, TIBC, IRON, RETICCTPCT in the last 72 hours. Microbiology Recent Results (from the past 240 hour(s))  SARS  Coronavirus 2 by RT PCR (hospital order, performed in [facility] hospital lab) Nasopharyngeal Nasopharyngeal Swab     Status: None   Collection Time: 04/13/20 12:52 AM   Specimen: Nasopharyngeal Swab  Result Value Ref Range Status   SARS Coronavirus 2 NEGATIVE NEGATIVE Final    Comment: (NOTE) SARS-CoV-2 target nucleic acids are NOT DETECTED.  The SARS-CoV-2 RNA is generally detectable in upper and lower respiratory specimens during the acute phase of infection. The lowest concentration of SARS-CoV-2 viral copies  this assay can detect is 250 copies / mL. A negative result does not preclude SARS-CoV-2 infection and should not be used as the sole basis for treatment or other patient management decisions.  A negative result may occur with improper specimen collection / handling, submission of specimen other than nasopharyngeal swab, presence of viral mutation(s) within the areas targeted by this assay, and inadequate number of viral copies (<250 copies / mL). A negative result must be combined with clinical observations, patient history, and epidemiological information.  Fact Sheet for Patients:   StrictlyIdeas.no  Fact Sheet for Healthcare Providers: BankingDealers.co.za  This test is not yet approved or  cleared by the [place] FDA and has been authorized for detection and/or diagnosis of SARS-CoV-2 by FDA under an Emergency Use Authorization (EUA).  This EUA will remain in effect (meaning this test can be used) for the duration of the COVID-19 declaration under Section 564(b)(1) of the Act, 21 U.S.C. section 360bbb-3(b)(1), unless the authorization is terminated or revoked sooner.  Performed at [facility], [place] [address], [place], [place] [address]   Urine culture     Status: None   Collection Time: 04/14/20  8:28 AM   Specimen: Urine, Catheterized  Result Value Ref Range Status   Specimen Description    Final    URINE, CATHETERIZED Performed at [facility] [address] [place], [place] [address]    Special Requests   Final    NONE Performed at [facility], [place] [address], [place], [place] [address]    Culture   Final    NO GROWTH Performed at [facility], [place] [address], [place], [place] [address]    Report Status 04/15/2020 FINAL  Final  Surgical pcr screen     Status: None   Collection Time: 04/14/20  5:44 PM   Specimen: Nasal Mucosa; Nasal Swab  Result Value Ref Range Status   MRSA, PCR NEGATIVE NEGATIVE Final   Staphylococcus aureus NEGATIVE NEGATIVE Final    Comment: (NOTE) The Xpert SA Assay (FDA approved for NASAL specimens in patients 7 years of age and older), is one component of a comprehensive surveillance program. It is not intended to diagnose infection nor to guide or monitor treatment. Performed at [facility], [place] [address], [place],  [address]      Discharge Instructions:   Discharge Instructions    Call MD for:  redness, tenderness, or signs of infection (pain, swelling, redness, odor or green/yellow discharge around incision site)   Complete by: As directed    Call MD for:  severe uncontrolled pain   Complete by: As directed    Call MD for:  temperature >100.4   Complete by: As directed    Diet general   Complete by: As directed    Discharge instructions   Complete by: As directed    Follow-up with the orthopedic surgery for postsurgical follow-up in 2 weeks.. Continue rehabilitation after discharge.   Discharge wound care:   Complete by: As directed    Local surgical site care   Increase activity slowly   Complete by: As directed      Allergies as of 04/18/2020      Reactions   Doxycycline Other (See Comments)   Eye irritation   Fluorometholone Other (See Comments)   Eye irritation due to preservatives   Other    Antibiotics - causes GI upset   Pilocarpine  Other (See Comments)  Caused blood pressure to drop so low they almost had to call EMS      Medication List    STOP taking these medications   traMADol 50 MG tablet Commonly known as: ULTRAM     TAKE these medications   aspirin 81 MG chewable tablet Commonly known as: Aspirin Childrens Chew 1 tablet (81 mg total) by mouth 2 (two) times daily with a meal.   atenolol 25 MG tablet Commonly known as: TENORMIN 1/2 tab po qd What changed:   how much to take  how to take this  when to take this  additional instructions   docusate sodium 100 MG capsule Commonly known as: COLACE Take 1 capsule (100 mg total) by mouth 2 (two) times daily as needed for mild constipation.   HYDROcodone-acetaminophen 5-325 MG tablet Commonly known as: NORCO/VICODIN Take 1-2 tablets by mouth every 4 (four) hours as needed for up to 7 days for moderate pain.   LORazepam 0.5 MG tablet Commonly known as: ATIVAN May take 1 tablet (0.5 mg total) by mouth every 6 (six) hours as needed for anxiety or sleep. May also take 2 tablets (1 mg total) at bedtime as needed for anxiety or sleep. What changed: See the new instructions.   metroNIDAZOLE 0.75 % gel Commonly known as: METROGEL Apply 1 application topically 2 (two) times daily.   nitroGLYCERIN 0.4 MG SL tablet Commonly known as: NITROSTAT Place 1 tablet (0.4 mg total) under the tongue every 5 (five) minutes as needed for chest pain.   ondansetron 4 MG tablet Commonly known as: ZOFRAN Take 1 tablet (4 mg total) by mouth every 6 (six) hours as needed for nausea or vomiting.   polyethylene glycol 17 g packet Commonly known as: MIRALAX / GLYCOLAX Take 17 g by mouth daily as needed for moderate constipation or severe constipation.   Systane Complete 0.6 % Soln Generic drug: Propylene Glycol Apply 1 drop to eye daily as needed (dry eyes).            Discharge Care Instructions  (From admission, onward)         Start     Ordered    04/18/20 0000  Discharge wound care:       Comments: Local surgical site care   04/18/20 1207          Follow-up Information    Swinteck, Aaron Edelman, MD. Schedule an appointment as soon as possible for a visit in 2 weeks.   Specialty: Orthopedic Surgery Why: For suture removal Contact information: [address] [place] [address] [phone]                Time coordinating discharge: 39 minutes  Signed:  Mercia Dowe  [facility] 04/18/2020, 12:08 PM

## 2020-04-18 NOTE — Progress Notes (Incomplete)
PROGRESS NOTE  Ashley Washington HYQ:[id number] DOB: 09/29/1932 DOA: 04/13/2020 PCP: Crist Infante, MD   LOS: 5 days   Brief narrative:  Ashley Washington is a 87 y.o. female with history of mitral valve prolapse and fibromuscular dysplasia of renal artery presented to hospital after sustaining a fall 4 days prior to presentation.  She was then taken to the ER where a CT scan did not show any acute fracture so was discharged home.  But she continued to have worsening pain and difficulty ambulating.  In the ED, MRI of the right hip and lumbar spine was done which showed nondisplaced intertrochanteric fracture of the right hip and possible fracture of the S2 vertebra.   On-call orthopedic surgeon Dr. Rolena Infante was consulted and patient admitted for further management.  Labs show platelets of 101 on admission, otherwise unremarkable.  Covid test was negative.  Patient was then admitted to the hospital for further evaluation.  Patient is status post hip repair on 04/15/2020.  Currently awaiting for skilled nursing facility placement.  Assessment/Plan:  Principal Problem:   Closed right hip fracture, initial encounter [facility]) Active Problems:   Mitral valve disorder   Pulmonary HTN (HCC)   Hip fracture (HCC)   Malnutrition of moderate degree   Right hip fracture with possible upper S2 fracture.  Evident on MRI of the hip.     Status post right hip repair on 04/15/2020.  Physical therapy on board and recommend skilled nursing facility placement.  Will need outpatient orthopedic follow-up   History of mitral valve prolapse, pulmonary hypertension, patient follows up with cardiology as outpatient.  Continue atenolol.    History of fibromuscular dysplasia of renal arteries.  Continue to monitor blood pressure.  Thrombocytopenia -mild, follow CBC.  Urinary incontinence.  Follow-up with PCP as outpatient  Moderate protein calorie malnutrition.  Present on admission.  Continue nutritional  supplements.  DVT prophylaxis: enoxaparin (LOVENOX) injection 40 mg Start: 04/16/20 0800 SCDs Start: 04/15/20 1116   Code Status: Full code  Family Communication: ??  Spoke with the patient's daughter at bedside.  Status is: Inpatient  Remains inpatient appropriate because:IV treatments appropriate due to intensity of illness or inability to take PO, Inpatient level of care appropriate due to severity of illness and status post hip surgery awaiting for skilled nursing facility placement   Dispo: The patient is from: Home              Anticipated d/c is to skilled nursing facility              Anticipated d/c date is: 1 to 2 days, when bed available              Patient currently is medically stable to d/c.   Difficult to place patient No  Consultants:  Orthopedics  Cardiology  Procedures:  Right intramedullary fixation of the femur on 04/15/2020  Anti-infectives:  . None  Subjective: Today, patient was seen and examined at bedside.    \??Patient was seen and examined at bedside.  Complains of constipation.  Mild hip pain at the surgical site.   Objective: Vitals:   04/17/20 2055 04/18/20 0554  BP: (!) 145/59 133/61  Pulse: 64 65  Resp: 14 14  Temp: 98 F (36.7 C) 98.2 F (36.8 C)  SpO2: 95% 96%    Intake/Output Summary (Last 24 hours) at 04/18/2020 0721 Last data filed at 04/18/2020 9528 Gross per 24 hour  Intake 600 ml  Output 600 ml  Net 0  ml   Filed Weights   04/13/20 1711 04/14/20 0411  Weight: 48.9 kg 50.7 kg   Body mass index is 19.19 kg/m.   Physical Exam: ??General: Thinly built, not in obvious distress HENT:   No scleral pallor or icterus noted. Oral mucosa is moist.  Chest:  Clear breath sounds.  Diminished breath sounds bilaterally. No crackles or wheezes.  CVS: S1 &S2 heard. No murmur.  Regular rate and rhythm. Abdomen: Soft, nontender, nondistended.  Bowel sounds are heard.   Extremities: No cyanosis, clubbing or edema.  Peripheral  pulses are palpable.  Status post right hip surgery with dressing. Psych: Alert, awake and oriented, normal mood, hard of hearing CNS:  No cranial nerve deficits.  Power equal in all extremities.   Skin: Warm and dry.  No rashes noted.   Data Review: I have personally reviewed the following laboratory data and studies,  CBC: Recent Labs  Lab 04/13/20 1946 04/14/20 0550 04/15/20 0309 04/16/20 0301 04/17/20 0251 04/18/20 0313  WBC 6.3 4.5 6.6 6.5 7.3 6.6  NEUTROABS 4.6  --   --   --   --   --   HGB 12.9 12.4 12.2 11.5* 11.9* 11.7*  HCT 38.8 37.9 37.4 34.7* 35.4* 36.0  MCV 92.2 92.2 94.2 94.3 91.0 95.0  PLT 101* 108* 113* 98* 102* 283*   Basic Metabolic Panel: Recent Labs  Lab 04/13/20 1946 04/14/20 0550 04/15/20 0309 04/16/20 0301 04/17/20 0251 04/18/20 0313  NA 137 138 136 142 139 138  K 4.3 4.3 4.2 3.7 3.9 4.2  CL 102 104 106 106 104 103  CO2 25 25 25 26 26 28   GLUCOSE 103* 106* 95 97 96 96  BUN 17 16 18 17 20  29*  CREATININE 0.58 0.66 0.67 0.67 0.54 0.69  CALCIUM 8.9 9.1 8.4* 8.4* 8.4* 8.6*  MG 2.1  --  2.3  --   --  2.1  PHOS  --   --   --   --   --  4.1   Liver Function Tests: Recent Labs  Lab 04/13/20 1946  AST 23  ALT 18  ALKPHOS 64  BILITOT 1.2  PROT 5.8*  ALBUMIN 3.6   No results for input(s): LIPASE, AMYLASE in the last 168 hours. No results for input(s): AMMONIA in the last 168 hours. Cardiac Enzymes: No results for input(s): CKTOTAL, CKMB, CKMBINDEX, TROPONINI in the last 168 hours. BNP (last 3 results) No results for input(s): BNP in the last 8760 hours.  ProBNP (last 3 results) No results for input(s): PROBNP in the last 8760 hours.  CBG: No results for input(s): GLUCAP in the last 168 hours. Recent Results (from the past 240 hour(s))  SARS Coronavirus 2 by RT PCR (hospital order, performed in [facility] hospital lab) Nasopharyngeal Nasopharyngeal Swab     Status: None   Collection Time: 04/13/20 12:52 AM   Specimen: Nasopharyngeal  Swab  Result Value Ref Range Status   SARS Coronavirus 2 NEGATIVE NEGATIVE Final    Comment: (NOTE) SARS-CoV-2 target nucleic acids are NOT DETECTED.  The SARS-CoV-2 RNA is generally detectable in upper and lower respiratory specimens during the acute phase of infection. The lowest concentration of SARS-CoV-2 viral copies this assay can detect is 250 copies / mL. A negative result does not preclude SARS-CoV-2 infection and should not be used as the sole basis for treatment or other patient management decisions.  A negative result may occur with improper specimen collection / handling, submission of specimen other than  nasopharyngeal swab, presence of viral mutation(s) within the areas targeted by this assay, and inadequate number of viral copies (<250 copies / mL). A negative result must be combined with clinical observations, patient history, and epidemiological information.  Fact Sheet for Patients:   StrictlyIdeas.no  Fact Sheet for Healthcare Providers: BankingDealers.co.za  This test is not yet approved or  cleared by the [place] FDA and has been authorized for detection and/or diagnosis of SARS-CoV-2 by FDA under an Emergency Use Authorization (EUA).  This EUA will remain in effect (meaning this test can be used) for the duration of the COVID-19 declaration under Section 564(b)(1) of the Act, 21 U.S.C. section 360bbb-3(b)(1), unless the authorization is terminated or revoked sooner.  Performed at [facility], [place] [address], [place], [place] [address]   Urine culture     Status: None   Collection Time: 04/14/20  8:28 AM   Specimen: Urine, Catheterized  Result Value Ref Range Status   Specimen Description   Final    URINE, CATHETERIZED Performed at [facility] [address] [place], [place] [address]    Special Requests   Final    NONE Performed at [facility], [place] [address], [place], [place] [address]    Culture   Final    NO GROWTH Performed at [facility], [place] [address], [place], [place] [address]    Report Status 04/15/2020 FINAL  Final  Surgical pcr screen     Status: None   Collection Time: 04/14/20  5:44 PM   Specimen: Nasal Mucosa; Nasal Swab  Result Value Ref Range Status   MRSA, PCR NEGATIVE NEGATIVE Final   Staphylococcus aureus NEGATIVE NEGATIVE Final    Comment: (NOTE) The Xpert SA Assay (FDA approved for NASAL specimens in patients 26 years of age and older), is one component of a comprehensive surveillance program. It is not intended to diagnose infection nor to guide or monitor treatment. Performed at [facility], [place] [address] [place], [place] [address]      Studies: No results found.    Flora Lipps, MD  [facility] 04/18/2020  If 7PM-7AM, please contact night-coverage

## 2020-04-19 ENCOUNTER — Encounter (HOSPITAL_COMMUNITY): Payer: Self-pay | Admitting: Orthopedic Surgery

## 2020-04-19 DIAGNOSIS — I059 Rheumatic mitral valve disease, unspecified: Secondary | ICD-10-CM | POA: Diagnosis not present

## 2020-04-19 DIAGNOSIS — M2550 Pain in unspecified joint: Secondary | ICD-10-CM | POA: Diagnosis not present

## 2020-04-19 DIAGNOSIS — Z4789 Encounter for other orthopedic aftercare: Secondary | ICD-10-CM | POA: Diagnosis not present

## 2020-04-19 DIAGNOSIS — I272 Pulmonary hypertension, unspecified: Secondary | ICD-10-CM | POA: Diagnosis not present

## 2020-04-19 DIAGNOSIS — I773 Arterial fibromuscular dysplasia: Secondary | ICD-10-CM | POA: Diagnosis not present

## 2020-04-19 DIAGNOSIS — M545 Low back pain, unspecified: Secondary | ICD-10-CM | POA: Diagnosis not present

## 2020-04-19 DIAGNOSIS — R5381 Other malaise: Secondary | ICD-10-CM | POA: Diagnosis not present

## 2020-04-19 DIAGNOSIS — S72144D Nondisplaced intertrochanteric fracture of right femur, subsequent encounter for closed fracture with routine healing: Secondary | ICD-10-CM | POA: Diagnosis not present

## 2020-04-19 DIAGNOSIS — Z859 Personal history of malignant neoplasm, unspecified: Secondary | ICD-10-CM | POA: Diagnosis not present

## 2020-04-19 DIAGNOSIS — S72141D Displaced intertrochanteric fracture of right femur, subsequent encounter for closed fracture with routine healing: Secondary | ICD-10-CM | POA: Diagnosis not present

## 2020-04-19 DIAGNOSIS — R2681 Unsteadiness on feet: Secondary | ICD-10-CM | POA: Diagnosis not present

## 2020-04-19 DIAGNOSIS — M25551 Pain in right hip: Secondary | ICD-10-CM | POA: Diagnosis not present

## 2020-04-19 DIAGNOSIS — E079 Disorder of thyroid, unspecified: Secondary | ICD-10-CM | POA: Diagnosis not present

## 2020-04-19 DIAGNOSIS — L89312 Pressure ulcer of right buttock, stage 2: Secondary | ICD-10-CM | POA: Diagnosis not present

## 2020-04-19 DIAGNOSIS — R1314 Dysphagia, pharyngoesophageal phase: Secondary | ICD-10-CM | POA: Diagnosis not present

## 2020-04-19 DIAGNOSIS — I270 Primary pulmonary hypertension: Secondary | ICD-10-CM | POA: Diagnosis not present

## 2020-04-19 DIAGNOSIS — I10 Essential (primary) hypertension: Secondary | ICD-10-CM | POA: Diagnosis not present

## 2020-04-19 DIAGNOSIS — D696 Thrombocytopenia, unspecified: Secondary | ICD-10-CM | POA: Diagnosis not present

## 2020-04-19 DIAGNOSIS — Z7401 Bed confinement status: Secondary | ICD-10-CM | POA: Diagnosis not present

## 2020-04-19 DIAGNOSIS — W19XXXD Unspecified fall, subsequent encounter: Secondary | ICD-10-CM | POA: Diagnosis not present

## 2020-04-19 DIAGNOSIS — I341 Nonrheumatic mitral (valve) prolapse: Secondary | ICD-10-CM | POA: Diagnosis not present

## 2020-04-19 DIAGNOSIS — L89159 Pressure ulcer of sacral region, unspecified stage: Secondary | ICD-10-CM | POA: Diagnosis not present

## 2020-04-19 DIAGNOSIS — E440 Moderate protein-calorie malnutrition: Secondary | ICD-10-CM | POA: Diagnosis not present

## 2020-04-19 DIAGNOSIS — M6281 Muscle weakness (generalized): Secondary | ICD-10-CM | POA: Diagnosis not present

## 2020-04-19 DIAGNOSIS — R32 Unspecified urinary incontinence: Secondary | ICD-10-CM | POA: Diagnosis not present

## 2020-04-19 DIAGNOSIS — R531 Weakness: Secondary | ICD-10-CM | POA: Diagnosis not present

## 2020-04-19 DIAGNOSIS — Z4889 Encounter for other specified surgical aftercare: Secondary | ICD-10-CM | POA: Diagnosis not present

## 2020-04-19 DIAGNOSIS — W19XXXA Unspecified fall, initial encounter: Secondary | ICD-10-CM | POA: Diagnosis not present

## 2020-04-19 DIAGNOSIS — Z8781 Personal history of (healed) traumatic fracture: Secondary | ICD-10-CM | POA: Diagnosis not present

## 2020-04-19 DIAGNOSIS — R278 Other lack of coordination: Secondary | ICD-10-CM | POA: Diagnosis not present

## 2020-04-19 NOTE — Progress Notes (Signed)
Attempt to call report to SNF facility, no one available to take report at this time.

## 2020-04-19 NOTE — Progress Notes (Signed)
Physical Therapy Treatment Patient Details Name: Ashley Washington MRN: [id number] DOB: [birth year] Today's Date: 04/19/2020    History of Present Illness Pt s/p fall with R intertrochanteric femur fx now s/p IM nailing on 04/15/20.  Also with "possible S2 fx".  Pt with hx of sciatica and prior S2 fx per dtr    PT Comments    Pt making good progress with transfers and gait.  Pt with most pain during supine to/from sit and when sitting.  Once standing and walking pain was tolerable. Required min A to stand and ambulate with mod to mod x 2 for sit to/from supine. Daughter present and was involved and interested in learning anything she can do to help.  Education provided on HEP, transfers, gait, positioning, and pain control. Pt is motivated and making good progress. Continue plan of care while hospitalized    Follow Up Recommendations  SNF     Equipment Recommendations  None recommended by PT (has RW)    Recommendations for Other Services       Precautions / Restrictions Precautions Precautions: Fall Precaution Comments: does not tolerate sitting well due to pain in back and hip Restrictions Weight Bearing Restrictions: No RLE Weight Bearing: Weight bearing as tolerated    Mobility  Bed Mobility Overal bed mobility: Needs Assistance Bed Mobility: Supine to Sit;Sit to Supine     Supine to sit: Mod assist Sit to supine: Mod assist;+2 for physical assistance   General bed mobility comments: Increased time with cues for sequencing.  Supine to sit: pt slid legs to L side, partial roll, and mod A to lift trunk.  Sit to Supine: Mod x 2 "Helicopter" back to bed due to pain  Transfers Overall transfer level: Needs assistance Equipment used: Rolling walker (2 wheeled) Transfers: Sit to/from Stand Sit to Stand: Min assist         General transfer comment: Pt performed sit to stand from bed, chair, and BSC.  Min A to rise and steady with cues for safe hand placement and to back all  the way prior to sitting.  Cues for weight shifting to scoot forward prior to standing  Ambulation/Gait Ambulation/Gait assistance: Min assist Gait Distance (Feet): 80 Feet Assistive device: Rolling walker (2 wheeled) Gait Pattern/deviations: Step-through pattern;Decreased stride length;Antalgic Gait velocity: decr   General Gait Details: Pt started with step to R gait but progressed to step through reciprocal pattern.  She demonstrated good RW proximity.  Min cues for posture.   Stairs             Wheelchair Mobility    Modified Rankin (Stroke Patients Only)       Balance Overall balance assessment: Needs assistance Sitting-balance support: Bilateral upper extremity supported;Feet supported Sitting balance-Leahy Scale: Fair Sitting balance - Comments: Uses UE at EOB but to relief weight on sacrum not for balance   Standing balance support: Bilateral upper extremity supported;During functional activity Standing balance-Leahy Scale: Poor Standing balance comment: required RW but was steady with ambulation and toileting ADLs (dtr assisted with ADLs)                            Cognition Arousal/Alertness: Awake/alert Behavior During Therapy: WFL for tasks assessed/performed Overall Cognitive Status: Within Functional Limits for tasks assessed  Exercises  Educated on and demonstrated below.  Did not perform at this time due to pain.   Access Code: [email or web address]  URL: [email or web address]  Date: 04/19/2020  Prepared by: Maggie Font   Exercises  Supine Ankle Pumps - 3 x daily - 7 x weekly - 1 sets - 10 reps  Supine Heel Slide - 3 x daily - 7 x weekly - 1 sets - 10 reps  Supine Hip Abduction - 3 x daily - 7 x weekly - 1 sets - 10 reps  Supine Quadricep Sets - 3 x daily - 7 x weekly - 1 sets - 10 reps - 5 hold  Seated Long Arc Quad - 3 x daily - 7 x weekly - 1 sets - 10 repsAccess Code:  [email or web address]     General Comments General comments (skin integrity, edema, etc.): Daughter present and very engaged/interested in learning safe techniques.  Spent increased time educating on transfer techniques, HEP (provided home HEP and demonstrated but did not perform due to pain), and pts progress.  Discussed positioning in bed to relief pressure on sacrum (pillow under knees) and positioned in chair with 2 pillows under buttock.  Noted edema/tightness in low back - discussed back precautions and could even consider lumbar corset brace for comfort when OOB. Discussed safe ice use.      Pertinent Vitals/Pain Pain Assessment: Faces Faces Pain Scale: Hurts whole lot (with sitting and transfers;4/10 walking and supine) Pain Location: R hip, bil "piriformis" and low back Pain Descriptors / Indicators: Grimacing;Guarding;Moaning;Sore;Discomfort Pain Intervention(s): Monitored during session;Premedicated before session;Limited activity within patient's tolerance;Repositioned;Relaxation    Home Living                      Prior Function            PT Goals (current goals can now be found in the care plan section) Acute Rehab PT Goals Patient Stated Goal: Regain IND PT Goal Formulation: With patient/family Time For Goal Achievement: 04/25/20 Potential to Achieve Goals: Good Progress towards PT goals: Progressing toward goals    Frequency    Min 3X/week      PT Plan Current plan remains appropriate    Co-evaluation              AM-PAC PT "6 Clicks" Mobility   Outcome Measure  Help needed turning from your back to your side while in a flat bed without using bedrails?: A Lot Help needed moving from lying on your back to sitting on the side of a flat bed without using bedrails?: A Lot Help needed moving to and from a bed to a chair (including a wheelchair)?: A Little Help needed standing up from a chair using your arms (e.g., wheelchair or bedside chair)?: A  Little Help needed to walk in hospital room?: A Little Help needed climbing 3-5 steps with a railing? : A Lot 6 Click Score: 15    End of Session Equipment Utilized During Treatment: Gait belt Activity Tolerance: Patient limited by pain Patient left: in bed;with call bell/phone within reach;with bed alarm set;with family/visitor present Nurse Communication: Mobility status PT Visit Diagnosis: Difficulty in walking, not elsewhere classified (R26.2);History of falling (Z91.81);Pain Pain - Right/Left: Right Pain - part of body: Hip     Time: 0177-9390 PT Time Calculation (min) (ACUTE ONLY): 60 min  Charges:  $Gait Training: 8-22 mins $Therapeutic Exercise: 8-22 mins $Therapeutic Activity: 23-37 mins  Abran Richard, PT Acute Rehab Services Pager [phone] [facility] [place] 04/19/2020, 12:06 PM

## 2020-04-19 NOTE — Progress Notes (Signed)
Patient seen and examined.  Complains of hip pain Vitals with BMI 04/19/2020 04/18/2020 04/18/2020  Height - - -  Weight - - -  BMI - - -  Systolic 374 451 460  Diastolic 73 58 57  Pulse 66 62 64  local hip site ok. No interval changes in the plan for discharge. [place] for discharge today, see discharge instructions dated 2/8

## 2020-04-19 NOTE — TOC Transition Note (Signed)
Transition of Care [facility]) - CM/SW Discharge Note   Patient Details  Name: Ashley Washington MRN: [id number] Date of Birth: June 13, 1932  Transition of Care [facility]) CM/SW Contact:  Lennart Pall, LCSW Phone Number: 04/19/2020, 12:28 PM   Clinical Narrative:    Pt medically ready for dc and COVID test has resulted.  Pt has accepted bed at [facility] and will transport to facility today via [place].  RN to call report to [phone].  No further TOC needs.   Final next level of care: Skilled Nursing Facility Barriers to Discharge: Barriers Resolved   Patient Goals and CMS Choice Patient states their goals for this hospitalization and ongoing recovery are:: to eventually return to home      Discharge Placement PASRR number recieved: 04/17/20            Patient chooses bed at: [facility], [place] Patient to be transferred to facility by: [facility] Name of family member notified: daughter, Hinton Dyer Patient and family notified of of transfer: 04/19/20  Discharge Plan and Services In-house Referral: Clinical Social Work              DME Arranged: N/A DME Agency: NA                  Social Determinants of Health ([place]) Interventions     Readmission Risk Interventions Readmission Risk Prevention Plan 04/14/2020  Post Dischage Appt Complete  Medication Screening Complete  Transportation Screening Complete  Some recent data might be hidden

## 2020-04-23 DIAGNOSIS — I10 Essential (primary) hypertension: Secondary | ICD-10-CM | POA: Diagnosis not present

## 2020-04-23 DIAGNOSIS — L89159 Pressure ulcer of sacral region, unspecified stage: Secondary | ICD-10-CM | POA: Diagnosis not present

## 2020-04-23 DIAGNOSIS — Z8781 Personal history of (healed) traumatic fracture: Secondary | ICD-10-CM | POA: Diagnosis not present

## 2020-04-23 DIAGNOSIS — I270 Primary pulmonary hypertension: Secondary | ICD-10-CM | POA: Diagnosis not present

## 2020-04-23 DIAGNOSIS — M545 Low back pain, unspecified: Secondary | ICD-10-CM | POA: Diagnosis not present

## 2020-04-26 DIAGNOSIS — L89312 Pressure ulcer of right buttock, stage 2: Secondary | ICD-10-CM | POA: Diagnosis not present

## 2020-04-28 DIAGNOSIS — S72141A Displaced intertrochanteric fracture of right femur, initial encounter for closed fracture: Secondary | ICD-10-CM | POA: Insufficient documentation

## 2020-05-01 DIAGNOSIS — S72141D Displaced intertrochanteric fracture of right femur, subsequent encounter for closed fracture with routine healing: Secondary | ICD-10-CM | POA: Diagnosis not present

## 2020-05-02 DIAGNOSIS — R600 Localized edema: Secondary | ICD-10-CM | POA: Diagnosis not present

## 2020-05-02 DIAGNOSIS — I10 Essential (primary) hypertension: Secondary | ICD-10-CM | POA: Diagnosis not present

## 2020-05-02 DIAGNOSIS — K5903 Drug induced constipation: Secondary | ICD-10-CM | POA: Diagnosis not present

## 2020-05-02 DIAGNOSIS — L89311 Pressure ulcer of right buttock, stage 1: Secondary | ICD-10-CM | POA: Diagnosis not present

## 2020-05-02 DIAGNOSIS — I272 Pulmonary hypertension, unspecified: Secondary | ICD-10-CM | POA: Diagnosis not present

## 2020-05-02 DIAGNOSIS — S72144A Nondisplaced intertrochanteric fracture of right femur, initial encounter for closed fracture: Secondary | ICD-10-CM | POA: Diagnosis not present

## 2020-05-02 DIAGNOSIS — M48061 Spinal stenosis, lumbar region without neurogenic claudication: Secondary | ICD-10-CM | POA: Diagnosis not present

## 2020-05-03 DIAGNOSIS — I440 Atrioventricular block, first degree: Secondary | ICD-10-CM | POA: Diagnosis not present

## 2020-05-03 DIAGNOSIS — K5790 Diverticulosis of intestine, part unspecified, without perforation or abscess without bleeding: Secondary | ICD-10-CM | POA: Diagnosis not present

## 2020-05-03 DIAGNOSIS — M810 Age-related osteoporosis without current pathological fracture: Secondary | ICD-10-CM | POA: Diagnosis not present

## 2020-05-03 DIAGNOSIS — F419 Anxiety disorder, unspecified: Secondary | ICD-10-CM | POA: Diagnosis not present

## 2020-05-03 DIAGNOSIS — Z7982 Long term (current) use of aspirin: Secondary | ICD-10-CM | POA: Diagnosis not present

## 2020-05-03 DIAGNOSIS — S72144D Nondisplaced intertrochanteric fracture of right femur, subsequent encounter for closed fracture with routine healing: Secondary | ICD-10-CM | POA: Diagnosis not present

## 2020-05-03 DIAGNOSIS — M1990 Unspecified osteoarthritis, unspecified site: Secondary | ICD-10-CM | POA: Diagnosis not present

## 2020-05-03 DIAGNOSIS — I2510 Atherosclerotic heart disease of native coronary artery without angina pectoris: Secondary | ICD-10-CM | POA: Diagnosis not present

## 2020-05-03 DIAGNOSIS — S3210XD Unspecified fracture of sacrum, subsequent encounter for fracture with routine healing: Secondary | ICD-10-CM | POA: Diagnosis not present

## 2020-05-03 DIAGNOSIS — H9190 Unspecified hearing loss, unspecified ear: Secondary | ICD-10-CM | POA: Diagnosis not present

## 2020-05-03 DIAGNOSIS — E559 Vitamin D deficiency, unspecified: Secondary | ICD-10-CM | POA: Diagnosis not present

## 2020-05-03 DIAGNOSIS — M5116 Intervertebral disc disorders with radiculopathy, lumbar region: Secondary | ICD-10-CM | POA: Diagnosis not present

## 2020-05-03 DIAGNOSIS — I341 Nonrheumatic mitral (valve) prolapse: Secondary | ICD-10-CM | POA: Diagnosis not present

## 2020-05-03 DIAGNOSIS — M797 Fibromyalgia: Secondary | ICD-10-CM | POA: Diagnosis not present

## 2020-05-03 DIAGNOSIS — Z9181 History of falling: Secondary | ICD-10-CM | POA: Diagnosis not present

## 2020-05-03 DIAGNOSIS — I700 Atherosclerosis of aorta: Secondary | ICD-10-CM | POA: Diagnosis not present

## 2020-05-03 DIAGNOSIS — I272 Pulmonary hypertension, unspecified: Secondary | ICD-10-CM | POA: Diagnosis not present

## 2020-05-03 DIAGNOSIS — J439 Emphysema, unspecified: Secondary | ICD-10-CM | POA: Diagnosis not present

## 2020-05-03 DIAGNOSIS — I773 Arterial fibromuscular dysplasia: Secondary | ICD-10-CM | POA: Diagnosis not present

## 2020-05-03 DIAGNOSIS — M48061 Spinal stenosis, lumbar region without neurogenic claudication: Secondary | ICD-10-CM | POA: Diagnosis not present

## 2020-05-03 DIAGNOSIS — N393 Stress incontinence (female) (male): Secondary | ICD-10-CM | POA: Diagnosis not present

## 2020-05-03 DIAGNOSIS — Z86010 Personal history of colonic polyps: Secondary | ICD-10-CM | POA: Diagnosis not present

## 2020-05-03 DIAGNOSIS — E440 Moderate protein-calorie malnutrition: Secondary | ICD-10-CM | POA: Diagnosis not present

## 2020-05-06 DIAGNOSIS — I272 Pulmonary hypertension, unspecified: Secondary | ICD-10-CM | POA: Diagnosis not present

## 2020-05-06 DIAGNOSIS — I2510 Atherosclerotic heart disease of native coronary artery without angina pectoris: Secondary | ICD-10-CM | POA: Diagnosis not present

## 2020-05-06 DIAGNOSIS — J439 Emphysema, unspecified: Secondary | ICD-10-CM | POA: Diagnosis not present

## 2020-05-06 DIAGNOSIS — M48061 Spinal stenosis, lumbar region without neurogenic claudication: Secondary | ICD-10-CM | POA: Diagnosis not present

## 2020-05-06 DIAGNOSIS — S3210XD Unspecified fracture of sacrum, subsequent encounter for fracture with routine healing: Secondary | ICD-10-CM | POA: Diagnosis not present

## 2020-05-06 DIAGNOSIS — S72144D Nondisplaced intertrochanteric fracture of right femur, subsequent encounter for closed fracture with routine healing: Secondary | ICD-10-CM | POA: Diagnosis not present

## 2020-05-09 DIAGNOSIS — S3210XD Unspecified fracture of sacrum, subsequent encounter for fracture with routine healing: Secondary | ICD-10-CM | POA: Diagnosis not present

## 2020-05-09 DIAGNOSIS — J439 Emphysema, unspecified: Secondary | ICD-10-CM | POA: Diagnosis not present

## 2020-05-09 DIAGNOSIS — I272 Pulmonary hypertension, unspecified: Secondary | ICD-10-CM | POA: Diagnosis not present

## 2020-05-09 DIAGNOSIS — M48061 Spinal stenosis, lumbar region without neurogenic claudication: Secondary | ICD-10-CM | POA: Diagnosis not present

## 2020-05-09 DIAGNOSIS — S72144D Nondisplaced intertrochanteric fracture of right femur, subsequent encounter for closed fracture with routine healing: Secondary | ICD-10-CM | POA: Diagnosis not present

## 2020-05-09 DIAGNOSIS — I2510 Atherosclerotic heart disease of native coronary artery without angina pectoris: Secondary | ICD-10-CM | POA: Diagnosis not present

## 2020-05-11 DIAGNOSIS — S72144D Nondisplaced intertrochanteric fracture of right femur, subsequent encounter for closed fracture with routine healing: Secondary | ICD-10-CM | POA: Diagnosis not present

## 2020-05-11 DIAGNOSIS — I2510 Atherosclerotic heart disease of native coronary artery without angina pectoris: Secondary | ICD-10-CM | POA: Diagnosis not present

## 2020-05-11 DIAGNOSIS — I272 Pulmonary hypertension, unspecified: Secondary | ICD-10-CM | POA: Diagnosis not present

## 2020-05-11 DIAGNOSIS — J439 Emphysema, unspecified: Secondary | ICD-10-CM | POA: Diagnosis not present

## 2020-05-11 DIAGNOSIS — S3210XD Unspecified fracture of sacrum, subsequent encounter for fracture with routine healing: Secondary | ICD-10-CM | POA: Diagnosis not present

## 2020-05-11 DIAGNOSIS — M48061 Spinal stenosis, lumbar region without neurogenic claudication: Secondary | ICD-10-CM | POA: Diagnosis not present

## 2020-05-12 DIAGNOSIS — I2510 Atherosclerotic heart disease of native coronary artery without angina pectoris: Secondary | ICD-10-CM | POA: Diagnosis not present

## 2020-05-12 DIAGNOSIS — S72144D Nondisplaced intertrochanteric fracture of right femur, subsequent encounter for closed fracture with routine healing: Secondary | ICD-10-CM | POA: Diagnosis not present

## 2020-05-12 DIAGNOSIS — S3210XD Unspecified fracture of sacrum, subsequent encounter for fracture with routine healing: Secondary | ICD-10-CM | POA: Diagnosis not present

## 2020-05-12 DIAGNOSIS — M48061 Spinal stenosis, lumbar region without neurogenic claudication: Secondary | ICD-10-CM | POA: Diagnosis not present

## 2020-05-12 DIAGNOSIS — J439 Emphysema, unspecified: Secondary | ICD-10-CM | POA: Diagnosis not present

## 2020-05-12 DIAGNOSIS — I272 Pulmonary hypertension, unspecified: Secondary | ICD-10-CM | POA: Diagnosis not present

## 2020-05-16 DIAGNOSIS — I2510 Atherosclerotic heart disease of native coronary artery without angina pectoris: Secondary | ICD-10-CM | POA: Diagnosis not present

## 2020-05-16 DIAGNOSIS — S72144D Nondisplaced intertrochanteric fracture of right femur, subsequent encounter for closed fracture with routine healing: Secondary | ICD-10-CM | POA: Diagnosis not present

## 2020-05-16 DIAGNOSIS — S3210XD Unspecified fracture of sacrum, subsequent encounter for fracture with routine healing: Secondary | ICD-10-CM | POA: Diagnosis not present

## 2020-05-16 DIAGNOSIS — M48061 Spinal stenosis, lumbar region without neurogenic claudication: Secondary | ICD-10-CM | POA: Diagnosis not present

## 2020-05-16 DIAGNOSIS — I272 Pulmonary hypertension, unspecified: Secondary | ICD-10-CM | POA: Diagnosis not present

## 2020-05-16 DIAGNOSIS — J439 Emphysema, unspecified: Secondary | ICD-10-CM | POA: Diagnosis not present

## 2020-05-17 DIAGNOSIS — L718 Other rosacea: Secondary | ICD-10-CM | POA: Diagnosis not present

## 2020-05-17 DIAGNOSIS — I2510 Atherosclerotic heart disease of native coronary artery without angina pectoris: Secondary | ICD-10-CM | POA: Diagnosis not present

## 2020-05-17 DIAGNOSIS — H0288A Meibomian gland dysfunction right eye, upper and lower eyelids: Secondary | ICD-10-CM | POA: Diagnosis not present

## 2020-05-17 DIAGNOSIS — S72144D Nondisplaced intertrochanteric fracture of right femur, subsequent encounter for closed fracture with routine healing: Secondary | ICD-10-CM | POA: Diagnosis not present

## 2020-05-17 DIAGNOSIS — J439 Emphysema, unspecified: Secondary | ICD-10-CM | POA: Diagnosis not present

## 2020-05-17 DIAGNOSIS — I272 Pulmonary hypertension, unspecified: Secondary | ICD-10-CM | POA: Diagnosis not present

## 2020-05-17 DIAGNOSIS — H0288B Meibomian gland dysfunction left eye, upper and lower eyelids: Secondary | ICD-10-CM | POA: Diagnosis not present

## 2020-05-17 DIAGNOSIS — M48061 Spinal stenosis, lumbar region without neurogenic claudication: Secondary | ICD-10-CM | POA: Diagnosis not present

## 2020-05-17 DIAGNOSIS — Z961 Presence of intraocular lens: Secondary | ICD-10-CM | POA: Diagnosis not present

## 2020-05-17 DIAGNOSIS — S3210XD Unspecified fracture of sacrum, subsequent encounter for fracture with routine healing: Secondary | ICD-10-CM | POA: Diagnosis not present

## 2020-05-17 DIAGNOSIS — H3530 Unspecified macular degeneration: Secondary | ICD-10-CM | POA: Diagnosis not present

## 2020-05-18 DIAGNOSIS — M48061 Spinal stenosis, lumbar region without neurogenic claudication: Secondary | ICD-10-CM | POA: Diagnosis not present

## 2020-05-18 DIAGNOSIS — I272 Pulmonary hypertension, unspecified: Secondary | ICD-10-CM | POA: Diagnosis not present

## 2020-05-18 DIAGNOSIS — S72144D Nondisplaced intertrochanteric fracture of right femur, subsequent encounter for closed fracture with routine healing: Secondary | ICD-10-CM | POA: Diagnosis not present

## 2020-05-18 DIAGNOSIS — S3210XD Unspecified fracture of sacrum, subsequent encounter for fracture with routine healing: Secondary | ICD-10-CM | POA: Diagnosis not present

## 2020-05-18 DIAGNOSIS — J439 Emphysema, unspecified: Secondary | ICD-10-CM | POA: Diagnosis not present

## 2020-05-18 DIAGNOSIS — I2510 Atherosclerotic heart disease of native coronary artery without angina pectoris: Secondary | ICD-10-CM | POA: Diagnosis not present

## 2020-05-19 DIAGNOSIS — S72144D Nondisplaced intertrochanteric fracture of right femur, subsequent encounter for closed fracture with routine healing: Secondary | ICD-10-CM | POA: Diagnosis not present

## 2020-05-19 DIAGNOSIS — M48061 Spinal stenosis, lumbar region without neurogenic claudication: Secondary | ICD-10-CM | POA: Diagnosis not present

## 2020-05-19 DIAGNOSIS — S3210XD Unspecified fracture of sacrum, subsequent encounter for fracture with routine healing: Secondary | ICD-10-CM | POA: Diagnosis not present

## 2020-05-19 DIAGNOSIS — I2510 Atherosclerotic heart disease of native coronary artery without angina pectoris: Secondary | ICD-10-CM | POA: Diagnosis not present

## 2020-05-19 DIAGNOSIS — I272 Pulmonary hypertension, unspecified: Secondary | ICD-10-CM | POA: Diagnosis not present

## 2020-05-19 DIAGNOSIS — J439 Emphysema, unspecified: Secondary | ICD-10-CM | POA: Diagnosis not present

## 2020-05-24 DIAGNOSIS — I272 Pulmonary hypertension, unspecified: Secondary | ICD-10-CM | POA: Diagnosis not present

## 2020-05-24 DIAGNOSIS — J439 Emphysema, unspecified: Secondary | ICD-10-CM | POA: Diagnosis not present

## 2020-05-24 DIAGNOSIS — S72144D Nondisplaced intertrochanteric fracture of right femur, subsequent encounter for closed fracture with routine healing: Secondary | ICD-10-CM | POA: Diagnosis not present

## 2020-05-24 DIAGNOSIS — I2510 Atherosclerotic heart disease of native coronary artery without angina pectoris: Secondary | ICD-10-CM | POA: Diagnosis not present

## 2020-05-24 DIAGNOSIS — S3210XD Unspecified fracture of sacrum, subsequent encounter for fracture with routine healing: Secondary | ICD-10-CM | POA: Diagnosis not present

## 2020-05-24 DIAGNOSIS — M48061 Spinal stenosis, lumbar region without neurogenic claudication: Secondary | ICD-10-CM | POA: Diagnosis not present

## 2020-05-26 DIAGNOSIS — S72144D Nondisplaced intertrochanteric fracture of right femur, subsequent encounter for closed fracture with routine healing: Secondary | ICD-10-CM | POA: Diagnosis not present

## 2020-05-26 DIAGNOSIS — S3210XD Unspecified fracture of sacrum, subsequent encounter for fracture with routine healing: Secondary | ICD-10-CM | POA: Diagnosis not present

## 2020-05-26 DIAGNOSIS — M48061 Spinal stenosis, lumbar region without neurogenic claudication: Secondary | ICD-10-CM | POA: Diagnosis not present

## 2020-05-26 DIAGNOSIS — J439 Emphysema, unspecified: Secondary | ICD-10-CM | POA: Diagnosis not present

## 2020-05-26 DIAGNOSIS — I272 Pulmonary hypertension, unspecified: Secondary | ICD-10-CM | POA: Diagnosis not present

## 2020-05-26 DIAGNOSIS — I2510 Atherosclerotic heart disease of native coronary artery without angina pectoris: Secondary | ICD-10-CM | POA: Diagnosis not present

## 2020-05-29 DIAGNOSIS — S3210XD Unspecified fracture of sacrum, subsequent encounter for fracture with routine healing: Secondary | ICD-10-CM | POA: Diagnosis not present

## 2020-05-29 DIAGNOSIS — J439 Emphysema, unspecified: Secondary | ICD-10-CM | POA: Diagnosis not present

## 2020-05-29 DIAGNOSIS — I2510 Atherosclerotic heart disease of native coronary artery without angina pectoris: Secondary | ICD-10-CM | POA: Diagnosis not present

## 2020-05-29 DIAGNOSIS — I272 Pulmonary hypertension, unspecified: Secondary | ICD-10-CM | POA: Diagnosis not present

## 2020-05-29 DIAGNOSIS — M48061 Spinal stenosis, lumbar region without neurogenic claudication: Secondary | ICD-10-CM | POA: Diagnosis not present

## 2020-05-29 DIAGNOSIS — S72144D Nondisplaced intertrochanteric fracture of right femur, subsequent encounter for closed fracture with routine healing: Secondary | ICD-10-CM | POA: Diagnosis not present

## 2020-05-30 DIAGNOSIS — S72141D Displaced intertrochanteric fracture of right femur, subsequent encounter for closed fracture with routine healing: Secondary | ICD-10-CM | POA: Diagnosis not present

## 2020-06-02 DIAGNOSIS — I2510 Atherosclerotic heart disease of native coronary artery without angina pectoris: Secondary | ICD-10-CM | POA: Diagnosis not present

## 2020-06-02 DIAGNOSIS — I440 Atrioventricular block, first degree: Secondary | ICD-10-CM | POA: Diagnosis not present

## 2020-06-02 DIAGNOSIS — M5116 Intervertebral disc disorders with radiculopathy, lumbar region: Secondary | ICD-10-CM | POA: Diagnosis not present

## 2020-06-02 DIAGNOSIS — S72144D Nondisplaced intertrochanteric fracture of right femur, subsequent encounter for closed fracture with routine healing: Secondary | ICD-10-CM | POA: Diagnosis not present

## 2020-06-02 DIAGNOSIS — M810 Age-related osteoporosis without current pathological fracture: Secondary | ICD-10-CM | POA: Diagnosis not present

## 2020-06-02 DIAGNOSIS — Z7982 Long term (current) use of aspirin: Secondary | ICD-10-CM | POA: Diagnosis not present

## 2020-06-02 DIAGNOSIS — Z9181 History of falling: Secondary | ICD-10-CM | POA: Diagnosis not present

## 2020-06-02 DIAGNOSIS — H9190 Unspecified hearing loss, unspecified ear: Secondary | ICD-10-CM | POA: Diagnosis not present

## 2020-06-02 DIAGNOSIS — M48061 Spinal stenosis, lumbar region without neurogenic claudication: Secondary | ICD-10-CM | POA: Diagnosis not present

## 2020-06-02 DIAGNOSIS — N393 Stress incontinence (female) (male): Secondary | ICD-10-CM | POA: Diagnosis not present

## 2020-06-02 DIAGNOSIS — J439 Emphysema, unspecified: Secondary | ICD-10-CM | POA: Diagnosis not present

## 2020-06-02 DIAGNOSIS — F419 Anxiety disorder, unspecified: Secondary | ICD-10-CM | POA: Diagnosis not present

## 2020-06-02 DIAGNOSIS — M797 Fibromyalgia: Secondary | ICD-10-CM | POA: Diagnosis not present

## 2020-06-02 DIAGNOSIS — I773 Arterial fibromuscular dysplasia: Secondary | ICD-10-CM | POA: Diagnosis not present

## 2020-06-02 DIAGNOSIS — K5790 Diverticulosis of intestine, part unspecified, without perforation or abscess without bleeding: Secondary | ICD-10-CM | POA: Diagnosis not present

## 2020-06-02 DIAGNOSIS — M1990 Unspecified osteoarthritis, unspecified site: Secondary | ICD-10-CM | POA: Diagnosis not present

## 2020-06-02 DIAGNOSIS — I272 Pulmonary hypertension, unspecified: Secondary | ICD-10-CM | POA: Diagnosis not present

## 2020-06-02 DIAGNOSIS — E559 Vitamin D deficiency, unspecified: Secondary | ICD-10-CM | POA: Diagnosis not present

## 2020-06-02 DIAGNOSIS — I341 Nonrheumatic mitral (valve) prolapse: Secondary | ICD-10-CM | POA: Diagnosis not present

## 2020-06-02 DIAGNOSIS — I700 Atherosclerosis of aorta: Secondary | ICD-10-CM | POA: Diagnosis not present

## 2020-06-02 DIAGNOSIS — E440 Moderate protein-calorie malnutrition: Secondary | ICD-10-CM | POA: Diagnosis not present

## 2020-06-02 DIAGNOSIS — S3210XD Unspecified fracture of sacrum, subsequent encounter for fracture with routine healing: Secondary | ICD-10-CM | POA: Diagnosis not present

## 2020-06-02 DIAGNOSIS — Z86010 Personal history of colonic polyps: Secondary | ICD-10-CM | POA: Diagnosis not present

## 2020-06-14 DIAGNOSIS — I2510 Atherosclerotic heart disease of native coronary artery without angina pectoris: Secondary | ICD-10-CM | POA: Diagnosis not present

## 2020-06-14 DIAGNOSIS — S72144D Nondisplaced intertrochanteric fracture of right femur, subsequent encounter for closed fracture with routine healing: Secondary | ICD-10-CM | POA: Diagnosis not present

## 2020-06-14 DIAGNOSIS — S3210XD Unspecified fracture of sacrum, subsequent encounter for fracture with routine healing: Secondary | ICD-10-CM | POA: Diagnosis not present

## 2020-06-14 DIAGNOSIS — I272 Pulmonary hypertension, unspecified: Secondary | ICD-10-CM | POA: Diagnosis not present

## 2020-06-14 DIAGNOSIS — M48061 Spinal stenosis, lumbar region without neurogenic claudication: Secondary | ICD-10-CM | POA: Diagnosis not present

## 2020-06-14 DIAGNOSIS — J439 Emphysema, unspecified: Secondary | ICD-10-CM | POA: Diagnosis not present

## 2020-06-21 DIAGNOSIS — S3210XD Unspecified fracture of sacrum, subsequent encounter for fracture with routine healing: Secondary | ICD-10-CM | POA: Diagnosis not present

## 2020-06-21 DIAGNOSIS — J439 Emphysema, unspecified: Secondary | ICD-10-CM | POA: Diagnosis not present

## 2020-06-21 DIAGNOSIS — I2510 Atherosclerotic heart disease of native coronary artery without angina pectoris: Secondary | ICD-10-CM | POA: Diagnosis not present

## 2020-06-21 DIAGNOSIS — M48061 Spinal stenosis, lumbar region without neurogenic claudication: Secondary | ICD-10-CM | POA: Diagnosis not present

## 2020-06-21 DIAGNOSIS — S72144D Nondisplaced intertrochanteric fracture of right femur, subsequent encounter for closed fracture with routine healing: Secondary | ICD-10-CM | POA: Diagnosis not present

## 2020-06-21 DIAGNOSIS — I272 Pulmonary hypertension, unspecified: Secondary | ICD-10-CM | POA: Diagnosis not present

## 2020-06-27 DIAGNOSIS — H903 Sensorineural hearing loss, bilateral: Secondary | ICD-10-CM | POA: Diagnosis not present

## 2020-06-27 DIAGNOSIS — H9313 Tinnitus, bilateral: Secondary | ICD-10-CM | POA: Diagnosis not present

## 2020-06-27 DIAGNOSIS — Z822 Family history of deafness and hearing loss: Secondary | ICD-10-CM | POA: Diagnosis not present

## 2020-06-28 DIAGNOSIS — M810 Age-related osteoporosis without current pathological fracture: Secondary | ICD-10-CM | POA: Diagnosis not present

## 2020-06-28 DIAGNOSIS — M1990 Unspecified osteoarthritis, unspecified site: Secondary | ICD-10-CM | POA: Diagnosis not present

## 2020-06-28 DIAGNOSIS — I440 Atrioventricular block, first degree: Secondary | ICD-10-CM | POA: Diagnosis not present

## 2020-06-28 DIAGNOSIS — S72144A Nondisplaced intertrochanteric fracture of right femur, initial encounter for closed fracture: Secondary | ICD-10-CM | POA: Diagnosis not present

## 2020-06-28 DIAGNOSIS — I10 Essential (primary) hypertension: Secondary | ICD-10-CM | POA: Diagnosis not present

## 2020-06-28 DIAGNOSIS — I700 Atherosclerosis of aorta: Secondary | ICD-10-CM | POA: Diagnosis not present

## 2020-06-28 DIAGNOSIS — M48061 Spinal stenosis, lumbar region without neurogenic claudication: Secondary | ICD-10-CM | POA: Diagnosis not present

## 2020-06-28 DIAGNOSIS — J439 Emphysema, unspecified: Secondary | ICD-10-CM | POA: Diagnosis not present

## 2020-06-28 DIAGNOSIS — F419 Anxiety disorder, unspecified: Secondary | ICD-10-CM | POA: Diagnosis not present

## 2020-07-31 DIAGNOSIS — U071 COVID-19: Secondary | ICD-10-CM | POA: Diagnosis not present

## 2020-08-08 DIAGNOSIS — D224 Melanocytic nevi of scalp and neck: Secondary | ICD-10-CM | POA: Diagnosis not present

## 2020-08-08 DIAGNOSIS — Z85828 Personal history of other malignant neoplasm of skin: Secondary | ICD-10-CM | POA: Diagnosis not present

## 2020-08-08 DIAGNOSIS — L821 Other seborrheic keratosis: Secondary | ICD-10-CM | POA: Diagnosis not present

## 2020-08-08 DIAGNOSIS — L570 Actinic keratosis: Secondary | ICD-10-CM | POA: Diagnosis not present

## 2020-08-08 DIAGNOSIS — Z85820 Personal history of malignant melanoma of skin: Secondary | ICD-10-CM | POA: Diagnosis not present

## 2020-08-08 DIAGNOSIS — L814 Other melanin hyperpigmentation: Secondary | ICD-10-CM | POA: Diagnosis not present

## 2020-08-08 DIAGNOSIS — L918 Other hypertrophic disorders of the skin: Secondary | ICD-10-CM | POA: Diagnosis not present

## 2020-08-08 DIAGNOSIS — D1801 Hemangioma of skin and subcutaneous tissue: Secondary | ICD-10-CM | POA: Diagnosis not present

## 2020-08-08 DIAGNOSIS — L817 Pigmented purpuric dermatosis: Secondary | ICD-10-CM | POA: Diagnosis not present

## 2020-09-12 DIAGNOSIS — M810 Age-related osteoporosis without current pathological fracture: Secondary | ICD-10-CM | POA: Diagnosis not present

## 2020-09-12 DIAGNOSIS — J439 Emphysema, unspecified: Secondary | ICD-10-CM | POA: Diagnosis not present

## 2020-09-12 DIAGNOSIS — I2510 Atherosclerotic heart disease of native coronary artery without angina pectoris: Secondary | ICD-10-CM | POA: Diagnosis not present

## 2020-09-12 DIAGNOSIS — D72818 Other decreased white blood cell count: Secondary | ICD-10-CM | POA: Diagnosis not present

## 2020-09-12 DIAGNOSIS — D696 Thrombocytopenia, unspecified: Secondary | ICD-10-CM | POA: Diagnosis not present

## 2020-09-12 DIAGNOSIS — F419 Anxiety disorder, unspecified: Secondary | ICD-10-CM | POA: Diagnosis not present

## 2020-09-12 DIAGNOSIS — G5700 Lesion of sciatic nerve, unspecified lower limb: Secondary | ICD-10-CM | POA: Diagnosis not present

## 2020-09-12 DIAGNOSIS — I341 Nonrheumatic mitral (valve) prolapse: Secondary | ICD-10-CM | POA: Diagnosis not present

## 2021-01-11 DIAGNOSIS — I10 Essential (primary) hypertension: Secondary | ICD-10-CM | POA: Diagnosis not present

## 2021-01-11 DIAGNOSIS — E559 Vitamin D deficiency, unspecified: Secondary | ICD-10-CM | POA: Diagnosis not present

## 2021-01-18 DIAGNOSIS — I517 Cardiomegaly: Secondary | ICD-10-CM | POA: Diagnosis not present

## 2021-01-18 DIAGNOSIS — D696 Thrombocytopenia, unspecified: Secondary | ICD-10-CM | POA: Diagnosis not present

## 2021-01-18 DIAGNOSIS — I700 Atherosclerosis of aorta: Secondary | ICD-10-CM | POA: Diagnosis not present

## 2021-01-18 DIAGNOSIS — M797 Fibromyalgia: Secondary | ICD-10-CM | POA: Diagnosis not present

## 2021-01-18 DIAGNOSIS — I272 Pulmonary hypertension, unspecified: Secondary | ICD-10-CM | POA: Diagnosis not present

## 2021-01-18 DIAGNOSIS — I341 Nonrheumatic mitral (valve) prolapse: Secondary | ICD-10-CM | POA: Diagnosis not present

## 2021-01-18 DIAGNOSIS — Z0000 Encounter for general adult medical examination without abnormal findings: Secondary | ICD-10-CM | POA: Diagnosis not present

## 2021-01-18 DIAGNOSIS — M48061 Spinal stenosis, lumbar region without neurogenic claudication: Secondary | ICD-10-CM | POA: Diagnosis not present

## 2021-01-18 DIAGNOSIS — R143 Flatulence: Secondary | ICD-10-CM | POA: Diagnosis not present

## 2021-01-18 DIAGNOSIS — Z1331 Encounter for screening for depression: Secondary | ICD-10-CM | POA: Diagnosis not present

## 2021-01-18 DIAGNOSIS — N281 Cyst of kidney, acquired: Secondary | ICD-10-CM | POA: Diagnosis not present

## 2021-01-18 DIAGNOSIS — R82998 Other abnormal findings in urine: Secondary | ICD-10-CM | POA: Diagnosis not present

## 2021-01-18 DIAGNOSIS — M810 Age-related osteoporosis without current pathological fracture: Secondary | ICD-10-CM | POA: Diagnosis not present

## 2021-01-24 DIAGNOSIS — Z961 Presence of intraocular lens: Secondary | ICD-10-CM | POA: Diagnosis not present

## 2021-01-24 DIAGNOSIS — L718 Other rosacea: Secondary | ICD-10-CM | POA: Diagnosis not present

## 2021-01-24 DIAGNOSIS — H0288B Meibomian gland dysfunction left eye, upper and lower eyelids: Secondary | ICD-10-CM | POA: Diagnosis not present

## 2021-01-24 DIAGNOSIS — H0288A Meibomian gland dysfunction right eye, upper and lower eyelids: Secondary | ICD-10-CM | POA: Diagnosis not present

## 2021-01-24 DIAGNOSIS — H3530 Unspecified macular degeneration: Secondary | ICD-10-CM | POA: Diagnosis not present

## 2021-01-25 DIAGNOSIS — R197 Diarrhea, unspecified: Secondary | ICD-10-CM | POA: Diagnosis not present

## 2021-03-29 ENCOUNTER — Ambulatory Visit (INDEPENDENT_AMBULATORY_CARE_PROVIDER_SITE_OTHER): Payer: Medicare Other | Admitting: Podiatry

## 2021-03-29 ENCOUNTER — Other Ambulatory Visit: Payer: Self-pay

## 2021-03-29 ENCOUNTER — Ambulatory Visit (INDEPENDENT_AMBULATORY_CARE_PROVIDER_SITE_OTHER): Payer: Medicare Other

## 2021-03-29 DIAGNOSIS — M2041 Other hammer toe(s) (acquired), right foot: Secondary | ICD-10-CM | POA: Diagnosis not present

## 2021-03-29 DIAGNOSIS — M79671 Pain in right foot: Secondary | ICD-10-CM

## 2021-03-29 DIAGNOSIS — L600 Ingrowing nail: Secondary | ICD-10-CM | POA: Diagnosis not present

## 2021-03-29 DIAGNOSIS — L84 Corns and callosities: Secondary | ICD-10-CM

## 2021-03-29 MED ORDER — MUPIROCIN 2 % EX OINT: 1.0000 "application " | TOPICAL_OINTMENT | Freq: Every day | CUTANEOUS | 2 refills | Status: AC

## 2021-03-29 NOTE — Progress Notes (Signed)
dG

## 2021-04-01 NOTE — Progress Notes (Signed)
Subjective:   Patient ID: Ashley Washington, female   DOB: 88 y.o.   MRN: [id number]   HPI 88 year old female presents the office with concerns of a painful callus on the right fourth toe which started about 1 year ago.  She also gets corns on the fifth toes.  She states that it feels like she is walking on glass.  She is concerned that the big toe is also becoming ingrown possibly.  Denies any open sores or any swelling or redness.  No other concerns.   Review of Systems  All other systems reviewed and are negative. Past Medical History:  Diagnosis Date   Cancer ([place])    Fibromuscular dysplasia of renal artery (HCC)    Hypertension    Mitral valve prolapse    Thyroid disease     Past Surgical History:  Procedure Laterality Date   ABDOMINAL HYSTERECTOMY     APPENDECTOMY     INTRAMEDULLARY (IM) NAIL INTERTROCHANTERIC Right 04/15/2020   Procedure: INTRAMEDULLARY (IM) NAIL INTERTROCHANTRIC;  Surgeon: Rod Can, MD;  Location: [facility] ORS;  Service: Orthopedics;  Laterality: Right;   PARATHYROIDECTOMY     TONSILLECTOMY       Current Outpatient Medications:    mupirocin ointment (BACTROBAN) 2 %, Apply 1 application topically daily., Disp: 30 g, Rfl: 2   atenolol (TENORMIN) 25 MG tablet, 1/2 tab po qd (Patient taking differently: Take 25 mg by mouth daily.), Disp: 90 tablet, Rfl: 4   docusate sodium (COLACE) 100 MG capsule, Take 1 capsule (100 mg total) by mouth 2 (two) times daily as needed for mild constipation., Disp: , Rfl: 0   LORazepam (ATIVAN) 0.5 MG tablet, May take 1 tablet (0.5 mg total) by mouth every 6 (six) hours as needed for anxiety or sleep. May also take 2 tablets (1 mg total) at bedtime as needed for anxiety or sleep., Disp: 10 tablet, Rfl: 0   metroNIDAZOLE (METROGEL) 0.75 % gel, Apply 1 application topically 2 (two) times daily., Disp: , Rfl:    nitroGLYCERIN (NITROSTAT) 0.4 MG SL tablet, Place 1 tablet (0.4 mg total) under the tongue every 5 (five) minutes as needed  for chest pain., Disp: 25 tablet, Rfl: 3   ondansetron (ZOFRAN) 4 MG tablet, Take 1 tablet (4 mg total) by mouth every 6 (six) hours as needed for nausea or vomiting., Disp: 20 tablet, Rfl: 0   polyethylene glycol (MIRALAX / GLYCOLAX) 17 g packet, Take 17 g by mouth daily as needed for moderate constipation or severe constipation., Disp: , Rfl: 0   Propylene Glycol (SYSTANE COMPLETE) 0.6 % SOLN, Apply 1 drop to eye daily as needed (dry eyes)., Disp: , Rfl:   Allergies  Allergen Reactions   Doxycycline Other (See Comments)    Eye irritation   Fluorometholone Other (See Comments)    Eye irritation due to preservatives   Other     Antibiotics - causes GI upset   Pilocarpine Other (See Comments)    Caused blood pressure to drop so low they almost had to call EMS        Objective:  Physical Exam  General: AAO x3, NAD  Dermatological: Thick hyperkeratotic lesion present at the distal aspect the right fourth toe.  Upon debridement there is macerated tissue there is no skin breakdown.  There is no probing.  No drainage or pus.  Mild surrounding erythema likely more from inflammation as opposed to infection.  No ascending cellulitis.  No drainage or pus.  Minimal hyperkeratotic tissue on  the fifth toes.  No ulcerations underlying.  Mild incurvation present to the hallux toenails without any signs of infection.  No edema, erythema.  Vascular: Dorsalis Pedis artery and Posterior Tibial artery pedal pulses are 2/4 bilateral with immedate capillary fill time. There is no pain with calf compression, swelling, warmth, erythema.   Neruologic: Grossly intact via light touch bilateral.  Musculoskeletal: Hammertoes present.  Muscular strength 5/5 in all groups tested bilateral.  Gait: Unassisted, Nonantalgic.       Assessment:   88 year old female with symptomatic hyperkeratotic lesions likely from hammertoes; ingrown toenail     Plan:  -Treatment options discussed including all alternatives,  risks, and complications -Etiology of symptoms were discussed -X-rays were obtained and reviewed with the patient.  No evidence of acute fracture, osteomyelitis identified today. -Sharply debrided the thick hyperkeratotic lesion on the right fourth toe without any complications or bleeding.  Given the small mount of macerated tissue I applied Betadine.  Recommended daily dressing changes.  Prescribed mupirocin ointment.  Offloading pads were dispensed.  If there is any worsening or if there is any signs of infection to let me know immediately. -Debrided the hallux nails with any complications or bleeding to remove the minimal ingrowing of the nail.  If symptoms continue may need to proceed with partial nail avulsion.    Trula Slade DPM

## 2021-04-08 DIAGNOSIS — M1990 Unspecified osteoarthritis, unspecified site: Secondary | ICD-10-CM | POA: Diagnosis not present

## 2021-04-08 DIAGNOSIS — I2510 Atherosclerotic heart disease of native coronary artery without angina pectoris: Secondary | ICD-10-CM | POA: Diagnosis not present

## 2021-04-08 DIAGNOSIS — I10 Essential (primary) hypertension: Secondary | ICD-10-CM | POA: Diagnosis not present

## 2021-04-08 DIAGNOSIS — M810 Age-related osteoporosis without current pathological fracture: Secondary | ICD-10-CM | POA: Diagnosis not present

## 2021-04-19 ENCOUNTER — Ambulatory Visit: Payer: Medicare Other | Admitting: Podiatry

## 2021-04-27 DIAGNOSIS — U071 COVID-19: Secondary | ICD-10-CM | POA: Diagnosis not present

## 2021-05-09 DIAGNOSIS — H3530 Unspecified macular degeneration: Secondary | ICD-10-CM | POA: Diagnosis not present

## 2021-05-09 DIAGNOSIS — H0288A Meibomian gland dysfunction right eye, upper and lower eyelids: Secondary | ICD-10-CM | POA: Diagnosis not present

## 2021-05-09 DIAGNOSIS — H0288B Meibomian gland dysfunction left eye, upper and lower eyelids: Secondary | ICD-10-CM | POA: Diagnosis not present

## 2021-05-09 DIAGNOSIS — Z961 Presence of intraocular lens: Secondary | ICD-10-CM | POA: Diagnosis not present

## 2021-05-09 DIAGNOSIS — L718 Other rosacea: Secondary | ICD-10-CM | POA: Diagnosis not present

## 2021-05-14 DIAGNOSIS — H0288A Meibomian gland dysfunction right eye, upper and lower eyelids: Secondary | ICD-10-CM | POA: Diagnosis not present

## 2021-05-14 DIAGNOSIS — H0288B Meibomian gland dysfunction left eye, upper and lower eyelids: Secondary | ICD-10-CM | POA: Diagnosis not present

## 2021-05-14 DIAGNOSIS — Z961 Presence of intraocular lens: Secondary | ICD-10-CM | POA: Diagnosis not present

## 2021-05-14 DIAGNOSIS — L718 Other rosacea: Secondary | ICD-10-CM | POA: Diagnosis not present

## 2021-05-14 DIAGNOSIS — B0052 Herpesviral keratitis: Secondary | ICD-10-CM | POA: Diagnosis not present

## 2021-05-14 DIAGNOSIS — H3530 Unspecified macular degeneration: Secondary | ICD-10-CM | POA: Diagnosis not present

## 2021-06-14 DIAGNOSIS — Z01419 Encounter for gynecological examination (general) (routine) without abnormal findings: Secondary | ICD-10-CM | POA: Diagnosis not present

## 2021-06-14 DIAGNOSIS — Z90711 Acquired absence of uterus with remaining cervical stump: Secondary | ICD-10-CM | POA: Diagnosis not present

## 2021-06-14 DIAGNOSIS — N393 Stress incontinence (female) (male): Secondary | ICD-10-CM | POA: Diagnosis not present

## 2021-06-14 DIAGNOSIS — R143 Flatulence: Secondary | ICD-10-CM | POA: Diagnosis not present

## 2021-06-14 DIAGNOSIS — Z01411 Encounter for gynecological examination (general) (routine) with abnormal findings: Secondary | ICD-10-CM | POA: Diagnosis not present

## 2021-06-14 DIAGNOSIS — Z124 Encounter for screening for malignant neoplasm of cervix: Secondary | ICD-10-CM | POA: Diagnosis not present

## 2021-06-14 DIAGNOSIS — Z681 Body mass index (BMI) 19 or less, adult: Secondary | ICD-10-CM | POA: Diagnosis not present

## 2021-06-14 DIAGNOSIS — M810 Age-related osteoporosis without current pathological fracture: Secondary | ICD-10-CM | POA: Diagnosis not present

## 2021-06-28 ENCOUNTER — Ambulatory Visit: Payer: Medicare Other | Admitting: Podiatry

## 2021-07-03 DIAGNOSIS — M19072 Primary osteoarthritis, left ankle and foot: Secondary | ICD-10-CM | POA: Diagnosis not present

## 2021-07-03 DIAGNOSIS — M792 Neuralgia and neuritis, unspecified: Secondary | ICD-10-CM | POA: Diagnosis not present

## 2021-07-03 DIAGNOSIS — M21611 Bunion of right foot: Secondary | ICD-10-CM | POA: Diagnosis not present

## 2021-07-03 DIAGNOSIS — M19071 Primary osteoarthritis, right ankle and foot: Secondary | ICD-10-CM | POA: Diagnosis not present

## 2021-07-03 DIAGNOSIS — M2042 Other hammer toe(s) (acquired), left foot: Secondary | ICD-10-CM | POA: Diagnosis not present

## 2021-07-03 DIAGNOSIS — M2041 Other hammer toe(s) (acquired), right foot: Secondary | ICD-10-CM | POA: Diagnosis not present

## 2021-07-03 DIAGNOSIS — M21612 Bunion of left foot: Secondary | ICD-10-CM | POA: Diagnosis not present

## 2021-07-03 DIAGNOSIS — I70203 Unspecified atherosclerosis of native arteries of extremities, bilateral legs: Secondary | ICD-10-CM | POA: Diagnosis not present

## 2021-08-17 NOTE — Patient Outreach (Signed)
Received a referral notification for Ms. Eble. I have assigned Joellyn Quails, RN to call for follow up and determine if there are any Case Management needs.    [place] [facility], [place], [place] Management [phone]

## 2021-09-05 ENCOUNTER — Other Ambulatory Visit: Payer: Self-pay | Admitting: *Deleted

## 2021-09-05 ENCOUNTER — Encounter: Payer: Self-pay | Admitting: *Deleted

## 2021-09-05 NOTE — Patient Outreach (Addendum)
[facility]) Care Management Telephonic RN Care Manager Note   09/05/2021 Name:  Ashley Washington MRN:  [id number] DOB:  1933/01/26  Summary: Successfully reached pt at [phone] She confirms she believes she has received information on [facility] services but does not feel she needs any services at this time  eligible but prefers not to participate  Recommendations/Changes made from today's visit: Initial outreach and case closure as pt prefers She reports she will review the letter mailed to her with [facility] brochure  and call back if needed  [facility] & external pharmacy/Cm vendors services discussed  Letter sent with brochure to provide information for consideration of engagement with [facility]  Case closure   Subjective: Ashley Washington is an 88 y.o. year old female who is a primary patient of Crist Infante, MD. The care management team was consulted for assistance with care management and/or care coordination needs.    Telephonic RN Care Manager completed Telephone Visit today.   Objective:  Medications Reviewed Today     Reviewed by Gean Maidens, CRNA (Certified Registered Nurse Anesthetist) on 04/15/20 at [phone]  Med List Status: Complete   Medication Order Taking? Sig Documenting Provider Last Dose Status Informant  atenolol (TENORMIN) 25 MG tablet [id number] Yes 1/2 tab po qd  Patient taking differently: Take 25 mg by mouth daily.   Lelon Perla, MD 04/13/2020 0700-0800 Active   LORazepam (ATIVAN) 0.5 MG tablet [id number] Yes 1/4 of tablet as needed  Patient taking differently: Take 0.5 mg by mouth at bedtime as needed for sleep.   Lelon Perla, MD 04/12/2020 Unknown time Active Self  metroNIDAZOLE (METROGEL) 0.75 % gel [id number] Yes Apply 1 application topically 2 (two) times daily. [provider] 04/12/2020 Unknown time Active Self  nitroGLYCERIN (NITROSTAT) 0.4 MG SL tablet [id number] Yes Place 1 tablet (0.4 mg total) under the tongue every 5 (five) minutes  as needed for chest pain. Erlene Quan, PA-C not used Expired 10/30/18 2359 Self  Propylene Glycol (SYSTANE COMPLETE) 0.6 % SOLN [id number] Yes Apply 1 drop to eye daily as needed (dry eyes). [provider] 04/12/2020 Unknown time Active Self  traMADol (ULTRAM) 50 MG tablet [id number] Yes Take 50 mg by mouth 3 (three) times daily as needed for moderate pain. [provider] 04/13/2020 Unknown time Active Self             SDOH:  (Social Determinants of Health) assessments and interventions performed:    Care Plan  Review of patient past medical history, allergies, medications, health status, including review of consultants reports, laboratory and other test data, was performed as part of comprehensive evaluation for care management services.   There are no care plans that you recently modified to display for this patient.    Plan: The patient has been provided with contact information for the care management team and has been advised to call with any health related questions or concerns.  Case closure  Shaiden Aldous L. Lavina Hamman, RN, BSN, [place] Coordinator Office number [phone] [facility] number [phone] Fax number [phone]

## 2021-09-19 DIAGNOSIS — M25522 Pain in left elbow: Secondary | ICD-10-CM | POA: Diagnosis not present

## 2021-09-19 DIAGNOSIS — S41112A Laceration without foreign body of left upper arm, initial encounter: Secondary | ICD-10-CM | POA: Diagnosis not present

## 2021-10-26 DIAGNOSIS — L718 Other rosacea: Secondary | ICD-10-CM | POA: Diagnosis not present

## 2021-10-26 DIAGNOSIS — B0052 Herpesviral keratitis: Secondary | ICD-10-CM | POA: Diagnosis not present

## 2021-10-26 DIAGNOSIS — H353132 Nonexudative age-related macular degeneration, bilateral, intermediate dry stage: Secondary | ICD-10-CM | POA: Diagnosis not present

## 2021-10-26 DIAGNOSIS — Z961 Presence of intraocular lens: Secondary | ICD-10-CM | POA: Diagnosis not present

## 2021-10-26 DIAGNOSIS — H0288A Meibomian gland dysfunction right eye, upper and lower eyelids: Secondary | ICD-10-CM | POA: Diagnosis not present

## 2021-10-26 DIAGNOSIS — H0288B Meibomian gland dysfunction left eye, upper and lower eyelids: Secondary | ICD-10-CM | POA: Diagnosis not present

## 2021-11-16 DIAGNOSIS — M546 Pain in thoracic spine: Secondary | ICD-10-CM | POA: Diagnosis not present

## 2022-01-17 DIAGNOSIS — L720 Epidermal cyst: Secondary | ICD-10-CM | POA: Diagnosis not present

## 2022-01-17 DIAGNOSIS — Z85828 Personal history of other malignant neoplasm of skin: Secondary | ICD-10-CM | POA: Diagnosis not present

## 2022-01-17 DIAGNOSIS — L570 Actinic keratosis: Secondary | ICD-10-CM | POA: Diagnosis not present

## 2022-01-17 DIAGNOSIS — D1801 Hemangioma of skin and subcutaneous tissue: Secondary | ICD-10-CM | POA: Diagnosis not present

## 2022-01-17 DIAGNOSIS — L821 Other seborrheic keratosis: Secondary | ICD-10-CM | POA: Diagnosis not present

## 2022-01-17 DIAGNOSIS — D225 Melanocytic nevi of trunk: Secondary | ICD-10-CM | POA: Diagnosis not present

## 2022-01-24 DIAGNOSIS — F419 Anxiety disorder, unspecified: Secondary | ICD-10-CM | POA: Diagnosis not present

## 2022-01-24 DIAGNOSIS — I10 Essential (primary) hypertension: Secondary | ICD-10-CM | POA: Diagnosis not present

## 2022-01-24 DIAGNOSIS — E559 Vitamin D deficiency, unspecified: Secondary | ICD-10-CM | POA: Diagnosis not present

## 2022-01-24 DIAGNOSIS — R7989 Other specified abnormal findings of blood chemistry: Secondary | ICD-10-CM | POA: Diagnosis not present

## 2022-01-24 DIAGNOSIS — R5383 Other fatigue: Secondary | ICD-10-CM | POA: Diagnosis not present

## 2022-02-05 DIAGNOSIS — I341 Nonrheumatic mitral (valve) prolapse: Secondary | ICD-10-CM | POA: Diagnosis not present

## 2022-02-05 DIAGNOSIS — I10 Essential (primary) hypertension: Secondary | ICD-10-CM | POA: Diagnosis not present

## 2022-02-05 DIAGNOSIS — M48061 Spinal stenosis, lumbar region without neurogenic claudication: Secondary | ICD-10-CM | POA: Diagnosis not present

## 2022-02-05 DIAGNOSIS — Z23 Encounter for immunization: Secondary | ICD-10-CM | POA: Diagnosis not present

## 2022-02-05 DIAGNOSIS — M791 Myalgia, unspecified site: Secondary | ICD-10-CM | POA: Diagnosis not present

## 2022-02-05 DIAGNOSIS — Z0000 Encounter for general adult medical examination without abnormal findings: Secondary | ICD-10-CM | POA: Diagnosis not present

## 2022-02-05 DIAGNOSIS — K449 Diaphragmatic hernia without obstruction or gangrene: Secondary | ICD-10-CM | POA: Diagnosis not present

## 2022-02-05 DIAGNOSIS — I517 Cardiomegaly: Secondary | ICD-10-CM | POA: Diagnosis not present

## 2022-02-05 DIAGNOSIS — F419 Anxiety disorder, unspecified: Secondary | ICD-10-CM | POA: Diagnosis not present

## 2022-02-05 DIAGNOSIS — M810 Age-related osteoporosis without current pathological fracture: Secondary | ICD-10-CM | POA: Diagnosis not present

## 2022-02-05 DIAGNOSIS — R82998 Other abnormal findings in urine: Secondary | ICD-10-CM | POA: Diagnosis not present

## 2022-02-05 DIAGNOSIS — I700 Atherosclerosis of aorta: Secondary | ICD-10-CM | POA: Diagnosis not present

## 2022-02-05 DIAGNOSIS — D696 Thrombocytopenia, unspecified: Secondary | ICD-10-CM | POA: Diagnosis not present

## 2022-03-28 DIAGNOSIS — H04123 Dry eye syndrome of bilateral lacrimal glands: Secondary | ICD-10-CM | POA: Diagnosis not present

## 2022-03-28 DIAGNOSIS — H5789 Other specified disorders of eye and adnexa: Secondary | ICD-10-CM | POA: Diagnosis not present

## 2022-04-15 DIAGNOSIS — M1990 Unspecified osteoarthritis, unspecified site: Secondary | ICD-10-CM | POA: Diagnosis not present

## 2022-04-15 DIAGNOSIS — F419 Anxiety disorder, unspecified: Secondary | ICD-10-CM | POA: Diagnosis not present

## 2022-04-15 DIAGNOSIS — I2510 Atherosclerotic heart disease of native coronary artery without angina pectoris: Secondary | ICD-10-CM | POA: Diagnosis not present

## 2022-04-15 DIAGNOSIS — R011 Cardiac murmur, unspecified: Secondary | ICD-10-CM | POA: Diagnosis not present

## 2022-04-29 ENCOUNTER — Telehealth: Payer: Self-pay

## 2022-04-29 NOTE — Telephone Encounter (Signed)
Called pt to move back her appointment, No answer, left detailed voicemail.

## 2022-04-30 ENCOUNTER — Ambulatory Visit: Payer: Medicare Other | Attending: Cardiology | Admitting: Cardiology

## 2022-05-06 ENCOUNTER — Ambulatory Visit (HOSPITAL_COMMUNITY)
Admission: RE | Admit: 2022-05-06 | Discharge: 2022-05-06 | Disposition: A | Discharge status: Home / Self Care | Payer: Medicare Other | Source: Ambulatory Visit | Attending: Physician Assistant | Admitting: Physician Assistant

## 2022-05-06 ENCOUNTER — Inpatient Hospital Stay (HOSPITAL_COMMUNITY)
Admission: RE | Admit: 2022-05-06 | Discharge: 2022-05-06 | Disposition: A | Discharge status: Home / Self Care | Payer: Medicare Other | Source: Ambulatory Visit | Attending: Physician Assistant | Admitting: Physician Assistant

## 2022-05-06 ENCOUNTER — Encounter (HOSPITAL_COMMUNITY): Payer: Self-pay | Admitting: Physician Assistant

## 2022-05-06 VITALS — BP 132/78 | HR 53 | Ht 64.0 in | Wt 104.4 lb

## 2022-05-06 DIAGNOSIS — I480 Paroxysmal atrial fibrillation: Secondary | ICD-10-CM | POA: Insufficient documentation

## 2022-05-06 DIAGNOSIS — D6869 Other thrombophilia: Secondary | ICD-10-CM | POA: Insufficient documentation

## 2022-05-06 DIAGNOSIS — I10 Essential (primary) hypertension: Secondary | ICD-10-CM | POA: Insufficient documentation

## 2022-05-06 DIAGNOSIS — I2510 Atherosclerotic heart disease of native coronary artery without angina pectoris: Secondary | ICD-10-CM | POA: Diagnosis not present

## 2022-05-06 DIAGNOSIS — Z7901 Long term (current) use of anticoagulants: Secondary | ICD-10-CM | POA: Diagnosis not present

## 2022-05-06 DIAGNOSIS — I4891 Unspecified atrial fibrillation: Secondary | ICD-10-CM

## 2022-05-06 DIAGNOSIS — I440 Atrioventricular block, first degree: Secondary | ICD-10-CM | POA: Insufficient documentation

## 2022-05-06 DIAGNOSIS — I700 Atherosclerosis of aorta: Secondary | ICD-10-CM | POA: Diagnosis not present

## 2022-05-06 DIAGNOSIS — Z79899 Other long term (current) drug therapy: Secondary | ICD-10-CM | POA: Diagnosis not present

## 2022-05-06 LAB — CBC
HCT: 42.8 % (ref 36.0–46.0)
Hemoglobin: 13.7 g/dL (ref 12.0–15.0)
MCH: 30.5 pg (ref 26.0–34.0)
MCHC: 32 g/dL (ref 30.0–36.0)
MCV: 95.3 fL (ref 80.0–100.0)
Platelets: 133 10*3/uL — ABNORMAL LOW (ref 150–400)
RBC: 4.49 MIL/uL (ref 3.87–5.11)
RDW: 12.7 % (ref 11.5–15.5)
WBC: 4.3 10*3/uL (ref 4.0–10.5)
nRBC: 0 % (ref 0.0–0.2)

## 2022-05-06 LAB — BASIC METABOLIC PANEL
Anion gap: 10 (ref 5–15)
BUN: 19 mg/dL (ref 8–23)
CO2: 26 mmol/L (ref 22–32)
Calcium: 9.2 mg/dL (ref 8.9–10.3)
Chloride: 106 mmol/L (ref 98–111)
Creatinine, Ser: 0.62 mg/dL (ref 0.44–1.00)
GFR, Estimated: >60 mL/min (ref >60)
Glucose, Bld: 105 mg/dL — ABNORMAL HIGH (ref 70–99)
Potassium: 4.1 mmol/L (ref 3.5–5.1)
Sodium: 142 mmol/L (ref 135–145)

## 2022-05-06 MED ORDER — APIXABAN 2.5 MG PO TABS: 2.5000 mg | ORAL_TABLET | Freq: Two times a day (BID) | ORAL | 3 refills | Status: AC

## 2022-05-06 NOTE — Progress Notes (Signed)
Primary Care Physician: Crist Infante, MD Primary Cardiologist: Dr Stanford Breed Primary Electrophysiologist: none Referring Physician: Dr Tracie Harrier is a 89 y.o. female with a history of fibromuscular dysplasia, renal artery stenosis, aortic atherosclerosis, HTN, MVP, pulmonary HTN, atrial fibrillation who presents for consultation in the [facility].  The patient was initially diagnosed with atrial fibrillation 04/2022 on her Apple Watch. Patient has a CHADS2VASC score of 5. Apple Watch strips personally reviewed today. Most strips show SR with PACs however there is at least one strip that does show true afib, rate controlled. Patient does admit she has been under considerable stress with her husbands health recently.   Today, she denies symptoms of palpitations, chest pain, shortness of breath, orthopnea, PND, lower extremity edema, dizziness, presyncope, syncope, snoring, daytime somnolence, bleeding, or neurologic sequela. The patient is tolerating medications without difficulties and is otherwise without complaint today.    Atrial Fibrillation Risk Factors:  she does not have symptoms or diagnosis of sleep apnea. she does have a history of rheumatic fever.   she has a BMI of Body mass index is 17.92 kg/m.Marland Kitchen Filed Weights   05/06/22 1324  Weight: 47.4 kg    Family History  Problem Relation Age of Onset   Breast cancer Paternal Aunt    Breast cancer Paternal Aunt    Colon cancer Neg Hx      Atrial Fibrillation Management history:  Previous antiarrhythmic drugs: none Previous cardioversions: none Previous ablations: none CHADS2VASC score: 5 Anticoagulation history: none   Past Medical History:  Diagnosis Date   Cancer ([place])    Fibromuscular dysplasia of renal artery ([place])    Hypertension    Mitral valve prolapse    Thyroid disease    Past Surgical History:  Procedure Laterality Date   ABDOMINAL HYSTERECTOMY      APPENDECTOMY     INTRAMEDULLARY (IM) NAIL INTERTROCHANTERIC Right 04/15/2020   Procedure: INTRAMEDULLARY (IM) NAIL INTERTROCHANTRIC;  Surgeon: Rod Can, MD;  Location: [facility];  Service: Orthopedics;  Laterality: Right;   PARATHYROIDECTOMY     TONSILLECTOMY      Current Outpatient Medications  Medication Sig Dispense Refill   acetaminophen (TYLENOL) 325 MG tablet Take 1,000 mg by mouth daily.     atenolol (TENORMIN) 25 MG tablet 1/2 tab po qd (Patient taking differently: Take 25 mg by mouth daily.) 90 tablet 4   Cholecalciferol 25 MCG (1000 UT) capsule Take 1,000 Units by mouth every Monday, Wednesday, and Friday.     FLUoxetine (PROZAC) 10 MG tablet Take 10 mg by mouth daily.     LORazepam (ATIVAN) 1 MG tablet TAKE 1/2-1 TABLET UP TO TWICE A DAY AS NEEDED FOR ANXIETY OR INSOMNIA     metroNIDAZOLE (METROGEL) 0.75 % gel Apply 1 application topically 2 (two) times daily.     Multiple Vitamin (MULTIVITAMIN) capsule 1 capsule Once Daily.     mupirocin ointment (BACTROBAN) 2 % Apply 1 application topically daily. 30 g 2   Propylene Glycol (SYSTANE COMPLETE) 0.6 % SOLN Apply 1 drop to eye daily as needed (dry eyes).     TURMERIC CURCUMIN PO daily.     valACYclovir (VALTREX) 1000 MG tablet Take by mouth.     vitamin C (ASCORBIC ACID) 250 MG tablet Take 250 mg by mouth daily.     nitroGLYCERIN (NITROSTAT) 0.4 MG SL tablet Place 1 tablet (0.4 mg total) under the tongue every 5 (five) minutes as needed for chest pain. 25 tablet  3   No current facility-administered medications for this encounter.    Allergies  Allergen Reactions   Doxycycline Other (See Comments)    Eye irritation   Fluorometholone Other (See Comments)    Eye irritation due to preservatives   Other     Antibiotics - causes GI upset   Pilocarpine Other (See Comments)    Caused blood pressure to drop so low they almost had to call EMS    Social History   Socioeconomic History   Marital status: Married    Spouse name:  Not on file   Number of children: Not on file   Years of education: Not on file   Highest education level: Not on file  Occupational History   Not on file  Tobacco Use   Smoking status: Never   Smokeless tobacco: Never   Tobacco comments:    Never smoke 05/06/22  Vaping Use   Vaping Use: Never used  Substance and Sexual Activity   Alcohol use: Yes    Comment: social   Drug use: Never   Sexual activity: Not on file  Other Topics Concern   Not on file  Social History Narrative   Not on file   Social Determinants of Health   Financial Resource Strain: Low Risk  (09/05/2021)   Overall Financial Resource Strain (CARDIA)    Difficulty of Paying Living Expenses: Not hard at all  Food Insecurity: No Food Insecurity (09/05/2021)   Hunger Vital Sign    Worried About Running Out of Food in the Last Year: Never true    Ran Out of Food in the Last Year: Never true  Transportation Needs: No Transportation Needs (09/05/2021)   PRAPARE - Hydrologist (Medical): No    Lack of Transportation (Non-Medical): No  Physical Activity: Not on file  Stress: Not on file  Social Connections: Not on file  Intimate Partner Violence: Not on file     ROS- All systems are reviewed and negative except as per the HPI above.  Physical Exam: Vitals:   05/06/22 1324  BP: 132/78  Pulse: (!) 53  Weight: 47.4 kg  Height: '5\' 4"'$  (1.626 m)    GEN- The patient is a well appearing elderly female, alert and oriented x 3 today.   Head- normocephalic, atraumatic Eyes-  Sclera clear, conjunctiva pink Ears- hearing intact Oropharynx- clear Neck- supple  Lungs- Clear to ausculation bilaterally, normal work of breathing Heart- Regular rate and rhythm, bradycardia, no murmurs, rubs or gallops  GI- soft, NT, ND, + BS Extremities- no clubbing, cyanosis, or edema MS- no significant deformity or atrophy Skin- no rash or lesion Psych- euthymic mood, full affect Neuro- strength and  sensation are intact  Wt Readings from Last 3 Encounters:  05/06/22 47.4 kg  04/14/20 50.7 kg  04/09/20 48.9 kg    EKG today demonstrates  SB, 1st degree AV block Vent. rate 53 BPM PR interval 254 ms QRS duration 82 ms QT/QTcB 458/429 ms  Echo 04/14/20 demonstrated  1. Left ventricular ejection fraction, by estimation, is 60 to 65%. The  left ventricle has normal function. The left ventricle has no regional  wall motion abnormalities. There is mild left ventricular hypertrophy.  Left ventricular diastolic parameters are consistent with Grade I diastolic dysfunction (impaired relaxation).   2. Right ventricular systolic function is normal. The right ventricular  size is normal. There is severely elevated pulmonary artery systolic  pressure. The estimated right ventricular systolic pressure is  60.2 mmHg.   3. The pericardial effusion is circumferential.   4. The mitral valve is normal in structure. Trivial mitral valve  regurgitation. No evidence of mitral stenosis.   5. The aortic valve is normal in structure. Aortic valve regurgitation is  not visualized. No aortic stenosis is present.   6. The inferior vena cava is normal in size with greater than 50%  respiratory variability, suggesting right atrial pressure of 3 mmHg.   Comparison(s): No significant change from prior study. Prior images  reviewed side by side.   Epic records are reviewed at length today   CHA2DS2-VASc Score = 5  The patient's score is based upon: CHF History: 0 HTN History: 1 Diabetes History: 0 Stroke History: 0 Vascular Disease History: 1 Age Score: 2 Gender Score: 1       ASSESSMENT AND PLAN: 1. Paroxysmal Atrial Fibrillation (ICD10:  I48.0) The patient's CHA2DS2-VASc score is 5, indicating a 7.2% annual risk of stroke.   General education about afib provided and questions answered. We also discussed her stroke risk and the risks and benefits of anticoagulation. At least one Apple Watch strip  does show true afib. Will have her wear a Zio monitor to assess arrhythmia burden. Will start Eliquis 2.5 mg BID (age, weight) Check bmet/cbc Continue atenolol 25 mg daily   2. Secondary Hypercoagulable State (ICD10:  D68.69) The patient is at significant risk for stroke/thromboembolism based upon her CHA2DS2-VASc Score of 5.  Start Apixaban (Eliquis).   3. HTN Stable, no changes today.  4. CAD/aortic atherosclerosis No anginal symptoms. Defer statin to PCP and primary cardiologist.    Follow up in the AF clinic in one month. Will refer to reestablish care with Dr Stanford Breed per patient request.     Adline Peals PA-C [facility] [address] [place], [place] [address] [phone] 05/06/2022 1:33 PM

## 2022-05-06 NOTE — Patient Instructions (Signed)
Start Eliquis 2.'5mg'$  twice a day

## 2022-05-14 DIAGNOSIS — H903 Sensorineural hearing loss, bilateral: Secondary | ICD-10-CM | POA: Diagnosis not present

## 2022-05-14 DIAGNOSIS — Z822 Family history of deafness and hearing loss: Secondary | ICD-10-CM | POA: Diagnosis not present

## 2022-05-14 DIAGNOSIS — H9313 Tinnitus, bilateral: Secondary | ICD-10-CM | POA: Diagnosis not present

## 2022-05-22 ENCOUNTER — Encounter (HOSPITAL_COMMUNITY): Payer: Self-pay

## 2022-05-22 ENCOUNTER — Other Ambulatory Visit (HOSPITAL_COMMUNITY): Payer: Self-pay | Admitting: *Deleted

## 2022-05-22 DIAGNOSIS — I480 Paroxysmal atrial fibrillation: Secondary | ICD-10-CM

## 2022-05-27 DIAGNOSIS — I4891 Unspecified atrial fibrillation: Secondary | ICD-10-CM | POA: Diagnosis not present

## 2022-05-27 NOTE — Addendum Note (Signed)
Encounter addended by: Juluis Mire, RN on: 05/27/2022 1:38 PM  Actions taken: Imaging Exam ended

## 2022-05-30 ENCOUNTER — Telehealth: Payer: Self-pay | Admitting: Cardiology

## 2022-05-30 NOTE — Telephone Encounter (Signed)
Patient calling in to talk to the nurse, in regards to her wearing the heart montior. Please advise

## 2022-05-30 NOTE — Telephone Encounter (Signed)
Patient would like to come in to see Dr Stanford Breed for her heart monitor results next week.  Her daughter will be here next week to bring her in.  Advised schedule full at this time and looked for NP spot but also full.  I advised I would send the message to see tf nurse had any more insight to availability and that we could also call her with results as well.

## 2022-05-31 ENCOUNTER — Encounter (HOSPITAL_COMMUNITY): Payer: Self-pay | Admitting: *Deleted

## 2022-06-04 ENCOUNTER — Ambulatory Visit (HOSPITAL_COMMUNITY): Payer: Medicare Other | Admitting: Physician Assistant

## 2022-06-14 NOTE — Telephone Encounter (Signed)
Patient is following up due to not hearing back from anyone. 

## 2022-06-14 NOTE — Telephone Encounter (Signed)
Left message to call back  

## 2022-06-18 NOTE — Telephone Encounter (Signed)
Spoke with pt, Follow up scheduled  

## 2022-06-18 NOTE — Telephone Encounter (Signed)
Left message for pt to call.

## 2022-06-24 NOTE — Progress Notes (Signed)
HPI: FU palpitations, fibromuscular dysplasia of her right renal artery, and chest pain. Myoview in October of 2010 showed normal LV function and normal perfusion. Her last renal Dopplers in August of 2012 showed 1-59% on the right and normal left. Large cyst in left kidney. Echocardiogram February 2022 showed normal LV function, mild left ventricular hypertrophy, grade 1 diastolic dysfunction, severe pulmonary hypertension.  Monitor March 2024 showed sinus rhythm, PACs and PVCs.  Patient diagnosed with atrial fibrillation February 2024.  Since last seen, she denies increased dyspnea or exertional chest pain.  She occasionally feels "tired in her chest".  She has not had palpitations or syncope.  She is unsteady at times but has not fallen.  Current Outpatient Medications  Medication Sig Dispense Refill   acetaminophen (TYLENOL) 325 MG tablet Take 1,000 mg by mouth daily.     atenolol (TENORMIN) 25 MG tablet 1/2 tab po qd (Patient taking differently: Take 25 mg by mouth daily.) 90 tablet 4   Cholecalciferol 25 MCG (1000 UT) capsule Take 1,000 Units by mouth every Monday, Wednesday, and Friday.     LORazepam (ATIVAN) 1 MG tablet TAKE 1/2-1 TABLET UP TO TWICE A DAY AS NEEDED FOR ANXIETY OR INSOMNIA     metroNIDAZOLE (METROGEL) 0.75 % gel Apply 1 application topically 2 (two) times daily.     Multiple Vitamin (MULTIVITAMIN) capsule 1 capsule Once Daily.     mupirocin ointment (BACTROBAN) 2 % Apply 1 application topically daily. 30 g 2   Propylene Glycol (SYSTANE COMPLETE) 0.6 % SOLN Apply 1 drop to eye daily as needed (dry eyes).     TURMERIC CURCUMIN PO daily.     vitamin C (ASCORBIC ACID) 250 MG tablet Take 250 mg by mouth daily.     apixaban (ELIQUIS) 2.5 MG TABS tablet Take 1 tablet (2.5 mg total) by mouth 2 (two) times daily. (Patient not taking: Reported on 07/04/2022) 60 tablet 3   FLUoxetine (PROZAC) 10 MG tablet Take 10 mg by mouth daily. (Patient not taking: Reported on 07/04/2022)      nitroGLYCERIN (NITROSTAT) 0.4 MG SL tablet Place 1 tablet (0.4 mg total) under the tongue every 5 (five) minutes as needed for chest pain. 25 tablet 3   valACYclovir (VALTREX) 1000 MG tablet Take by mouth. (Patient not taking: Reported on 07/04/2022)     No current facility-administered medications for this visit.     Past Medical History:  Diagnosis Date   Cancer    Fibromuscular dysplasia of renal artery    Hypertension    Mitral valve prolapse    Thyroid disease     Past Surgical History:  Procedure Laterality Date   ABDOMINAL HYSTERECTOMY     APPENDECTOMY     INTRAMEDULLARY (IM) NAIL INTERTROCHANTERIC Right 04/15/2020   Procedure: INTRAMEDULLARY (IM) NAIL INTERTROCHANTRIC;  Surgeon: Samson Frederic, MD;  Location: [facility];  Service: Orthopedics;  Laterality: Right;   PARATHYROIDECTOMY     TONSILLECTOMY      Social History   Socioeconomic History   Marital status: Widowed    Spouse name: Not on file   Number of children: Not on file   Years of education: Not on file   Highest education level: Not on file  Occupational History   Not on file  Tobacco Use   Smoking status: Never   Smokeless tobacco: Never   Tobacco comments:    Never smoke 05/06/22  Vaping Use   Vaping Use: Never used  Substance and Sexual Activity  Alcohol use: Yes    Comment: social   Drug use: Never   Sexual activity: Not on file  Other Topics Concern   Not on file  Social History Narrative   Not on file   Social Determinants of Health   Financial Resource Strain: Low Risk  (09/05/2021)   Overall Financial Resource Strain (CARDIA)    Difficulty of Paying Living Expenses: Not hard at all  Food Insecurity: No Food Insecurity (09/05/2021)   Hunger Vital Sign    Worried About Running Out of Food in the Last Year: Never true    Ran Out of Food in the Last Year: Never true  Transportation Needs: No Transportation Needs (09/05/2021)   PRAPARE - Administrator, Civil Service (Medical):  No    Lack of Transportation (Non-Medical): No  Physical Activity: Not on file  Stress: Not on file  Social Connections: Not on file  Intimate Partner Violence: Not on file    Family History  Problem Relation Age of Onset   Breast cancer Paternal Aunt    Breast cancer Paternal Aunt    Colon cancer Neg Hx     ROS: no fevers or chills, productive cough, hemoptysis, dysphasia, odynophagia, melena, hematochezia, dysuria, hematuria, rash, seizure activity, orthopnea, PND, pedal edema, claudication. Remaining systems are negative.  Physical Exam: Well-developed well-nourished in no acute distress.  Skin is warm and dry.  HEENT is normal.  Neck is supple.  Chest is clear to auscultation with normal expansion.  Cardiovascular exam is regular rate and rhythm.  Abdominal exam nontender or distended. No masses palpated. Extremities show no edema. neuro grossly intact   A/P  1 paroxysmal atrial fibrillation-continue beta-blocker for rate control if atrial fibrillation recurs.  We had a long discussion today concerning risk of embolic event associated with atrial fibrillation.  She is very hesitant as she had a brother who had intracranial hemorrhage on blood thinner.  She also has some fall risk.  I explained the risk of embolic event including CVA.  For now she would like to defer anticoagulation and understands the risk of CVA.  She will contact us if she changes her mind.  I also discussed Watchman device but she is concerned about the risk of the procedure and also does not wish to pursue that at present.  2 palpitations-continue beta-blocker.  3 hypertension-blood pressure controlled.  Continue present medications.  4 renal fibromuscular dysplasia-blood pressure is controlled.  5 pulmonary hypertension-will repeat echocardiogram at next office visit.  Olga Millers, MD

## 2022-07-01 DIAGNOSIS — L718 Other rosacea: Secondary | ICD-10-CM | POA: Diagnosis not present

## 2022-07-01 DIAGNOSIS — H0288B Meibomian gland dysfunction left eye, upper and lower eyelids: Secondary | ICD-10-CM | POA: Diagnosis not present

## 2022-07-01 DIAGNOSIS — H0288A Meibomian gland dysfunction right eye, upper and lower eyelids: Secondary | ICD-10-CM | POA: Diagnosis not present

## 2022-07-01 DIAGNOSIS — Z961 Presence of intraocular lens: Secondary | ICD-10-CM | POA: Diagnosis not present

## 2022-07-01 DIAGNOSIS — H3530 Unspecified macular degeneration: Secondary | ICD-10-CM | POA: Diagnosis not present

## 2022-07-04 ENCOUNTER — Encounter: Payer: Self-pay | Admitting: Cardiology

## 2022-07-04 ENCOUNTER — Ambulatory Visit: Payer: Medicare Other | Attending: Cardiology | Admitting: Cardiology

## 2022-07-04 VITALS — BP 137/61 | HR 91 | Ht 64.0 in | Wt 102.2 lb

## 2022-07-04 DIAGNOSIS — R002 Palpitations: Secondary | ICD-10-CM | POA: Diagnosis not present

## 2022-07-04 DIAGNOSIS — I480 Paroxysmal atrial fibrillation: Secondary | ICD-10-CM | POA: Insufficient documentation

## 2022-07-04 DIAGNOSIS — I10 Essential (primary) hypertension: Secondary | ICD-10-CM | POA: Diagnosis not present

## 2022-07-04 NOTE — Patient Instructions (Signed)
    Follow-Up: At  [facility], you and your health needs are our priority.  As part of our continuing mission to provide you with exceptional heart care, we have created designated Provider Care Teams.  These Care Teams include your primary Cardiologist (physician) and Advanced Practice Providers (APPs -  Physician Assistants and Nurse Practitioners) who all work together to provide you with the care you need, when you need it.  We recommend signing up for the patient portal called "MyChart".  Sign up information is provided on this After Visit Summary.  MyChart is used to connect with patients for Virtual Visits (Telemedicine).  Patients are able to view lab/test results, encounter notes, upcoming appointments, etc.  Non-urgent messages can be sent to your provider as well.   To learn more about what you can do with MyChart, go to [email or web address].    Your next appointment:   6 month(s)  Provider:   Brian Crenshaw, MD      

## 2022-07-30 DIAGNOSIS — F419 Anxiety disorder, unspecified: Secondary | ICD-10-CM | POA: Diagnosis not present

## 2022-07-30 DIAGNOSIS — R011 Cardiac murmur, unspecified: Secondary | ICD-10-CM | POA: Diagnosis not present

## 2022-07-30 DIAGNOSIS — R002 Palpitations: Secondary | ICD-10-CM | POA: Diagnosis not present

## 2022-07-30 DIAGNOSIS — N939 Abnormal uterine and vaginal bleeding, unspecified: Secondary | ICD-10-CM | POA: Diagnosis not present

## 2022-07-30 DIAGNOSIS — I2510 Atherosclerotic heart disease of native coronary artery without angina pectoris: Secondary | ICD-10-CM | POA: Diagnosis not present

## 2022-07-30 DIAGNOSIS — I10 Essential (primary) hypertension: Secondary | ICD-10-CM | POA: Diagnosis not present

## 2022-08-01 ENCOUNTER — Telehealth: Payer: Self-pay | Admitting: Cardiology

## 2022-08-01 DIAGNOSIS — I272 Pulmonary hypertension, unspecified: Secondary | ICD-10-CM

## 2022-08-01 NOTE — Telephone Encounter (Signed)
Patient is requesting to switch from Dr. Jens Som to Dr. Antoine Poche. Please advise.

## 2022-08-02 NOTE — Telephone Encounter (Signed)
Spoke with pt, it was suggested by dr perini that she see dr hochrein and have an echo. She wants a second option regarding what is going on with her. Aware will discuss with dr hochrein and let her know.

## 2022-08-07 NOTE — Telephone Encounter (Signed)
Left message for pt to call to schedule appointment with dr hochrein

## 2022-08-07 NOTE — Telephone Encounter (Signed)
Unable to reach pt or leave a message  

## 2022-08-07 NOTE — Telephone Encounter (Signed)
Called pt from the referral queue to sch with Dr. Antoine Poche. She states "I dont want to see another doctor right now, I just want to have the echo scheduled now and see the doctor after. I like Dr. Jens Som, I think I just had brain fog and was confused since he didn't want to order an echo that day I saw him."  She asked if Dr. Jens Som can order the echo and and see her after.

## 2022-08-08 NOTE — Telephone Encounter (Signed)
Spoke with pt and daughter, they feel like the last appointment with dr Jens Som was under whelming. The patient has been notified by her watch that she has had atrial fib but they were unable to pull up the strips for him to see. She did wear a 2 weeks monitor that did not show atrial fib. Dr Shella Spearing wanted the patient to start taking eliquis but the patients brother died from a brain bleed that they were told was caused by eliquis. They voiced understanding that eliquis will not make you bleed but if there is a reason to bleed it will be worse from the eliquis. They also report the patients blood pressure has been running high and her pulse has been as low as 30 bpm by their report. They are going to send blood pressure and pulse log to [place].  The patient reports a pressure in her head and feeling foggy. She also report SOB when trying to take her walk. The patient is wanting to see dr hochrein for a second option and get an echo prior to that appointment. The first available echo appointment given to the patient but she would like to see if the hospital has something sooner. Aware will call and find out and call them back.

## 2022-08-08 NOTE — Telephone Encounter (Signed)
Spoke with pt and daughter, she will have her echocardiogram at [facility] 08/20/22 @ 8 am. Follow up scheduled next available with dr hochrein.

## 2022-08-20 ENCOUNTER — Ambulatory Visit (HOSPITAL_COMMUNITY)
Admission: RE | Admit: 2022-08-20 | Discharge: 2022-08-20 | Disposition: A | Discharge status: Home / Self Care | Payer: Medicare Other | Source: Ambulatory Visit | Attending: Cardiology | Admitting: Cardiology

## 2022-08-20 DIAGNOSIS — I083 Combined rheumatic disorders of mitral, aortic and tricuspid valves: Secondary | ICD-10-CM | POA: Insufficient documentation

## 2022-08-20 DIAGNOSIS — I10 Essential (primary) hypertension: Secondary | ICD-10-CM | POA: Diagnosis not present

## 2022-08-20 DIAGNOSIS — I272 Pulmonary hypertension, unspecified: Secondary | ICD-10-CM | POA: Insufficient documentation

## 2022-08-20 LAB — ECHOCARDIOGRAM COMPLETE
AR max vel: 1.47 cm2
AV Area VTI: 1.47 cm2
AV Area mean vel: 1.51 cm2
AV Mean grad: 7 mmHg
AV Peak grad: 14.1 mmHg
Ao pk vel: 1.88 m/s
Area-P 1/2: 1.68 cm2
Calc EF: 63.6 %
MV M vel: 5.49 m/s
MV Peak grad: 120.4 mmHg
MV Vena cont: 0.2 cm
P 1/2 time: 2164 msec
Radius: 0.3 cm
S' Lateral: 1.6 cm
Single Plane A2C EF: 61.9 %
Single Plane A4C EF: 66.2 %

## 2022-08-27 NOTE — Progress Notes (Unsigned)
Cardiology Office Note:   Date:  08/29/2022  ID:  Ashley Washington, DOB [birth year], MRN [id number] PCP: Rodrigo Ran, MD  [facility] Providers Cardiologist:  Olga Millers, MD {  History of Present Illness:   Ashley Washington is a 89 y.o. female with fibromuscular dysplasia of her right renal artery, and chest pain. Myoview in October of 2010 showed normal LV function and normal perfusion. Her last renal Dopplers in August of 2012 showed 1-59% on the right and normal left.  Echocardiogram February 2022 showed normal LV function, mild left ventricular hypertrophy, grade 1 diastolic dysfunction, severe pulmonary hypertension.  Monitor March 2024 showed sinus rhythm, PACs and PVCs.  The patient was diagnosed with atrial fibrillation in February 2024.    This was on a monitor in March.    Reviewing the [facility] from [facility] and previous notes I do not see clearly documented atrial fibrillation.  There were runs of supraventricular tachycardia and atrial tachycardia that could have been atrial fibs on some home strips reportedly.  Her monitor demonstrates brief runs of 17 beats of atrial tachycardia that could have been atrial fib.  However at this time she was having significant stress as her husband was very sick and dying.  He did in March.  She said since that time she has not felt any palpitations.  Her watch has not alerted her or alarmed.  She is not having any presyncope or syncope.  She is not having any chest discomfort.  She has some tightness and tiredness.  She did have an echocardiogram.  She had some mild to moderate pulmonary regurgitation and tricuspid regurgitation.  She has some mild mitral regurgitation.  Her ejection fraction was well-preserved.  I reviewed separately the monitor and the echo for this.       ROS: As stated in the HPI and negative for all other systems.  Studies Reviewed:    EKG:        Risk Assessment/Calculations:               Physical Exam:   VS:  BP 122/78 (BP Location: Left Arm, Patient Position: Sitting, Cuff Size: Normal)   Pulse 60   Ht 5\' 4"  (1.626 m)   Wt 103 lb (46.7 kg)   SpO2 99%   BMI 17.68 kg/m    Wt Readings from Last 3 Encounters:  08/29/22 103 lb (46.7 kg)  07/04/22 102 lb 3.2 oz (46.4 kg)  05/06/22 104 lb 6.4 oz (47.4 kg)     GEN: Well nourished, well developed in no acute distress NECK: No JVD; No carotid bruits CARDIAC: RRR, 2/6 apical systolic murmur at the apex, no diastolic murmurs, rubs, gallops RESPIRATORY:  Clear to auscultation without rales, wheezing or rhonchi  ABDOMEN: Soft, non-tender, non-distended EXTREMITIES:  No edema; No deformity   ASSESSMENT AND PLAN:   Atrial fib: We had a long discussion about this.  She has had some atrial tachycardia which could be very brief runs of atrial fibs but her watch has not alerted her recently.  The burden was very low.  They would prefer not to be on anticoagulation.  Her CHA2DS2-VASc score would be 4.  But again she has patient preference not to be on anticoagulation.  I told her it is very reasonable to follow this again for about 4 months using her wearable and they are been optimized to use.  If however there is a burden according to current guidelines the recommendations would  be to be on Eliquis.  I be happy to review this in the future and we will put it on for 10-month follow-up but she will alert me if she is having increasing atrial high rate for evidence of atrial fibrillation in the meantime.       Follow up me in six months.   Signed, Rollene Rotunda, MD

## 2022-08-28 DIAGNOSIS — R1907 Generalized intra-abdominal and pelvic swelling, mass and lump: Secondary | ICD-10-CM | POA: Diagnosis not present

## 2022-08-28 DIAGNOSIS — R1900 Intra-abdominal and pelvic swelling, mass and lump, unspecified site: Secondary | ICD-10-CM | POA: Diagnosis not present

## 2022-08-28 DIAGNOSIS — N938 Other specified abnormal uterine and vaginal bleeding: Secondary | ICD-10-CM | POA: Diagnosis not present

## 2022-08-28 DIAGNOSIS — N950 Postmenopausal bleeding: Secondary | ICD-10-CM | POA: Diagnosis not present

## 2022-08-29 ENCOUNTER — Encounter: Payer: Self-pay | Admitting: Cardiology

## 2022-08-29 ENCOUNTER — Ambulatory Visit: Payer: Medicare Other | Attending: Cardiology | Admitting: Cardiology

## 2022-08-29 VITALS — BP 122/78 | HR 60 | Ht 64.0 in | Wt 103.0 lb

## 2022-08-29 DIAGNOSIS — I480 Paroxysmal atrial fibrillation: Secondary | ICD-10-CM | POA: Insufficient documentation

## 2022-08-29 NOTE — Patient Instructions (Signed)
Medication Instructions:  Your physician recommends that you continue on your current medications as directed. Please refer to the Current Medication list given to you today.  *If you need a refill on your cardiac medications before your next appointment, please call your pharmacy*  Follow-Up: At [facility], you and your health needs are our priority.  As part of our continuing mission to provide you with exceptional heart care, we have created designated Provider Care Teams.  These Care Teams include your primary Cardiologist (physician) and Advanced Practice Providers (APPs -  Physician Assistants and Nurse Practitioners) who all work together to provide you with the care you need, when you need it.  Your next appointment:   6 month(s)  Dr. Antoine Poche

## 2022-09-04 ENCOUNTER — Other Ambulatory Visit (HOSPITAL_BASED_OUTPATIENT_CLINIC_OR_DEPARTMENT_OTHER): Payer: Medicare Other

## 2022-09-04 ENCOUNTER — Other Ambulatory Visit: Payer: Self-pay | Admitting: Obstetrics & Gynecology

## 2022-09-04 DIAGNOSIS — R1900 Intra-abdominal and pelvic swelling, mass and lump, unspecified site: Secondary | ICD-10-CM

## 2022-09-05 DIAGNOSIS — L918 Other hypertrophic disorders of the skin: Secondary | ICD-10-CM | POA: Diagnosis not present

## 2022-09-05 DIAGNOSIS — D225 Melanocytic nevi of trunk: Secondary | ICD-10-CM | POA: Diagnosis not present

## 2022-09-05 DIAGNOSIS — Z85828 Personal history of other malignant neoplasm of skin: Secondary | ICD-10-CM | POA: Diagnosis not present

## 2022-09-05 DIAGNOSIS — L821 Other seborrheic keratosis: Secondary | ICD-10-CM | POA: Diagnosis not present

## 2022-09-05 DIAGNOSIS — L570 Actinic keratosis: Secondary | ICD-10-CM | POA: Diagnosis not present

## 2022-09-27 ENCOUNTER — Ambulatory Visit
Admission: RE | Admit: 2022-09-27 | Discharge: 2022-09-27 | Disposition: A | Discharge status: Home / Self Care | Payer: Medicare Other | Source: Ambulatory Visit | Attending: Obstetrics & Gynecology | Admitting: Obstetrics & Gynecology

## 2022-09-27 DIAGNOSIS — R1900 Intra-abdominal and pelvic swelling, mass and lump, unspecified site: Secondary | ICD-10-CM

## 2022-09-27 DIAGNOSIS — N281 Cyst of kidney, acquired: Secondary | ICD-10-CM | POA: Diagnosis not present

## 2022-09-27 DIAGNOSIS — R194 Change in bowel habit: Secondary | ICD-10-CM | POA: Diagnosis not present

## 2022-09-27 DIAGNOSIS — R109 Unspecified abdominal pain: Secondary | ICD-10-CM | POA: Diagnosis not present

## 2022-09-27 MED ORDER — IOPAMIDOL (ISOVUE-300) INJECTION 61%
80.0000 mL | Freq: Once | INTRAVENOUS | Status: AC | PRN
  Administered 2022-09-27: 80 mL via INTRAVENOUS

## 2022-10-15 ENCOUNTER — Telehealth: Payer: Self-pay

## 2022-10-15 NOTE — Telephone Encounter (Signed)
Spoke with patient about scheduling new patient visit.  She stated that she would be moving on 8/26 to [facility] as he husband just recently passed away and she couldn't stay by herself.  So she is moving to be closer to her daughter.  I contacted Velna Hatchet at the referring office (Dr Juliene Pina @ [facility]) to let her know and she advised me to cancel the referral and she would contact the patient and let her know about getting a new referral once she gets established with her daughter there in [facility].

## 2022-10-21 DIAGNOSIS — F419 Anxiety disorder, unspecified: Secondary | ICD-10-CM | POA: Diagnosis not present

## 2022-10-21 DIAGNOSIS — K589 Irritable bowel syndrome without diarrhea: Secondary | ICD-10-CM | POA: Diagnosis not present

## 2022-10-21 DIAGNOSIS — J439 Emphysema, unspecified: Secondary | ICD-10-CM | POA: Diagnosis not present

## 2022-10-21 DIAGNOSIS — I341 Nonrheumatic mitral (valve) prolapse: Secondary | ICD-10-CM | POA: Diagnosis not present

## 2022-10-21 DIAGNOSIS — M1990 Unspecified osteoarthritis, unspecified site: Secondary | ICD-10-CM | POA: Diagnosis not present

## 2022-10-21 DIAGNOSIS — R002 Palpitations: Secondary | ICD-10-CM | POA: Diagnosis not present

## 2022-10-21 DIAGNOSIS — I2510 Atherosclerotic heart disease of native coronary artery without angina pectoris: Secondary | ICD-10-CM | POA: Diagnosis not present

## 2022-10-21 DIAGNOSIS — I10 Essential (primary) hypertension: Secondary | ICD-10-CM | POA: Diagnosis not present

## 2023-02-19 ENCOUNTER — Telehealth: Payer: Self-pay | Admitting: Cardiology

## 2023-02-19 NOTE — Telephone Encounter (Signed)
Patient identification verified by 2 forms. Marilynn Rail, RN    Called and spoke to patient  Patient states  -she is moving to [place]   -would like a referral for that location   -does not recall the provider Dr. Antoine Poche recommended  Informed patient message sent to provider

## 2023-02-19 NOTE — Telephone Encounter (Signed)
Pt would like a c/b regarding referral to another [facility] being that she is moving. Pt states that she and Dr. Antoine Poche has talked about this she just can't remember the of the provider he recommended. Please advise

## 2023-02-19 NOTE — Telephone Encounter (Signed)
Rollene Rotunda, MD  You16 minutes ago (10:46 AM)    Dr. Maryruth Eve is the name I know.

## 2023-02-19 NOTE — Telephone Encounter (Signed)
Patient identification verified by 2 forms. Marilynn Rail, RN    Called and spoke to patient  RN relayed provider message below  Patient verbalized understanding, no questions at this time

## 2023-02-25 ENCOUNTER — Ambulatory Visit: Payer: Medicare Other | Admitting: Cardiology
